# Patient Record
Sex: Female | Born: 1985 | Race: White | Hispanic: No | Smoking: Current every day smoker
Health system: Southern US, Community
[De-identification: ages and names within clinical notes are randomized; demographics above are authoritative.]

## PROBLEM LIST (undated history)

## (undated) ENCOUNTER — Inpatient Hospital Stay (HOSPITAL_COMMUNITY): Payer: Self-pay

## (undated) DIAGNOSIS — D682 Hereditary deficiency of other clotting factors: Secondary | ICD-10-CM

## (undated) DIAGNOSIS — I2699 Other pulmonary embolism without acute cor pulmonale: Secondary | ICD-10-CM

## (undated) DIAGNOSIS — IMO0002 Reserved for concepts with insufficient information to code with codable children: Secondary | ICD-10-CM

## (undated) DIAGNOSIS — N83209 Unspecified ovarian cyst, unspecified side: Secondary | ICD-10-CM

## (undated) DIAGNOSIS — O039 Complete or unspecified spontaneous abortion without complication: Secondary | ICD-10-CM

## (undated) DIAGNOSIS — F988 Other specified behavioral and emotional disorders with onset usually occurring in childhood and adolescence: Secondary | ICD-10-CM

## (undated) HISTORY — DX: Complete or unspecified spontaneous abortion without complication: O03.9

## (undated) HISTORY — DX: Unspecified ovarian cyst, unspecified side: N83.209

## (undated) HISTORY — PX: TONSILLECTOMY: SUR1361

## (undated) HISTORY — DX: Reserved for concepts with insufficient information to code with codable children: IMO0002

## (undated) HISTORY — PX: WISDOM TOOTH EXTRACTION: SHX21

## (undated) HISTORY — DX: Other specified behavioral and emotional disorders with onset usually occurring in childhood and adolescence: F98.8

---
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
## 2006-02-06 ENCOUNTER — Ambulatory Visit: Payer: Self-pay | Admitting: Cardiology

## 2006-03-13 ENCOUNTER — Ambulatory Visit: Payer: Self-pay | Admitting: Cardiology

## 2006-03-14 ENCOUNTER — Other Ambulatory Visit: Admission: RE | Admit: 2006-03-14 | Discharge: 2006-03-14 | Payer: Self-pay | Admitting: Gynecology

## 2006-09-12 ENCOUNTER — Emergency Department (HOSPITAL_COMMUNITY): Admission: EM | Admit: 2006-09-12 | Discharge: 2006-09-13 | Payer: Self-pay | Admitting: Emergency Medicine

## 2006-09-12 ENCOUNTER — Encounter (INDEPENDENT_AMBULATORY_CARE_PROVIDER_SITE_OTHER): Payer: Self-pay | Admitting: Emergency Medicine

## 2006-09-12 DIAGNOSIS — O039 Complete or unspecified spontaneous abortion without complication: Secondary | ICD-10-CM

## 2006-09-12 HISTORY — DX: Complete or unspecified spontaneous abortion without complication: O03.9

## 2007-01-03 ENCOUNTER — Other Ambulatory Visit: Admission: RE | Admit: 2007-01-03 | Discharge: 2007-01-03 | Payer: Self-pay | Admitting: Gynecology

## 2007-03-17 ENCOUNTER — Other Ambulatory Visit: Admission: RE | Admit: 2007-03-17 | Discharge: 2007-03-17 | Payer: Self-pay | Admitting: Gynecology

## 2007-07-17 ENCOUNTER — Inpatient Hospital Stay (HOSPITAL_COMMUNITY): Admission: AD | Admit: 2007-07-17 | Discharge: 2007-07-17 | Payer: Self-pay | Admitting: Obstetrics and Gynecology

## 2007-11-19 ENCOUNTER — Inpatient Hospital Stay (HOSPITAL_COMMUNITY): Admission: AD | Admit: 2007-11-19 | Discharge: 2007-11-21 | Payer: Self-pay | Admitting: Obstetrics and Gynecology

## 2007-11-28 ENCOUNTER — Encounter: Admission: RE | Admit: 2007-11-28 | Discharge: 2007-12-28 | Payer: Self-pay | Admitting: Obstetrics and Gynecology

## 2007-12-29 ENCOUNTER — Encounter: Admission: RE | Admit: 2007-12-29 | Discharge: 2008-01-27 | Payer: Self-pay | Admitting: Obstetrics and Gynecology

## 2008-01-28 ENCOUNTER — Encounter: Admission: RE | Admit: 2008-01-28 | Discharge: 2008-02-27 | Payer: Self-pay | Admitting: Obstetrics and Gynecology

## 2008-02-28 ENCOUNTER — Encounter: Admission: RE | Admit: 2008-02-28 | Discharge: 2008-03-28 | Payer: Self-pay | Admitting: Obstetrics and Gynecology

## 2008-03-29 ENCOUNTER — Encounter: Admission: RE | Admit: 2008-03-29 | Discharge: 2008-04-28 | Payer: Self-pay | Admitting: Obstetrics and Gynecology

## 2008-04-29 ENCOUNTER — Encounter: Admission: RE | Admit: 2008-04-29 | Discharge: 2008-05-29 | Payer: Self-pay | Admitting: Obstetrics and Gynecology

## 2009-03-07 ENCOUNTER — Other Ambulatory Visit: Admission: RE | Admit: 2009-03-07 | Discharge: 2009-03-07 | Payer: Self-pay | Admitting: Gynecology

## 2009-03-07 ENCOUNTER — Ambulatory Visit: Payer: Self-pay | Admitting: Gynecology

## 2009-05-17 ENCOUNTER — Ambulatory Visit: Payer: Self-pay | Admitting: Gynecology

## 2009-07-13 ENCOUNTER — Ambulatory Visit: Payer: Self-pay | Admitting: Gynecology

## 2009-08-07 HISTORY — PX: AUGMENTATION MAMMAPLASTY: SUR837

## 2009-11-16 ENCOUNTER — Ambulatory Visit: Payer: Self-pay | Admitting: Gynecology

## 2009-11-16 HISTORY — PX: INCISE AND DRAIN ABCESS: PRO64

## 2009-11-17 ENCOUNTER — Ambulatory Visit: Payer: Self-pay | Admitting: Gynecology

## 2010-06-29 ENCOUNTER — Ambulatory Visit (INDEPENDENT_AMBULATORY_CARE_PROVIDER_SITE_OTHER): Payer: BC Managed Care – PPO | Admitting: Otolaryngology

## 2010-06-29 DIAGNOSIS — R070 Pain in throat: Secondary | ICD-10-CM

## 2010-07-27 ENCOUNTER — Ambulatory Visit (INDEPENDENT_AMBULATORY_CARE_PROVIDER_SITE_OTHER): Payer: BC Managed Care – PPO | Admitting: Otolaryngology

## 2010-08-25 NOTE — Letter (Signed)
February 06, 2006    Peggy [name], M.D.  [address]  [place], [place] [address]   RE:  Peggy, [name]  MRN:  [id number]  /  DOB:  April 06, 1986   Dear Peggy [name],   It was my pleasure evaluating Peggy [name] in the office today in consultation  at your request.  As you know, this nice young lady has had trouble with  chronic chest pain over recent months.  This has necessitated 3 emergency  department visits but no specific diagnosis has been established to date.  She describes the sudden onset of sharp, aching and pressure in the  epigastric/lower chest area with difficulty taking a deep breath.  She does  not have true air hunger.  There is no diaphoresis nor nausea.  She has no  additional GI symptoms.  The symptoms generally fade without intervention.  She has been treated with analgesics in the emergency department, often  without benefit.   She has no history of previous cardiac or GI evaluation.  She has been tried  on a number of medications but does not recall the names.  She started  ranitidine a week ago but had recurrence of symptoms on Saturday.  There is  no relationship to exertion.  She previously had symptoms at night, but  these have resolved.  There is no definite relationship to meals.  She does  derive benefit from eating.  There was some thought that caffeine might be a  precipitant.   PAST MEDICAL HISTORY:  Benign.  She has never been hospitalized.  She has  never undergone surgery.  She has no chronic medical illnesses.   She has no known allergies.   Her only medication other than ranitidine a hormonal ring for contraception.   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  She smokes 1/2 pack of cigarettes per day and  denies excessive alcohol or illicit drugs.   FAMILY HISTORY:  Benign.  There is no coronary disease among first-degree  relatives.  There is no history of congenital heart  disease.   REVIEW OF SYSTEMS:  Generally negative.  She has occasional dizziness.  She  notes rare palpitations.  She has stable weight and appetite.   EXAM:  Trim, pleasant young woman in no acute distress.  The weight is 140 pounds.  Blood pressure 115/75.  Heart rate 85 and  regular.  Respirations 16.  HEENT:  Anicteric sclerae; normal lids and conjunctivae.  NECK:  No jugular venous distention; normal carotid upstrokes without  bruits.  ENDOCRINE:  No thyromegaly.  HEMATOPOIETIC:  No adenopathy.  LUNGS:  Clear.  CARDIAC:  Normal first and second heart sounds.  ABDOMEN:  Soft and with minimal epigastric tenderness; normal bowel sounds;  no organomegaly; no masses.  EXTREMITIES:  Normal distal pulses;  no edema.  NEUROMUSCULAR:  Symmetric strength and tone.   EKG:  Normal sinus rhythm; within normal limits.  When compared to a prior  tracing of 1 week ago, shallow T-wave inversions are no longer present.   Emergency department records were obtained and reviewed.  There was no  information regarding her most recent visit.  She was seen in 2006 with  chest discomfort and back pain.  She has not had an EKG in the emergency  department nor any specific testing.   IMPRESSION:  Peggy [name] has benign examination and electrocardiogram.  Her  symptoms are not highly suggestive  of a cardiac etiology.  Obviously, she  has no risks for atherosclerotic  disease.  The etiology of these episodes is more likely gastrointestinal  than cardiac.  She will complete a 47-month course of ranitidine.  If that is  ineffective, she will switch to AcipHex 20 mg b.i.d.  I will reevaluate her  symptoms in 5 weeks.  If she continues to have problems, it might be  worthwhile to obtain gastrointestinal consultation and testing.    Sincerely,      Peggy Friends. Dietrich Pates, MD, [facility]  Electronically Signed    RMR/MedQ  DD: 02/06/2006  DT: 02/06/2006  Job #: [id number]

## 2010-08-25 NOTE — Letter (Signed)
March 13, 2006    Peggy [name], M.D.  [address] [place] Peggy [name], [place] [address]   RE:  Peggy, [name]  MRN:  [id number]  /  DOB:  [birth year]   Dear Peggy [name]:   Peggy [name] returns to the office for continued assessment and treatment  of chest discomfort.  Since her last visit, she has had only 1 episode  lasting approximately 10 minutes.  This was epigastric, stabbing in  quality, and had a minor pleuritic component.  Her current medications  include her contraceptive ring and AcipHex 40 mg b.i.d.   Her exam and EKG remain benign.   IMPRESSION:  Peggy [name] is doing well with treatment for presumed  gastroesophageal reflux disease.  Her dose of AcipHex will be reduced to  40 mg and taken at night, since she experiences mild nausea after she  takes this medication.  I will be happy to see her as needed if she  experiences additional problems with which I can assist.  Thanks for  sending this nice young lady to me.  Influenza vaccine was administered  at her request.    Sincerely,      Peggy Friends. Dietrich Pates, MD, [facility]  Electronically Signed    RMR/MedQ  DD: 03/13/2006  DT: 03/13/2006  Job #: [id number]

## 2011-01-02 LAB — URINALYSIS, ROUTINE W REFLEX MICROSCOPIC
Bilirubin Urine: NEGATIVE
Glucose, UA: 250 — AB
Hgb urine dipstick: NEGATIVE
Ketones, ur: NEGATIVE
Nitrite: NEGATIVE
Protein, ur: NEGATIVE
Specific Gravity, Urine: 1.01
Urobilinogen, UA: 0.2
pH: 6.5

## 2011-01-02 LAB — URINE MICROSCOPIC-ADD ON

## 2011-01-05 LAB — CBC
HCT: 26.9 — ABNORMAL LOW
HCT: 33.9 — ABNORMAL LOW
Hemoglobin: 11.1 — ABNORMAL LOW
Hemoglobin: 8.9 — ABNORMAL LOW
MCHC: 32.8
MCHC: 33
MCV: 84.2
MCV: 85.2
Platelets: 233
Platelets: 276
RBC: 3.16 — ABNORMAL LOW
RBC: 4.02
RDW: 14.9
RDW: 15.1
WBC: 14 — ABNORMAL HIGH
WBC: 14.3 — ABNORMAL HIGH

## 2011-01-05 LAB — RPR: RPR Ser Ql: NONREACTIVE

## 2011-01-25 LAB — I-STAT 8, (EC8 V) (CONVERTED LAB)
Acid-base deficit: 2
BUN: 7
Bicarbonate: 22.5
Chloride: 107
Glucose, Bld: 99
HCT: 41
Hemoglobin: 13.9
Operator id: 132501
Potassium: 3.7
Sodium: 138
TCO2: 24
pCO2, Ven: 37.6 — ABNORMAL LOW
pH, Ven: 7.385 — ABNORMAL HIGH

## 2011-01-25 LAB — HCG, QUANTITATIVE, PREGNANCY: hCG, Beta Chain, Quant, S: 3942 — ABNORMAL HIGH

## 2011-01-25 LAB — PREGNANCY, URINE: Preg Test, Ur: POSITIVE

## 2011-01-25 LAB — ABO/RH: ABO/RH(D): B POS

## 2011-09-13 ENCOUNTER — Ambulatory Visit: Payer: BC Managed Care – PPO | Admitting: Gynecology

## 2011-09-19 ENCOUNTER — Other Ambulatory Visit (HOSPITAL_COMMUNITY)
Admission: RE | Admit: 2011-09-19 | Discharge: 2011-09-19 | Disposition: A | Discharge status: Home / Self Care | Payer: BC Managed Care – PPO | Source: Ambulatory Visit | Attending: Gynecology | Admitting: Gynecology

## 2011-09-19 ENCOUNTER — Ambulatory Visit (INDEPENDENT_AMBULATORY_CARE_PROVIDER_SITE_OTHER): Payer: BC Managed Care – PPO | Admitting: Gynecology

## 2011-09-19 ENCOUNTER — Encounter: Payer: Self-pay | Admitting: Gynecology

## 2011-09-19 VITALS — BP 100/60 | Ht 71.0 in | Wt 136.0 lb

## 2011-09-19 DIAGNOSIS — B9689 Other specified bacterial agents as the cause of diseases classified elsewhere: Secondary | ICD-10-CM

## 2011-09-19 DIAGNOSIS — N760 Acute vaginitis: Secondary | ICD-10-CM

## 2011-09-19 DIAGNOSIS — Z01419 Encounter for gynecological examination (general) (routine) without abnormal findings: Secondary | ICD-10-CM

## 2011-09-19 DIAGNOSIS — A499 Bacterial infection, unspecified: Secondary | ICD-10-CM

## 2011-09-19 DIAGNOSIS — N930 Postcoital and contact bleeding: Secondary | ICD-10-CM

## 2011-09-19 DIAGNOSIS — F988 Other specified behavioral and emotional disorders with onset usually occurring in childhood and adolescence: Secondary | ICD-10-CM | POA: Insufficient documentation

## 2011-09-19 LAB — WET PREP FOR TRICH, YEAST, CLUE
Clue Cells Wet Prep HPF POC: NONE SEEN
Trich, Wet Prep: NONE SEEN
Yeast Wet Prep HPF POC: NONE SEEN

## 2011-09-19 MED ORDER — METRONIDAZOLE 500 MG PO TABS: 500.0000 mg | ORAL_TABLET | Freq: Two times a day (BID) | ORAL | Status: AC

## 2011-09-19 NOTE — Patient Instructions (Signed)
Take Flagyl medication as prescribed.  Consider Stop Smoking.  Help is available at [facility] smoking cessation program @ www.[place].com or (313)260-7014. OR 1-800-QUIT-NOW 804 810 4118) for free smoking cessation counseling.   Smoking Hazards Smoking cigarettes is extremely bad for your health. Tobacco smoke has over 200 known poisons in it. There are over 60 chemicals in tobacco smoke that cause cancer. Some of the chemicals found in cigarette smoke include:  Cyanide.  Benzene.  Formaldehyde.  Methanol (wood alcohol).  Acetylene (fuel used in welding torches).  Ammonia.  Cigarette smoke also contains the poisonous gases nitrogen oxide and carbon monoxide.  Cigarette smokers have an increased risk of many serious medical problems, including: Lung cancer.  Lung disease (such as pneumonia, bronchitis, and emphysema).  Heart attack and chest pain due to the heart not getting enough oxygen (angina).  Heart disease and peripheral blood vessel disease.  Hypertension.  Stroke.  Oral cancer (cancer of the lip, mouth, or voice box).  Bladder cancer.  Pancreatic cancer.  Cervical cancer.  Pregnancy complications, including premature birth.  Low birthweight babies.  Early menopause.  Lower estrogen level for women.  Infertility.  Facial wrinkles.  Blindness.  Increased risk of broken bones (fractures).  Senile dementia.  Stillbirths and smaller newborn babies, birth defects, and genetic damage to sperm.  Stomach ulcers and internal bleeding.  Children of smokers have an increased risk of the following, because of secondhand smoke exposure:  Sudden infant death syndrome (SIDS).  Respiratory infections.  Lung cancer.  Heart disease.  Ear infections.  Smoking causes approximately: 90% of all lung cancer deaths in men.  80% of all lung cancer deaths in women.  90% of deaths from chronic obstructive lung disease.  Compared with nonsmokers, smoking increases the risk  of: Coronary heart disease by 2 to 4 times.  Stroke by 2 to 4 times.  Men developing lung cancer by 23 times.  Women developing lung cancer by 13 times.  Dying from chronic obstructive lung diseases by 12 times.  Someone who smokes 2 packs a day loses about 8 years of his or her life. Even smoking lightly shortens your life expectancy by several years. You can greatly reduce the risk of medical problems for you and your family by stopping now. Smoking is the most preventable cause of death and disease in our society. Within days of quitting smoking, your circulation returns to normal, you decrease the risk of having a heart attack, and your lung capacity improves. There may be some increased phlegm in the first few days after quitting, and it may take months for your lungs to clear up completely. Quitting for 10 years cuts your lung cancer risk to almost that of a nonsmoker. WHY IS SMOKING ADDICTIVE? Nicotine is the chemical agent in tobacco that is capable of causing addiction or dependence.  When you smoke and inhale, nicotine is absorbed rapidly into the bloodstream through your lungs. Nicotine absorbed through the lungs is capable of creating a powerful addiction. Both inhaled and non-inhaled nicotine may be addictive.  Addiction studies of cigarettes and spit tobacco show that addiction to nicotine occurs mainly during the teen years, when young people begin using tobacco products.  WHAT ARE THE BENEFITS OF QUITTING?  There are many health benefits to quitting smoking.  Likelihood of developing cancer and heart disease decreases. Health improvements are seen almost immediately.  Blood pressure, pulse rate, and breathing patterns start returning to normal soon after quitting.  People who quit may see  an improvement in their overall quality of life.  Some people choose to quit all at once. Other options include nicotine replacement products, such as patches, gum, and nasal sprays. Do not use these  products without first checking with your caregiver. QUITTING SMOKING It is not easy to quit smoking. Nicotine is addicting, and longtime habits are hard to change. To start, you can write down all your reasons for quitting, tell your family and friends you want to quit, and ask for their help. Throw your cigarettes away, chew gum or cinnamon sticks, keep your hands busy, and drink extra water or juice. Go for walks and practice deep breathing to relax. Think of all the money you are saving: around $1,000 a year, for the average pack-a-day smoker. Nicotine patches and gum have been shown to improve success at efforts to stop smoking. Zyban (bupropion) is an anti-depressant drug that can be prescribed to reduce nicotine withdrawal symptoms and to suppress the urge to smoke. Smoking is an addiction with both physical and psychological effects. Joining a stop-smoking support group can help you cope with the emotional issues. For more information and advice on programs to stop smoking, call your doctor, your local hospital, or these organizations: American Lung Association - 1-800-LUNGUSA  American Cancer Society - 1-800-ACS-2345  Document Released: 05/03/2004 Document Revised: 12/06/2010 Document Reviewed: 01/05/2009 [facility] Patient Information 2012 [place], [place].  Smoking Cessation This document explains the best ways for you to quit smoking and new treatments to help. It lists new medicines that can double or triple your chances of quitting and quitting for good. It also considers ways to avoid relapses and concerns you may have about quitting, including weight gain. NICOTINE: A POWERFUL ADDICTION If you have tried to quit smoking, you know how hard it can be. It is hard because nicotine is a very addictive drug. For some people, it can be as addictive as heroin or cocaine. Usually, people make 2 or 3 tries, or more, before finally being able to quit. Each time you try to quit, you can learn about what  helps and what hurts. Quitting takes hard work and a lot of effort, but you can quit smoking. QUITTING SMOKING IS ONE OF THE MOST IMPORTANT THINGS YOU WILL EVER DO.  You will live longer, feel better, and live better.   The impact on your body of quitting smoking is felt almost immediately:   Within 20 minutes, blood pressure decreases. Pulse returns to its normal level.   After 8 hours, carbon monoxide levels in the blood return to normal. Oxygen level increases.   After 24 hours, chance of heart attack starts to decrease. Breath, hair, and body stop smelling like smoke.   After 48 hours, damaged nerve endings begin to recover. Sense of taste and smell improve.   After 72 hours, the body is virtually free of nicotine. Bronchial tubes relax and breathing becomes easier.   After 2 to 12 weeks, lungs can hold more air. Exercise becomes easier and circulation improves.   Quitting will reduce your risk of having a heart attack, stroke, cancer, or lung disease:   After 1 year, the risk of coronary heart disease is cut in half.   After 5 years, the risk of stroke falls to the same as a nonsmoker.   After 10 years, the risk of lung cancer is cut in half and the risk of other cancers decreases significantly.   After 15 years, the risk of coronary heart disease drops, usually  to the level of a nonsmoker.   If you are pregnant, quitting smoking will improve your chances of having a healthy baby.   The people you live with, especially your children, will be healthier.   You will have extra money to spend on things other than cigarettes.  FIVE KEYS TO QUITTING Studies have shown that these 5 steps will help you quit smoking and quit for good. You have the best chances of quitting if you use them together: 1. Get ready.  2. Get support and encouragement.  3. Learn new skills and behaviors.  4. Get medicine to reduce your nicotine addiction and use it correctly.  5. Be prepared for relapse  or difficult situations. Be determined to continue trying to quit, even if you do not succeed at first.  1. GET READY  Set a quit date.   Change your environment.   Get rid of ALL cigarettes, ashtrays, matches, and lighters in your home, car, and place of work.   Do not let people smoke in your home.   Review your past attempts to quit. Think about what worked and what did not.   Once you quit, do not smoke. NOT EVEN A PUFF!  2. GET SUPPORT AND ENCOURAGEMENT Studies have shown that you have a better chance of being successful if you have help. You can get support in many ways.  Tell your family, friends, and coworkers that you are going to quit and need their support. Ask them not to smoke around you.   Talk to your caregivers (doctor, dentist, nurse, pharmacist, psychologist, and/or smoking counselor).   Get individual, group, or telephone counseling and support. The more counseling you have, the better your chances are of quitting. Programs are available at [employer] and health centers. Call your local health department for information about programs in your area.   Spiritual beliefs and practices may help some smokers quit.   Quit meters are Photographer that keep track of quit statistics, such as amount of "quit-time," cigarettes not smoked, and money saved.   Many smokers find one or more of the many self-help books available useful in helping them quit and stay off tobacco.  3. LEARN NEW SKILLS AND BEHAVIORS  Try to distract yourself from urges to smoke. Talk to someone, go for a walk, or occupy your time with a task.   When you first try to quit, change your routine. Take a different route to work. Drink tea instead of coffee. Eat breakfast in a different place.   Do something to reduce your stress. Take a hot bath, exercise, or read a book.   Plan something enjoyable to do every day. Reward yourself for not smoking.   Explore  interactive web-based programs that specialize in helping you quit.  4. GET MEDICINE AND USE IT CORRECTLY Medicines can help you stop smoking and decrease the urge to smoke. Combining medicine with the above behavioral methods and support can quadruple your chances of successfully quitting smoking. The U.S. Food and Drug Administration (FDA) has approved 7 medicines to help you quit smoking. These medicines fall into 3 categories.  Nicotine replacement therapy (delivers nicotine to your body without the negative effects and risks of smoking):   Nicotine gum: Available over-the-counter.   Nicotine lozenges: Available over-the-counter.   Nicotine inhaler: Available by prescription.   Nicotine nasal spray: Available by prescription.   Nicotine skin patches (transdermal): Available by prescription and over-the-counter.   Antidepressant medicine (  helps people abstain from smoking, but how this works is unknown):   Bupropion sustained-release (SR) tablets: Available by prescription.   Nicotinic receptor partial agonist (simulates the effect of nicotine in your brain):   Varenicline tartrate tablets: Available by prescription.   Ask your caregiver for advice about which medicines to use and how to use them. Carefully read the information on the package.   Everyone who is trying to quit may benefit from using a medicine. If you are pregnant or trying to become pregnant, nursing an infant, you are under age 18, or you smoke fewer than 10 cigarettes per day, talk to your caregiver before taking any nicotine replacement medicines.   You should stop using a nicotine replacement product and call your caregiver if you experience nausea, dizziness, weakness, vomiting, fast or irregular heartbeat, mouth problems with the lozenge or gum, or redness or swelling of the skin around the patch that does not go away.   Do not use any other product containing nicotine while using a nicotine replacement  product.   Talk to your caregiver before using these products if you have diabetes, heart disease, asthma, stomach ulcers, you had a recent heart attack, you have high blood pressure that is not controlled with medicine, a history of irregular heartbeat, or you have been prescribed medicine to help you quit smoking.  5. BE PREPARED FOR RELAPSE OR DIFFICULT SITUATIONS  Most relapses occur within the first 3 months after quitting. Do not be discouraged if you start smoking again. Remember, most people try several times before they finally quit.   You may have symptoms of withdrawal because your body is used to nicotine. You may crave cigarettes, be irritable, feel very hungry, cough often, get headaches, or have difficulty concentrating.   The withdrawal symptoms are only temporary. They are strongest when you first quit, but they will go away within 10 to 14 days.  Here are some difficult situations to watch for:  Alcohol. Avoid drinking alcohol. Drinking lowers your chances of successfully quitting.   Caffeine. Try to reduce the amount of caffeine you consume. It also lowers your chances of successfully quitting.   Other smokers. Being around smoking can make you want to smoke. Avoid smokers.   Weight gain. Many smokers will gain weight when they quit, usually less than 10 pounds. Eat a healthy diet and stay active. Do not let weight gain distract you from your main goal, quitting smoking. Some medicines that help you quit smoking may also help delay weight gain. You can always lose the weight gained after you quit.   Bad mood or depression. There are a lot of ways to improve your mood other than smoking.  If you are having problems with any of these situations, talk to your caregiver. SPECIAL SITUATIONS AND CONDITIONS Studies suggest that everyone can quit smoking. Your situation or condition can give you a special reason to quit.  Pregnant women/new mothers: By quitting, you protect your  baby's health and your own.   Hospitalized patients: By quitting, you reduce health problems and help healing.   Heart attack patients: By quitting, you reduce your risk of a second heart attack.   Lung, head, and neck cancer patients: By quitting, you reduce your chance of a second cancer.   Parents of children and adolescents: By quitting, you protect your children from illnesses caused by secondhand smoke.  QUESTIONS TO THINK ABOUT Think about the following questions before you try to stop smoking. You may   want to talk about your answers with your caregiver.  Why do you want to quit?   If you tried to quit in the past, what helped and what did not?   What will be the most difficult situations for you after you quit? How will you plan to handle them?   Who can help you through the tough times? Your family? Friends? Caregiver?   What pleasures do you get from smoking? What ways can you still get pleasure if you quit?  Here are some questions to ask your caregiver:  How can you help me to be successful at quitting?   What medicine do you think would be best for me and how should I take it?   What should I do if I need more help?   What is smoking withdrawal like? How can I get information on withdrawal?  Quitting takes hard work and a lot of effort, but you can quit smoking. FOR MORE INFORMATION  Smokefree.gov (http://www.davis-sullivan.com/) provides free, accurate, evidence-based information and professional assistance to help support the immediate and long-term needs of people trying to quit smoking. Document Released: 03/20/2001 Document Revised: 12/06/2010 Document Reviewed: 01/10/2009 [facility] Patient Information 2012 [place], [place].

## 2011-09-19 NOTE — Progress Notes (Signed)
VERSA CRATON 03/07/1986 [id number]        26 y.o.  for annual exam.  Several issues noted below.  Past medical history,surgical history, medications, allergies, family history and social history were all reviewed and documented in the EPIC chart. ROS:  Was performed and pertinent positives and negatives are included in the history.  Exam: [name] chaperone present Filed Vitals:   09/19/11 1128  BP: 100/60   General appearance  Normal Skin grossly normal, Implanon rod palpated upper left inner arm Head/Neck normal with no cervical or supraclavicular adenopathy thyroid normal Lungs  clear Cardiac RR, without RMG Abdominal  soft, nontender, without masses, organomegaly or hernia Breasts  examined lying and sitting without masses, retractions, discharge or axillary adenopathy. Bilateral augmentation noted. Pelvic  Ext/BUS/vagina  normal   Cervix  normal Pap, GC Chlamydia, wet prep  Uterus  axial, normal size, shape and contour, midline and mobile nontender   Adnexa  Without masses or tenderness    Anus and perineum  normal    Assessment/Plan:  26 y.o. female for annual exam.    1. Postcoital bleeding. Patient notes her last several months consistent post coital spotting. No pain or other symptoms.  Regular menses. Wet prep consistent with BV. We'll treat with Flagyl 500 mg twice a day, alcohol avoidance reviewed. GC Chlamydia/Pap smear done. We'll follow up if postcoital bleeding continues. 2. Pap smear.  History of low-grade SIL in 2008. Normal Pap smears 2008 and 2010. Pap smear done today. If normal then plan every 3 year screen per current screening guidelines. 3. Implanon. Patient had Implanon placed February 2011.  Exam with rod easily palpated.  Doing well overall due to be replaced next year. 4. Breast health. Bilateral implants 08/2009. SBE monthly reviewed. 5. Stop smoking encouraged. 6. Health maintenance. Baseline CBC ordered along with her GC Chlamydia, Pap,  urinalysis.    Dara Lords MD, 12:13 PM 09/19/2011

## 2011-09-20 LAB — URINALYSIS W MICROSCOPIC + REFLEX CULTURE
Bacteria, UA: NONE SEEN
Bilirubin Urine: NEGATIVE
Casts: NONE SEEN
Crystals: NONE SEEN
Glucose, UA: NEGATIVE mg/dL
Hgb urine dipstick: NEGATIVE
Ketones, ur: NEGATIVE mg/dL
Leukocytes, UA: NEGATIVE
Nitrite: NEGATIVE
Protein, ur: NEGATIVE mg/dL
Specific Gravity, Urine: 1.015 (ref 1.005–1.030)
Urobilinogen, UA: 0.2 mg/dL (ref 0.0–1.0)
pH: 5.5 (ref 5.0–8.0)

## 2011-09-20 LAB — GC/CHLAMYDIA PROBE AMP, GENITAL
Chlamydia, DNA Probe: NEGATIVE
GC Probe Amp, Genital: NEGATIVE

## 2012-05-19 ENCOUNTER — Ambulatory Visit (INDEPENDENT_AMBULATORY_CARE_PROVIDER_SITE_OTHER): Payer: BC Managed Care – PPO | Admitting: Gynecology

## 2012-05-19 ENCOUNTER — Encounter: Payer: Self-pay | Admitting: Gynecology

## 2012-05-19 DIAGNOSIS — Z3046 Encounter for surveillance of implantable subdermal contraceptive: Secondary | ICD-10-CM

## 2012-05-19 MED ORDER — ETONOGESTREL-ETHINYL ESTRADIOL 0.12-0.015 MG/24HR VA RING: VAGINAL_RING | VAGINAL | Status: DC

## 2012-05-19 NOTE — Patient Instructions (Addendum)
Start on NuvaRing as we discussed. Start on prenatal vitamins as we discussed.  Follow up end of the summer for your annual exam.

## 2012-05-19 NOTE — Progress Notes (Signed)
Patient presents to have her Implanon removed. Was placed February 2011. Planning pregnancy this coming fall. Had used NuvaRing in the past would like to restart now.  I reviewed the Implanon removal procedure and risks. Subsequently her left upper arm was examined with the Implanon rod easily palpable. The area overlying the distal end was cleansed with Betadine, infiltrated with 1% lidocaine and a small skin incision was made. The Implanon rod was moved into the incision site and grasped with a small forceps. The surrounding capsule was sharply incised and the intact Implanon rod easily slid out through the incision and was removed. It was shown to the patient and discarded. A Steri-Strip was placed over the skin incision and a pressure dressing applied. Postoperative instructions were given.  We reviewed contraceptive options and she wants to go ahead with the NuvaRing that she had used in the past. We gave her a sample ring and I refilled her prescription x6 months until her annual exam is due. I stressed the need to start on prenatal vitamins preconceptualy. We also reviewed the need to wean herself off of her medications prior to pregnancy attempt.

## 2012-09-22 ENCOUNTER — Encounter: Payer: BC Managed Care – PPO | Admitting: Gynecology

## 2012-12-26 ENCOUNTER — Encounter: Payer: BC Managed Care – PPO | Admitting: Gynecology

## 2013-01-02 ENCOUNTER — Encounter: Payer: Self-pay | Admitting: Gynecology

## 2013-02-12 ENCOUNTER — Other Ambulatory Visit: Payer: Self-pay

## 2013-03-24 LAB — OB RESULTS CONSOLE RPR: RPR: NONREACTIVE

## 2013-03-24 LAB — OB RESULTS CONSOLE RUBELLA ANTIBODY, IGM: Rubella: IMMUNE

## 2013-03-24 LAB — OB RESULTS CONSOLE HEPATITIS B SURFACE ANTIGEN: Hepatitis B Surface Ag: NEGATIVE

## 2013-03-24 LAB — OB RESULTS CONSOLE HIV ANTIBODY (ROUTINE TESTING): HIV: NONREACTIVE

## 2013-03-24 LAB — OB RESULTS CONSOLE GC/CHLAMYDIA
Chlamydia: NEGATIVE
GC PROBE AMP, GENITAL: NEGATIVE

## 2013-03-24 LAB — OB RESULTS CONSOLE ABO/RH: RH Type: POSITIVE

## 2013-03-24 LAB — OB RESULTS CONSOLE GBS: GBS: NEGATIVE

## 2013-03-24 LAB — OB RESULTS CONSOLE ANTIBODY SCREEN: ANTIBODY SCREEN: NEGATIVE

## 2013-04-09 NOTE — L&D Delivery Note (Signed)
Patient was C/C/+3 and pushed for 5 minutes with epidural.   NSVD female infant, Apgars 8,9, weight P.   The patient had a second degree episiotomy repaired with 2-0 vicryl R. Fundus was firm. EBL was expected. Placenta was delivered intact. Vagina was clear.  Baby was vigorous and doing skin to skin with mother.  Peggy [name] A

## 2013-05-20 ENCOUNTER — Other Ambulatory Visit: Payer: Self-pay

## 2013-05-22 ENCOUNTER — Other Ambulatory Visit (HOSPITAL_COMMUNITY): Payer: Self-pay | Admitting: Obstetrics and Gynecology

## 2013-05-22 DIAGNOSIS — Z3689 Encounter for other specified antenatal screening: Secondary | ICD-10-CM

## 2013-05-22 DIAGNOSIS — O43129 Velamentous insertion of umbilical cord, unspecified trimester: Secondary | ICD-10-CM

## 2013-05-28 ENCOUNTER — Ambulatory Visit (HOSPITAL_COMMUNITY): Admission: RE | Admit: 2013-05-28 | Payer: Medicaid Other | Source: Ambulatory Visit

## 2013-06-02 ENCOUNTER — Ambulatory Visit (HOSPITAL_COMMUNITY): Admission: RE | Admit: 2013-06-02 | Payer: Medicaid Other | Source: Ambulatory Visit

## 2013-06-05 ENCOUNTER — Ambulatory Visit (HOSPITAL_COMMUNITY)
Admission: RE | Admit: 2013-06-05 | Discharge: 2013-06-05 | Disposition: A | Discharge status: Home / Self Care | Payer: Medicaid Other | Source: Ambulatory Visit | Attending: Obstetrics and Gynecology | Admitting: Obstetrics and Gynecology

## 2013-06-05 ENCOUNTER — Encounter (HOSPITAL_COMMUNITY): Payer: Self-pay

## 2013-06-05 DIAGNOSIS — Z363 Encounter for antenatal screening for malformations: Secondary | ICD-10-CM | POA: Insufficient documentation

## 2013-06-05 DIAGNOSIS — O358XX0 Maternal care for other (suspected) fetal abnormality and damage, not applicable or unspecified: Secondary | ICD-10-CM | POA: Insufficient documentation

## 2013-06-05 DIAGNOSIS — O43129 Velamentous insertion of umbilical cord, unspecified trimester: Secondary | ICD-10-CM

## 2013-06-05 DIAGNOSIS — Z3689 Encounter for other specified antenatal screening: Secondary | ICD-10-CM

## 2013-06-05 DIAGNOSIS — Z1389 Encounter for screening for other disorder: Secondary | ICD-10-CM | POA: Insufficient documentation

## 2013-07-21 ENCOUNTER — Encounter (HOSPITAL_COMMUNITY): Payer: Self-pay

## 2013-07-21 ENCOUNTER — Inpatient Hospital Stay (HOSPITAL_COMMUNITY): Payer: Medicaid Other

## 2013-07-21 ENCOUNTER — Inpatient Hospital Stay (HOSPITAL_COMMUNITY)
Admission: AD | Admit: 2013-07-21 | Discharge: 2013-07-21 | Disposition: A | Discharge status: Home / Self Care | Payer: Medicaid Other | Source: Ambulatory Visit | Attending: Obstetrics and Gynecology | Admitting: Obstetrics and Gynecology

## 2013-07-21 DIAGNOSIS — N898 Other specified noninflammatory disorders of vagina: Secondary | ICD-10-CM | POA: Insufficient documentation

## 2013-07-21 DIAGNOSIS — R109 Unspecified abdominal pain: Secondary | ICD-10-CM | POA: Insufficient documentation

## 2013-07-21 DIAGNOSIS — N949 Unspecified condition associated with female genital organs and menstrual cycle: Secondary | ICD-10-CM | POA: Insufficient documentation

## 2013-07-21 DIAGNOSIS — D682 Hereditary deficiency of other clotting factors: Secondary | ICD-10-CM | POA: Insufficient documentation

## 2013-07-21 DIAGNOSIS — O26853 Spotting complicating pregnancy, third trimester: Secondary | ICD-10-CM

## 2013-07-21 DIAGNOSIS — O26859 Spotting complicating pregnancy, unspecified trimester: Secondary | ICD-10-CM | POA: Insufficient documentation

## 2013-07-21 HISTORY — DX: Other pulmonary embolism without acute cor pulmonale: I26.99

## 2013-07-21 HISTORY — DX: Hereditary deficiency of other clotting factors: D68.2

## 2013-07-21 LAB — URINALYSIS, ROUTINE W REFLEX MICROSCOPIC
Bilirubin Urine: NEGATIVE
GLUCOSE, UA: NEGATIVE mg/dL
Hgb urine dipstick: NEGATIVE
KETONES UR: NEGATIVE mg/dL
Leukocytes, UA: NEGATIVE
Nitrite: NEGATIVE
PH: 6.5 (ref 5.0–8.0)
Protein, ur: NEGATIVE mg/dL
Specific Gravity, Urine: 1.01 (ref 1.005–1.030)
Urobilinogen, UA: 0.2 mg/dL (ref 0.0–1.0)

## 2013-07-21 LAB — WET PREP, GENITAL
CLUE CELLS WET PREP: NONE SEEN
Trich, Wet Prep: NONE SEEN
Yeast Wet Prep HPF POC: NONE SEEN

## 2013-07-21 NOTE — MAU Note (Signed)
Patient states she has had dark red spotting in her panties several times today. No blood on tissue when wiping. States she has started having a lot of low pelvic pain that worse with standing and walking. Reports good fetal movement. Denies leaking.

## 2013-07-21 NOTE — Discharge Instructions (Signed)
Vaginal Bleeding During Pregnancy, Third Trimester A small amount of bleeding (spotting) from the vagina is relatively common in pregnancy. Various things can cause bleeding or spotting in pregnancy. Sometimes the bleeding is normal and is not a problem. However, bleeding during the third trimester can also be a sign of something serious for the mother and the baby. Be sure to tell your health care provider about any vaginal bleeding right away.  Some possible causes of vaginal bleeding during the third trimester include:   The placenta may be partially or completely covering the opening to the cervix (placenta previa).   The placenta may have separated from the uterus (abruption of the placenta).   There may be an infection or growth on the cervix.   You may be starting labor, called discharging of the mucus plug.   The placenta may grow into the muscle layer of the uterus (placenta accreta).  HOME CARE INSTRUCTIONS  Watch your condition for any changes. The following actions may help to lessen any discomfort you are feeling:   Follow your health care provider's instructions for limiting your activity. If your health care provider orders bed rest, you may need to stay in bed and only get up to use the bathroom. However, your health care provider may allow you to continue light activity.  If needed, make plans for someone to help with your regular activities and responsibilities while you are on bed rest.  Keep track of the number of pads you use each day, how often you change pads, and how soaked (saturated) they are. Write this down.  Do not use tampons. Do not douche.  Do not have sexual intercourse or orgasms until approved by your health care provider.  Follow your health care provider's advice about lifting, driving, and physical activities.  If you pass any tissue from your vagina, save the tissue so you can show it to your health care provider.   Only take over-the-counter  or prescription medicines as directed by your health care provider.  Do not take aspirin because it can make you bleed.   Keep all follow-up appointments as directed by your health care provider. SEEK MEDICAL CARE IF:  You have any vaginal bleeding during any part of your pregnancy.  You have cramps or labor pains. SEEK IMMEDIATE MEDICAL CARE IF:   You have severe cramps or pain in your back or belly (abdomen).  You have a fever, not controlled by medicine.  You have chills.  You have a gush of fluid from the vagina.  You pass large clots or tissue from your vagina.  Your bleeding increases.  You feel lightheaded or weak.  You pass out.  You feel less movement or no movement of the baby.  MAKE SURE YOU:  Understand these instructions.  Will watch your condition.  Will get help right away if you are not doing well or get worse. Document Released: 06/16/2002 Document Revised: 01/14/2013 Document Reviewed: 12/01/2012 [employer] Patient Information 2014 [employer].  

## 2013-07-21 NOTE — MAU Provider Note (Signed)
Chief Complaint:  Vaginal Discharge and Pelvic Pain   First Provider Initiated Contact with Patient 07/21/13 1731      HPI: Peggy [name] is a 28 y.o. G3P0011 at 46101w1d who presents to maternity admissions reporting sharp pain bilaterally in inguinal areas with movement today and light red spotting enough to require a panty liner starting after the pain.  Her pain and bleeding have resolved by arrival in MAU.  She reports good fetal movement, denies LOF, vaginal itching/burning, urinary symptoms, h/a, dizziness, n/v, or fever/chills.     Past Medical History: Past Medical History  Diagnosis Date  . Labial abscess 11-17-09  . LGSIL (low grade squamous intraepithelial dysplasia) 12-07 &3-08    C&B 3-08/CONDYLOMA TREATED 06-19-06  . Complete miscarriage 09-12-06  . ADD (attention deficit disorder)   . Factor V deficiency   . Pulmonary embolus     Past obstetric history: OB History  Gravida Para Term Preterm AB SAB TAB Ectopic Multiple Living  3 1 1  1 1    1     # Outcome Date GA Lbr Len/2nd Weight Sex Delivery Anes PTL Lv  3 CUR           2 SAB           1 TRM               Past Surgical History: Past Surgical History  Procedure Laterality Date  . Incise and drain abcess  11-16-09    LEFT LABIA  . Augmentation mammaplasty  08/2009  . Tonsillectomy    . Wisdom tooth extraction      Family History: Family History  Problem Relation Age of Onset  . Cancer Maternal Grandmother     skin  . Diabetes Maternal Grandmother   . Hypertension Maternal Grandfather   . Breast cancer Paternal Grandmother   . Cancer Paternal Grandfather     ? colon    Social History: History  Substance Use Topics  . Smoking status: Current Some Day Smoker  . Smokeless tobacco: Never Used  . Alcohol Use: Yes     Comment: once a week    Allergies:  Allergies  Allergen Reactions  . Caffeine Anaphylaxis  . Peanut Butter Flavor Anaphylaxis    Meds:  Prescriptions prior to admission   Medication Sig Dispense Refill  . enoxaparin (LOVENOX) 40 MG/0.4ML injection Inject 40 mg into the skin daily.      . folic acid (FOLVITE) 800 MCG tablet Take 1,600 mcg by mouth daily.        ROS: Pertinent findings in history of present illness.  Physical Exam  Blood pressure 116/78, pulse 100, temperature 98.5 F (36.9 C), temperature source Oral, resp. rate 16, height 5\' 9"  (1.753 m), weight 78.2 kg (172 lb 6.4 oz), last menstrual period 12/29/2012, SpO2 100.00%. GENERAL: Well-developed, well-nourished female in no acute distress.  HEENT: normocephalic HEART: normal rate RESP: normal effort ABDOMEN: Soft, non-tender, gravid appropriate for gestational age EXTREMITIES: Nontender, no edema NEURO: alert and oriented Pelvic exam: Cervix pink, visually closed, without lesion, scant white creamy discharge, vaginal walls and external genitalia normal  Dilation: Closed Effacement (%): Thick Cervical Position: Posterior Station:  (high) Exam by:: L. Leftwich-Kirby CNM  FHT:  Baseline 145 , moderate variability, accelerations present, no decelerations Contractions: none on toco or to palpation   Labs: Results for orders placed during the hospital encounter of 07/21/13 (from the past 24 hour(s))  URINALYSIS, ROUTINE W REFLEX MICROSCOPIC  Status: Abnormal   Collection Time    07/21/13  3:55 PM      Result Value Ref Range   Color, Urine STRAW (*) YELLOW   APPearance CLEAR  CLEAR   Specific Gravity, Urine 1.010  1.005 - 1.030   pH 6.5  5.0 - 8.0   Glucose, UA NEGATIVE  NEGATIVE mg/dL   Hgb urine dipstick NEGATIVE  NEGATIVE   Bilirubin Urine NEGATIVE  NEGATIVE   Ketones, ur NEGATIVE  NEGATIVE mg/dL   Protein, ur NEGATIVE  NEGATIVE mg/dL   Urobilinogen, UA 0.2  0.0 - 1.0 mg/dL   Nitrite NEGATIVE  NEGATIVE   Leukocytes, UA NEGATIVE  NEGATIVE  WET PREP, GENITAL     Status: Abnormal   Collection Time    07/21/13  5:40 PM      Result Value Ref Range   Yeast Wet Prep HPF POC NONE  SEEN  NONE SEEN   Trich, Wet Prep NONE SEEN  NONE SEEN   Clue Cells Wet Prep HPF POC NONE SEEN  NONE SEEN   WBC, Wet Prep HPF POC MANY (*) NONE SEEN    Imaging:  Preliminary report with normal AFI, FHT, no placenta previa or abruption noted, cervical length wnl   Assessment: 1. Spotting complicating pregnancy in third trimester   2. Round ligament pain     Plan: Consult Dr Dareen PianoAnderson Discharge home PTL precautions, bleeding precautions, fetal kick counts F/U in office this week Return to MAU as needed      Follow-up Information   Follow up with [facility] Schedule an appointment as soon as possible for a visit in 1 week.   Contact information:   [address] [place] [address] [phone]       Medication List         enoxaparin 40 MG/0.4ML injection  Commonly known as:  LOVENOX  Inject 40 mg into the skin daily.     folic acid 800 MCG tablet  Commonly known as:  FOLVITE  Take 1,600 mcg by mouth daily.        Sharen CounterLisa Leftwich-Kirby Certified Nurse-Midwife 07/21/2013 7:35 PM

## 2013-08-13 ENCOUNTER — Other Ambulatory Visit: Payer: Self-pay | Admitting: Obstetrics and Gynecology

## 2013-08-13 ENCOUNTER — Ambulatory Visit
Admission: RE | Admit: 2013-08-13 | Discharge: 2013-08-13 | Disposition: A | Discharge status: Home / Self Care | Payer: Medicaid Other | Source: Ambulatory Visit | Attending: Obstetrics and Gynecology | Admitting: Obstetrics and Gynecology

## 2013-08-13 ENCOUNTER — Ambulatory Visit: Admission: RE | Admit: 2013-08-13 | Payer: Medicaid Other | Source: Ambulatory Visit

## 2013-08-13 DIAGNOSIS — R079 Chest pain, unspecified: Secondary | ICD-10-CM

## 2013-08-13 DIAGNOSIS — R0602 Shortness of breath: Secondary | ICD-10-CM

## 2013-08-13 MED ORDER — IOHEXOL 350 MG/ML SOLN
100.0000 mL | Freq: Once | INTRAVENOUS | Status: AC | PRN
  Administered 2013-08-13: 100 mL via INTRAVENOUS

## 2013-09-11 LAB — OB RESULTS CONSOLE GBS: GBS: POSITIVE

## 2013-09-29 ENCOUNTER — Encounter (HOSPITAL_COMMUNITY): Payer: Self-pay | Admitting: *Deleted

## 2013-09-29 ENCOUNTER — Telehealth (HOSPITAL_COMMUNITY): Payer: Self-pay | Admitting: *Deleted

## 2013-09-29 NOTE — Telephone Encounter (Signed)
Preadmission screen  

## 2013-10-04 ENCOUNTER — Inpatient Hospital Stay (HOSPITAL_COMMUNITY)
Admission: AD | Admit: 2013-10-04 | Discharge: 2013-10-04 | Disposition: A | Discharge status: Home / Self Care | Payer: Medicaid Other | Attending: Obstetrics and Gynecology | Admitting: Obstetrics and Gynecology

## 2013-10-04 ENCOUNTER — Encounter (HOSPITAL_COMMUNITY): Payer: Self-pay

## 2013-10-04 ENCOUNTER — Inpatient Hospital Stay (HOSPITAL_COMMUNITY)
Admission: RE | Admit: 2013-10-04 | Discharge: 2013-10-06 | DRG: 775 | Disposition: A | Discharge status: Home / Self Care | Payer: Medicaid Other | Source: Ambulatory Visit | Attending: Obstetrics and Gynecology | Admitting: Obstetrics and Gynecology

## 2013-10-04 ENCOUNTER — Inpatient Hospital Stay (HOSPITAL_COMMUNITY): Payer: Medicaid Other | Admitting: Anesthesiology

## 2013-10-04 ENCOUNTER — Encounter (HOSPITAL_COMMUNITY): Payer: Medicaid Other | Admitting: Anesthesiology

## 2013-10-04 DIAGNOSIS — Z22330 Carrier of Group B streptococcus: Secondary | ICD-10-CM

## 2013-10-04 DIAGNOSIS — O9989 Other specified diseases and conditions complicating pregnancy, childbirth and the puerperium: Secondary | ICD-10-CM

## 2013-10-04 DIAGNOSIS — O9912 Other diseases of the blood and blood-forming organs and certain disorders involving the immune mechanism complicating childbirth: Principal | ICD-10-CM

## 2013-10-04 DIAGNOSIS — D689 Coagulation defect, unspecified: Principal | ICD-10-CM | POA: Diagnosis present

## 2013-10-04 DIAGNOSIS — Z3490 Encounter for supervision of normal pregnancy, unspecified, unspecified trimester: Secondary | ICD-10-CM

## 2013-10-04 DIAGNOSIS — D6859 Other primary thrombophilia: Secondary | ICD-10-CM | POA: Diagnosis present

## 2013-10-04 DIAGNOSIS — O99334 Smoking (tobacco) complicating childbirth: Secondary | ICD-10-CM | POA: Diagnosis present

## 2013-10-04 DIAGNOSIS — O99892 Other specified diseases and conditions complicating childbirth: Secondary | ICD-10-CM | POA: Diagnosis present

## 2013-10-04 DIAGNOSIS — Z86711 Personal history of pulmonary embolism: Secondary | ICD-10-CM

## 2013-10-04 LAB — CBC
HCT: 33 % — ABNORMAL LOW (ref 36.0–46.0)
HCT: 30 % — ABNORMAL LOW (ref 36.0–46.0)
HEMOGLOBIN: 10.7 g/dL — AB (ref 12.0–15.0)
HEMOGLOBIN: 9.9 g/dL — AB (ref 12.0–15.0)
MCH: 27.8 pg (ref 26.0–34.0)
MCH: 28.2 pg (ref 26.0–34.0)
MCHC: 32.4 g/dL (ref 30.0–36.0)
MCHC: 33 g/dL (ref 30.0–36.0)
MCV: 85.7 fL (ref 78.0–100.0)
MCV: 85.5 fL (ref 78.0–100.0)
Platelets: 243 10*3/uL (ref 150–400)
Platelets: 221 10*3/uL (ref 150–400)
RBC: 3.85 MIL/uL — ABNORMAL LOW (ref 3.87–5.11)
RBC: 3.51 MIL/uL — ABNORMAL LOW (ref 3.87–5.11)
RDW: 13.2 % (ref 11.5–15.5)
RDW: 13.2 % (ref 11.5–15.5)
WBC: 10.8 10*3/uL — ABNORMAL HIGH (ref 4.0–10.5)
WBC: 10.8 10*3/uL — ABNORMAL HIGH (ref 4.0–10.5)

## 2013-10-04 LAB — RPR

## 2013-10-04 LAB — CREATININE, SERUM: CREATININE: 0.61 mg/dL (ref 0.50–1.10)

## 2013-10-04 MED ORDER — SENNOSIDES-DOCUSATE SODIUM 8.6-50 MG PO TABS
2.0000 | ORAL_TABLET | ORAL | Status: DC
  Administered 2013-10-05 – 2013-10-06 (×2): 2 via ORAL
  Filled 2013-10-04 (×2): qty 2

## 2013-10-04 MED ORDER — ONDANSETRON HCL 4 MG PO TABS: 4.0000 mg | ORAL_TABLET | ORAL | Status: DC | PRN

## 2013-10-04 MED ORDER — LACTATED RINGERS IV SOLN: 500.0000 mL | Freq: Once | INTRAVENOUS | Status: DC

## 2013-10-04 MED ORDER — OXYCODONE-ACETAMINOPHEN 5-325 MG PO TABS
1.0000 | ORAL_TABLET | ORAL | Status: DC | PRN
Start: 2013-10-04 — End: 2013-10-06
  Administered 2013-10-05 – 2013-10-06 (×5): 1 via ORAL
  Filled 2013-10-04 (×5): qty 1

## 2013-10-04 MED ORDER — PHENYLEPHRINE 40 MCG/ML (10ML) SYRINGE FOR IV PUSH (FOR BLOOD PRESSURE SUPPORT)
80.0000 ug | PREFILLED_SYRINGE | INTRAVENOUS | Status: DC | PRN
  Filled 2013-10-04: qty 10

## 2013-10-04 MED ORDER — LANOLIN HYDROUS EX OINT: TOPICAL_OINTMENT | CUTANEOUS | Status: DC | PRN

## 2013-10-04 MED ORDER — OXYTOCIN 40 UNITS IN LACTATED RINGERS INFUSION - SIMPLE MED
62.5000 mL/h | INTRAVENOUS | Status: DC
  Filled 2013-10-04: qty 1000

## 2013-10-04 MED ORDER — EPHEDRINE 5 MG/ML INJ
10.0000 mg | INTRAVENOUS | Status: DC | PRN
  Filled 2013-10-04: qty 4

## 2013-10-04 MED ORDER — WITCH HAZEL-GLYCERIN EX PADS: 1.0000 "application " | MEDICATED_PAD | CUTANEOUS | Status: DC | PRN

## 2013-10-04 MED ORDER — SODIUM CHLORIDE 0.9 % IJ SOLN: 3.0000 mL | Freq: Two times a day (BID) | INTRAMUSCULAR | Status: DC

## 2013-10-04 MED ORDER — OXYCODONE-ACETAMINOPHEN 5-325 MG PO TABS: 1.0000 | ORAL_TABLET | ORAL | Status: DC | PRN

## 2013-10-04 MED ORDER — ZOLPIDEM TARTRATE 5 MG PO TABS: 5.0000 mg | ORAL_TABLET | Freq: Every evening | ORAL | Status: DC | PRN

## 2013-10-04 MED ORDER — IBUPROFEN 600 MG PO TABS: 600.0000 mg | ORAL_TABLET | Freq: Four times a day (QID) | ORAL | Status: DC | PRN

## 2013-10-04 MED ORDER — SODIUM CHLORIDE 0.9 % IV SOLN: 250.0000 mL | INTRAVENOUS | Status: DC | PRN

## 2013-10-04 MED ORDER — OXYTOCIN BOLUS FROM INFUSION: 500.0000 mL | INTRAVENOUS | Status: DC

## 2013-10-04 MED ORDER — MAGNESIUM HYDROXIDE 400 MG/5ML PO SUSP: 30.0000 mL | ORAL | Status: DC | PRN

## 2013-10-04 MED ORDER — PRENATAL MULTIVITAMIN CH
1.0000 | ORAL_TABLET | Freq: Every day | ORAL | Status: DC
  Administered 2013-10-05 – 2013-10-06 (×2): 1 via ORAL
  Filled 2013-10-04 (×2): qty 1

## 2013-10-04 MED ORDER — CITRIC ACID-SODIUM CITRATE 334-500 MG/5ML PO SOLN: 30.0000 mL | ORAL | Status: DC | PRN

## 2013-10-04 MED ORDER — OXYTOCIN 40 UNITS IN LACTATED RINGERS INFUSION - SIMPLE MED
1.0000 m[IU]/min | INTRAVENOUS | Status: DC
  Administered 2013-10-04: 2 m[IU]/min via INTRAVENOUS
  Filled 2013-10-04: qty 1000

## 2013-10-04 MED ORDER — BENZOCAINE-MENTHOL 20-0.5 % EX AERO
1.0000 "application " | INHALATION_SPRAY | CUTANEOUS | Status: DC | PRN
  Administered 2013-10-04 – 2013-10-06 (×4): 1 via TOPICAL
  Filled 2013-10-04 (×4): qty 56

## 2013-10-04 MED ORDER — FENTANYL 2.5 MCG/ML BUPIVACAINE 1/10 % EPIDURAL INFUSION (WH - ANES)
14.0000 mL/h | INTRAMUSCULAR | Status: DC | PRN
  Filled 2013-10-04: qty 125

## 2013-10-04 MED ORDER — METHYLERGONOVINE MALEATE 0.2 MG/ML IJ SOLN: 0.2000 mg | INTRAMUSCULAR | Status: DC | PRN

## 2013-10-04 MED ORDER — DIPHENHYDRAMINE HCL 25 MG PO CAPS: 25.0000 mg | ORAL_CAPSULE | Freq: Four times a day (QID) | ORAL | Status: DC | PRN

## 2013-10-04 MED ORDER — FLEET ENEMA 7-19 GM/118ML RE ENEM: 1.0000 | ENEMA | RECTAL | Status: DC | PRN

## 2013-10-04 MED ORDER — SODIUM CHLORIDE 0.9 % IJ SOLN: 3.0000 mL | INTRAMUSCULAR | Status: DC | PRN

## 2013-10-04 MED ORDER — ENOXAPARIN SODIUM 40 MG/0.4ML ~~LOC~~ SOLN
40.0000 mg | SUBCUTANEOUS | Status: DC
  Administered 2013-10-04 – 2013-10-05 (×2): 40 mg via SUBCUTANEOUS
  Filled 2013-10-04 (×3): qty 0.4

## 2013-10-04 MED ORDER — PHENYLEPHRINE 40 MCG/ML (10ML) SYRINGE FOR IV PUSH (FOR BLOOD PRESSURE SUPPORT): 80.0000 ug | PREFILLED_SYRINGE | INTRAVENOUS | Status: DC | PRN

## 2013-10-04 MED ORDER — FERROUS SULFATE 325 (65 FE) MG PO TABS
325.0000 mg | ORAL_TABLET | Freq: Two times a day (BID) | ORAL | Status: DC
  Administered 2013-10-04 – 2013-10-06 (×4): 325 mg via ORAL
  Filled 2013-10-04 (×5): qty 1

## 2013-10-04 MED ORDER — SIMETHICONE 80 MG PO CHEW: 80.0000 mg | CHEWABLE_TABLET | ORAL | Status: DC | PRN

## 2013-10-04 MED ORDER — LACTATED RINGERS IV SOLN
INTRAVENOUS | Status: DC
  Administered 2013-10-04: 08:00:00 via INTRAVENOUS

## 2013-10-04 MED ORDER — LACTATED RINGERS IV SOLN: 500.0000 mL | INTRAVENOUS | Status: DC | PRN

## 2013-10-04 MED ORDER — TETANUS-DIPHTH-ACELL PERTUSSIS 5-2.5-18.5 LF-MCG/0.5 IM SUSP
0.5000 mL | Freq: Once | INTRAMUSCULAR | Status: AC
  Administered 2013-10-05: 0.5 mL via INTRAMUSCULAR
  Filled 2013-10-04: qty 0.5

## 2013-10-04 MED ORDER — IBUPROFEN 800 MG PO TABS
800.0000 mg | ORAL_TABLET | Freq: Three times a day (TID) | ORAL | Status: DC
  Administered 2013-10-05 – 2013-10-06 (×5): 800 mg via ORAL
  Filled 2013-10-04 (×5): qty 1

## 2013-10-04 MED ORDER — FENTANYL 2.5 MCG/ML BUPIVACAINE 1/10 % EPIDURAL INFUSION (WH - ANES)
INTRAMUSCULAR | Status: DC | PRN
  Administered 2013-10-04: 14 mL/h via EPIDURAL

## 2013-10-04 MED ORDER — LIDOCAINE HCL (PF) 1 % IJ SOLN
30.0000 mL | INTRAMUSCULAR | Status: AC | PRN
  Administered 2013-10-04: 30 mL via SUBCUTANEOUS
  Filled 2013-10-04: qty 30

## 2013-10-04 MED ORDER — PENICILLIN G POTASSIUM 5000000 UNITS IJ SOLR
2.5000 10*6.[IU] | INTRAVENOUS | Status: DC
  Filled 2013-10-04 (×5): qty 2.5

## 2013-10-04 MED ORDER — TERBUTALINE SULFATE 1 MG/ML IJ SOLN: 0.2500 mg | Freq: Once | INTRAMUSCULAR | Status: DC | PRN

## 2013-10-04 MED ORDER — PENICILLIN G POTASSIUM 5000000 UNITS IJ SOLR
5.0000 10*6.[IU] | Freq: Once | INTRAVENOUS | Status: AC
  Administered 2013-10-04: 5 10*6.[IU] via INTRAVENOUS
  Filled 2013-10-04: qty 5

## 2013-10-04 MED ORDER — DIBUCAINE 1 % RE OINT: 1.0000 "application " | TOPICAL_OINTMENT | RECTAL | Status: DC | PRN

## 2013-10-04 MED ORDER — ACETAMINOPHEN 325 MG PO TABS: 650.0000 mg | ORAL_TABLET | ORAL | Status: DC | PRN

## 2013-10-04 MED ORDER — DIPHENHYDRAMINE HCL 50 MG/ML IJ SOLN: 12.5000 mg | INTRAMUSCULAR | Status: DC | PRN

## 2013-10-04 MED ORDER — ONDANSETRON HCL 4 MG/2ML IJ SOLN: 4.0000 mg | INTRAMUSCULAR | Status: DC | PRN

## 2013-10-04 MED ORDER — ONDANSETRON HCL 4 MG/2ML IJ SOLN: 4.0000 mg | Freq: Four times a day (QID) | INTRAMUSCULAR | Status: DC | PRN

## 2013-10-04 MED ORDER — LIDOCAINE HCL (PF) 1 % IJ SOLN
INTRAMUSCULAR | Status: DC | PRN
  Administered 2013-10-04: 8 mL
  Administered 2013-10-04: 9 mL

## 2013-10-04 MED ORDER — MEASLES, MUMPS & RUBELLA VAC ~~LOC~~ INJ
0.5000 mL | INJECTION | Freq: Once | SUBCUTANEOUS | Status: DC
  Filled 2013-10-04: qty 0.5

## 2013-10-04 MED ORDER — METHYLERGONOVINE MALEATE 0.2 MG PO TABS: 0.2000 mg | ORAL_TABLET | ORAL | Status: DC | PRN

## 2013-10-04 NOTE — Anesthesia Preprocedure Evaluation (Signed)
Anesthesia Evaluation  Patient identified by MRN, date of birth, ID band Patient awake    Reviewed: Allergy & Precautions, H&P , NPO status , Patient's Chart, lab work & pertinent test results  Airway Mallampati: I TM Distance: >3 FB Neck ROM: full    Dental no notable dental hx.    Pulmonary Current Smoker,    Pulmonary exam normal       Cardiovascular negative cardio ROS      Neuro/Psych negative neurological ROS     GI/Hepatic negative GI ROS, Neg liver ROS,   Endo/Other  negative endocrine ROS  Renal/GU negative Renal ROS     Musculoskeletal   Abdominal Normal abdominal exam  (+)   Peds  Hematology negative hematology ROS (+)   Anesthesia Other Findings   Reproductive/Obstetrics (+) Pregnancy                           Anesthesia Physical Anesthesia Plan  ASA: II  Anesthesia Plan: Epidural   Post-op Pain Management:    Induction:   Airway Management Planned:   Additional Equipment:   Intra-op Plan:   Post-operative Plan:   Informed Consent: I have reviewed the patients History and Physical, chart, labs and discussed the procedure including the risks, benefits and alternatives for the proposed anesthesia with the patient or authorized representative who has indicated his/her understanding and acceptance.     Plan Discussed with:   Anesthesia Plan Comments:         Anesthesia Quick Evaluation

## 2013-10-04 NOTE — H&P (Signed)
28 y.o. 7965w6d  G3P1011 comes in for induction at 39 weeks for hx PE and heterozygous Factor V.  Otherwise has good fetal movement and no bleeding.  Past Medical History  Diagnosis Date  . LGSIL (low grade squamous intraepithelial dysplasia) 12-07 &3-08    C&B 3-08/CONDYLOMA TREATED 06-19-06  . Complete miscarriage 09-12-06  . ADD (attention deficit disorder)   . Factor V deficiency   . Pulmonary embolus     age 28  . Ovarian cyst     Past Surgical History  Procedure Laterality Date  . Incise and drain abcess  11-16-09    LEFT LABIA  . Augmentation mammaplasty  08/2009  . Tonsillectomy    . Wisdom tooth extraction      OB History  Gravida Para Term Preterm AB SAB TAB Ectopic Multiple Living  3 1 1  1 1    1     # Outcome Date GA Lbr Len/2nd Weight Sex Delivery Anes PTL Lv  3 CUR           2 TRM 2009 2634w0d  2.977 kg (6 lb 9 oz) F SVD   Y  1 SAB 2007              History   Social History  . Marital Status: Single    Spouse Name: N/A    Number of Children: N/A  . Years of Education: N/A   Occupational History  . Not on file.   Social History Main Topics  . Smoking status: Current Some Day Smoker  . Smokeless tobacco: Never Used  . Alcohol Use: Yes     Comment: once a week  . Drug Use: No  . Sexual Activity: Yes    Birth Control/ Protection: Implant   Other Topics Concern  . Not on file   Social History Narrative  . No narrative on file   Caffeine and Peanut butter flavor    Prenatal Transfer Tool  Maternal Diabetes: No Genetic Screening: Normal Maternal Ultrasounds/Referrals: Normal Fetal Ultrasounds or other Referrals:  None Maternal Substance Abuse:  No Significant Maternal Medications:  Meds include: Other: lovenox Significant Maternal Lab Results: None  Other PNC: uncomplicated.    Filed Vitals:   10/04/13 0904  BP: 106/70  Pulse: 80  Temp:   Resp: 18     Lungs/Cor:  NAD Abdomen:  soft, gravid Ex:  no cords, erythema SVE:  3-4/80/-2, AROM  clear. FHTs:  120s, good STV, NST R Toco:  q5   A/P   Term, hx of PE with heterozygous Factor V- pt has been on Lovenox.  Last dose of Lovenox was >24 hours ago.  Will do SCDS during labor.  Will restart Lovenox 40 mg BID pp.  Pt desires epidural- labs per anesthesia.    GBS POS- PCN.  HORVATH,MICHELLE A

## 2013-10-04 NOTE — Anesthesia Procedure Notes (Signed)
Epidural Patient location during procedure: OB Start time: 10/04/2013 11:30 AM End time: 10/04/2013 11:34 AM  Staffing Anesthesiologist: Leilani AbleHATCHETT, Devyne Hauger Performed by: anesthesiologist   Preanesthetic Checklist Completed: patient identified, surgical consent, pre-op evaluation, timeout performed, IV checked, risks and benefits discussed and monitors and equipment checked  Epidural Patient position: sitting Prep: site prepped and draped and DuraPrep Patient monitoring: continuous pulse ox and blood pressure Approach: midline Location: L3-L4 Injection technique: LOR air  Needle:  Needle type: Tuohy  Needle gauge: 17 G Needle length: 9 cm and 9 Needle insertion depth: 5 cm cm Catheter type: closed end flexible Catheter size: 19 Gauge Catheter at skin depth: 10 cm Test dose: negative and Other  Assessment Sensory level: T9 Events: blood not aspirated, injection not painful, no injection resistance, negative IV test and no paresthesia  Additional Notes Reason for block:procedure for pain

## 2013-10-05 LAB — CBC
HCT: 30.7 % — ABNORMAL LOW (ref 36.0–46.0)
HEMOGLOBIN: 9.8 g/dL — AB (ref 12.0–15.0)
MCH: 27.6 pg (ref 26.0–34.0)
MCHC: 31.9 g/dL (ref 30.0–36.0)
MCV: 86.5 fL (ref 78.0–100.0)
PLATELETS: 219 10*3/uL (ref 150–400)
RBC: 3.55 MIL/uL — AB (ref 3.87–5.11)
RDW: 13.4 % (ref 11.5–15.5)
WBC: 9.6 10*3/uL (ref 4.0–10.5)

## 2013-10-05 MED ORDER — PNEUMOCOCCAL VAC POLYVALENT 25 MCG/0.5ML IJ INJ
0.5000 mL | INJECTION | INTRAMUSCULAR | Status: AC
  Administered 2013-10-06: 0.5 mL via INTRAMUSCULAR
  Filled 2013-10-05: qty 0.5

## 2013-10-05 NOTE — Progress Notes (Addendum)
Post Partum Day 1 Subjective: no complaints, up ad lib, voiding and tolerating PO  Objective: Blood pressure 106/71, pulse 74, temperature 97.5 F (36.4 C), temperature source Oral, resp. rate 18, height 5\' 9"  (1.753 m), weight 79.379 kg (175 lb), last menstrual period 12/29/2012, SpO2 99.00%, unknown if currently breastfeeding.  Physical Exam:  General: alert, cooperative and appears stated age Lochia: appropriate Uterine Fundus: firm   Recent Labs  10/04/13 1815 10/05/13 0545  HGB 9.9* 9.8*  HCT 30.0* 30.7*    Assessment/Plan: Plan for discharge tomorrow and Breastfeeding Continue lovenox 40mg  [place] Qday for 6 weeks PP   LOS: 1 day   ROSS,KENDRA H. 10/05/2013, 9:35 AM

## 2013-10-05 NOTE — Progress Notes (Signed)
UR chart review completed.  

## 2013-10-05 NOTE — Anesthesia Postprocedure Evaluation (Signed)
  Anesthesia Post-op Note  Patient: Peggy [name]  Procedure(s) Performed: * No procedures listed *  Patient Location: Mother/Baby  Anesthesia Type:Epidural  Level of Consciousness: awake, alert , oriented and patient cooperative  Airway and Oxygen Therapy: Patient Spontanous Breathing  Post-op Pain: mild  Post-op Assessment: Post-op Vital signs reviewed, Patient's Cardiovascular Status Stable, Respiratory Function Stable, Patent Airway, No signs of Nausea or vomiting, Adequate PO intake, Pain level controlled and No headache  Post-op Vital Signs: Reviewed and stable  Last Vitals:  Filed Vitals:   10/05/13 0600  BP: 106/71  Pulse: 74  Temp: 36.4 C  Resp: 18    Complications: No apparent anesthesia complications

## 2013-10-06 MED ORDER — ENOXAPARIN SODIUM 40 MG/0.4ML ~~LOC~~ SOLN: 40.0000 mg | SUBCUTANEOUS | Status: DC

## 2013-10-06 NOTE — Discharge Summary (Signed)
Obstetric Discharge Summary Reason for Admission: induction of labor Prenatal Procedures: ultrasound Intrapartum Procedures: spontaneous vaginal delivery and episiotomy 2nd degree Postpartum Procedures: none Complications-Operative and Postpartum: none Hemoglobin  Date Value Ref Range Status  10/05/2013 9.8* 12.0 - 15.0 g/dL Final     HCT  Date Value Ref Range Status  10/05/2013 30.7* 36.0 - 46.0 % Final    Physical Exam:  General: alert and cooperative Lochia: appropriate Uterine Fundus: firm DVT Evaluation: No evidence of DVT seen on physical exam.  Discharge Diagnoses: Term Pregnancy-delivered  Discharge Information: Date: 10/06/2013 Activity: pelvic rest Diet: routine Medications: PNV, Ibuprofen and Lovenox Condition: stable Instructions: refer to practice specific booklet Discharge to: home Follow-up Information   Follow up with HORVATH,MICHELLE A, MD In 4 weeks.   Specialty:  Obstetrics and Gynecology   Contact information:   [address] [place] [address] [phone]       Newborn Data: Live born female  Birth Weight: 7 lb 7.4 oz (3385 g) APGAR: 8, 9  Home with mother.  Philip AspenCALLAHAN, SIDNEY 10/06/2013, 11:15 AM

## 2014-02-08 ENCOUNTER — Encounter (HOSPITAL_COMMUNITY): Payer: Self-pay

## 2014-09-14 ENCOUNTER — Encounter (HOSPITAL_COMMUNITY): Payer: Self-pay | Admitting: Emergency Medicine

## 2014-09-14 ENCOUNTER — Emergency Department (HOSPITAL_COMMUNITY): Payer: Self-pay

## 2014-09-14 DIAGNOSIS — Z331 Pregnant state, incidental: Secondary | ICD-10-CM | POA: Insufficient documentation

## 2014-09-14 DIAGNOSIS — Z86711 Personal history of pulmonary embolism: Secondary | ICD-10-CM | POA: Insufficient documentation

## 2014-09-14 DIAGNOSIS — Z8659 Personal history of other mental and behavioral disorders: Secondary | ICD-10-CM | POA: Insufficient documentation

## 2014-09-14 DIAGNOSIS — Z720 Tobacco use: Secondary | ICD-10-CM | POA: Insufficient documentation

## 2014-09-14 DIAGNOSIS — Z8742 Personal history of other diseases of the female genital tract: Secondary | ICD-10-CM | POA: Insufficient documentation

## 2014-09-14 DIAGNOSIS — R079 Chest pain, unspecified: Secondary | ICD-10-CM | POA: Insufficient documentation

## 2014-09-14 DIAGNOSIS — R0602 Shortness of breath: Secondary | ICD-10-CM | POA: Insufficient documentation

## 2014-09-14 LAB — BASIC METABOLIC PANEL
ANION GAP: 8 (ref 5–15)
BUN: 11 mg/dL (ref 6–20)
CO2: 26 mmol/L (ref 22–32)
Calcium: 9.9 mg/dL (ref 8.9–10.3)
Chloride: 101 mmol/L (ref 101–111)
Creatinine, Ser: 0.77 mg/dL (ref 0.44–1.00)
Glucose, Bld: 91 mg/dL (ref 65–99)
Potassium: 3.9 mmol/L (ref 3.5–5.1)
Sodium: 135 mmol/L (ref 135–145)

## 2014-09-14 LAB — URINALYSIS, ROUTINE W REFLEX MICROSCOPIC
Glucose, UA: NEGATIVE mg/dL
Hgb urine dipstick: NEGATIVE
Ketones, ur: 15 mg/dL — AB
NITRITE: NEGATIVE
PROTEIN: NEGATIVE mg/dL
Specific Gravity, Urine: 1.041 — ABNORMAL HIGH (ref 1.005–1.030)
UROBILINOGEN UA: 1 mg/dL (ref 0.0–1.0)
pH: 6 (ref 5.0–8.0)

## 2014-09-14 LAB — URINE MICROSCOPIC-ADD ON

## 2014-09-14 LAB — CBC
HEMATOCRIT: 38 % (ref 36.0–46.0)
HEMOGLOBIN: 12.8 g/dL (ref 12.0–15.0)
MCH: 29.4 pg (ref 26.0–34.0)
MCHC: 33.7 g/dL (ref 30.0–36.0)
MCV: 87.4 fL (ref 78.0–100.0)
Platelets: 311 10*3/uL (ref 150–400)
RBC: 4.35 MIL/uL (ref 3.87–5.11)
RDW: 14.6 % (ref 11.5–15.5)
WBC: 8.2 10*3/uL (ref 4.0–10.5)

## 2014-09-14 LAB — BRAIN NATRIURETIC PEPTIDE: B NATRIURETIC PEPTIDE 5: 28.9 pg/mL (ref 0.0–100.0)

## 2014-09-14 LAB — I-STAT TROPONIN, ED: Troponin i, poc: 0 ng/mL (ref 0.00–0.08)

## 2014-09-14 LAB — POC URINE PREG, ED: Preg Test, Ur: POSITIVE — AB

## 2014-09-14 NOTE — ED Notes (Signed)
Pt. reports central chest pain " tightness" onset 2 am this morning with SOB , denies emesis or diaphoresis . No cough or congestion .

## 2014-09-15 ENCOUNTER — Emergency Department (HOSPITAL_COMMUNITY)
Admission: EM | Admit: 2014-09-15 | Discharge: 2014-09-15 | Disposition: A | Discharge status: Home / Self Care | Payer: Self-pay | Attending: Emergency Medicine | Admitting: Emergency Medicine

## 2014-09-15 ENCOUNTER — Emergency Department (HOSPITAL_COMMUNITY): Payer: Medicaid Other

## 2014-09-15 ENCOUNTER — Encounter (HOSPITAL_COMMUNITY): Payer: Self-pay

## 2014-09-15 DIAGNOSIS — R079 Chest pain, unspecified: Secondary | ICD-10-CM

## 2014-09-15 DIAGNOSIS — Z3490 Encounter for supervision of normal pregnancy, unspecified, unspecified trimester: Secondary | ICD-10-CM

## 2014-09-15 LAB — I-STAT BETA HCG BLOOD, ED (MC, WL, AP ONLY)

## 2014-09-15 MED ORDER — OXYCODONE-ACETAMINOPHEN 5-325 MG PO TABS
ORAL_TABLET | ORAL | Status: AC
  Filled 2014-09-15: qty 1

## 2014-09-15 MED ORDER — IOHEXOL 350 MG/ML SOLN
80.0000 mL | Freq: Once | INTRAVENOUS | Status: AC | PRN
  Administered 2014-09-15: 80 mL via INTRAVENOUS

## 2014-09-15 MED ORDER — OXYCODONE-ACETAMINOPHEN 5-325 MG PO TABS: 1.0000 | ORAL_TABLET | Freq: Four times a day (QID) | ORAL | Status: DC | PRN

## 2014-09-15 NOTE — ED Provider Notes (Signed)
CSN: [id number]     Arrival date & time 09/14/14  2240 History  This chart was scribed for Shon Baton, MD by Annye Asa, ED Scribe. This patient was seen in room B18C/B18C and the patient's care was started at 12:53 AM.    Chief Complaint  Patient presents with  . Chest Pain   The history is provided by the patient. No language interpreter was used.    HPI Comments: Peggy [name] is a 29 y.o. female with past medical history of factor V deficiency, PE (age 50) who presents to the Emergency Department complaining of 24 hours of intermittent central chest pain, described as a "bruising" pain, rated 9/10 and exacerbated with movement, exertion, and applied pressure. She notes associated SOB. She states she has taken TUMS without relief; she avoids her anti-acid medications due to unspecified side effects. Patient explains she was diagnosed with a PE  when she was 16 and has been on and off anticoagulants since that time; she is not on anticoagulants at present (taken off Lovenox two months PTA). She states her current symptoms feel similar to prior experiences with blood clots. She denies abdominal pain, nausea, vomiting.  LNMP 08/17/14. She is not breastfeeding at this time. G 4 P 2 A 1 GYN Dr. Audie Box at [facility]  Past Medical History  Diagnosis Date  . LGSIL (low grade squamous intraepithelial dysplasia) 12-07 &3-08    C&B 3-08/CONDYLOMA TREATED 06-19-06  . Complete miscarriage 09-12-06  . ADD (attention deficit disorder)   . Factor V deficiency   . Pulmonary embolus     age 85  . Ovarian cyst    Past Surgical History  Procedure Laterality Date  . Incise and drain abcess  11-16-09    LEFT LABIA  . Augmentation mammaplasty  08/2009  . Tonsillectomy    . Wisdom tooth extraction     Family History  Problem Relation Age of Onset  . Cancer Maternal Grandmother     skin  . Diabetes Maternal Grandmother   . Hypertension Maternal Grandfather   . Breast cancer Paternal  Grandmother   . Cancer Paternal Grandfather     ? colon   History  Substance Use Topics  . Smoking status: Current Some Day Smoker  . Smokeless tobacco: Never Used  . Alcohol Use: Yes     Comment: once a week   OB History    Gravida Para Term Preterm AB TAB SAB Ectopic Multiple Living   Review of Systems  Constitutional: Negative for fever.  Respiratory: Positive for chest tightness and shortness of breath. Negative for cough.   Cardiovascular: Negative for chest pain and leg swelling.  Gastrointestinal: Negative for nausea, vomiting and abdominal pain.  Genitourinary: Negative for dysuria, vaginal bleeding and vaginal discharge.  Musculoskeletal: Negative for back pain.  Neurological: Negative for headaches.  All other systems reviewed and are negative.     Allergies  Caffeine and Peanut butter flavor  Home Medications   Prior to Admission medications   Medication Sig Start Date End Date Taking? Authorizing Provider  oxyCODONE-acetaminophen (PERCOCET/ROXICET) 5-325 MG per tablet Take 1 tablet by mouth every 6 (six) hours as needed for severe pain. 09/15/14   Shon Baton, MD   BP 103/71 mmHg  Pulse 54  Temp(Src) 98.1 F (36.7 C) (Oral)  Resp 17  Ht  (1.778 m)  Wt 142 lb (64.411 kg)  BMI  20.37 kg/m2  SpO2 97%  LMP 08/15/2014 Physical Exam  Constitutional: She is oriented to person, place, and time. She appears well-developed and well-nourished.  HENT:  Head: Normocephalic and atraumatic.  Eyes: Pupils are equal, round, and reactive to light.  Cardiovascular: Normal rate, regular rhythm and normal heart sounds.   No murmur heard. Pulmonary/Chest: Effort normal. No respiratory distress. She has no wheezes. She exhibits tenderness.  Abdominal: Soft. Bowel sounds are normal. There is no tenderness. There is no rebound.  Musculoskeletal: She exhibits no edema.  Neurological: She is alert and oriented to person, place, and time.  Skin:  Skin is warm and dry.  Psychiatric: She has a normal mood and affect.  Nursing note and vitals reviewed.   ED Course  Procedures   DIAGNOSTIC STUDIES: Oxygen Saturation is 100% on RA, normal by my interpretation.    COORDINATION OF CARE: 1:04 AM Discussed costs and benefits of further testing options during pregnancy; patient verbally expressed understanding and requested imaging at this time.   Labs Review Labs Reviewed  URINALYSIS, ROUTINE W REFLEX MICROSCOPIC (NOT AT [facility]) - Abnormal; Notable for the following:    Color, Urine AMBER (*)    APPearance CLOUDY (*)    Specific Gravity, Urine 1.041 (*)    Bilirubin Urine SMALL (*)    Ketones, ur 15 (*)    Leukocytes, UA SMALL (*)    All other components within normal limits  URINE MICROSCOPIC-ADD ON - Abnormal; Notable for the following:    Bacteria, UA FEW (*)    Crystals CA OXALATE CRYSTALS (*)    All other components within normal limits  POC URINE PREG, ED - Abnormal; Notable for the following:    Preg Test, Ur POSITIVE (*)    All other components within normal limits  I-STAT BETA HCG BLOOD, ED ([facility], [facility], [facility] ONLY) - Abnormal; Notable for the following:    I-stat hCG, quantitative >2000.0 (*)    All other components within normal limits  CBC  BASIC METABOLIC PANEL  BRAIN NATRIURETIC PEPTIDE  I-STAT TROPOININ, ED    Imaging Review Dg Chest 2 View  09/14/2014   CLINICAL DATA:  Midchest pain, onset early this morning.  EXAM: CHEST  2 VIEW  COMPARISON:  CT 08/13/2013  FINDINGS: The heart size and mediastinal contours are within normal limits. Both lungs are clear. The visualized skeletal structures are unremarkable.  IMPRESSION: No active cardiopulmonary disease.   Electronically Signed   By: Ellery Plunk M.D.   On: 09/14/2014 23:38   Ct Angio Chest Pe W/cm &/or Wo Cm  09/15/2014   CLINICAL DATA:  Initial valuation for acute mid chest pain and shortness of breath. Remote history of pulmonary embolism.  EXAM: CT ANGIOGRAPHY  CHEST WITH CONTRAST  TECHNIQUE: Multidetector CT imaging of the chest was performed using the standard protocol during bolus administration of intravenous contrast. Multiplanar CT image reconstructions and MIPs were obtained to evaluate the vascular anatomy.  CONTRAST:  80mL OMNIPAQUE IOHEXOL 350 MG/ML SOLN  COMPARISON:  Prior radiograph from 09/14/2014  FINDINGS: Partially visualized thyroid is normal. No pathologically enlarged mediastinal, hilar, or axillary lymph nodes identified.  Intrathoracic aorta is of normal caliber and appearance. Great vessels within normal limits.  Heart size normal.  No pericardial effusion.  Pulmonary arterial tree well opacified. No filling defect to suggest acute pulmonary embolism. Re-formatted imaging confirms these findings. Main pulmonary artery normal in caliber measuring 2.7 cm in diameter.  Lungs are clear without focal infiltrate or pulmonary  edema. No pleural effusion. Mild subsegmental atelectasis seen dependently within the lower lobes. No pneumothorax. There is a single 4 mm right upper lobe nodule (series 6, image 25), indeterminate. This is of doubtful clinical significance. Please note that Fleischner criteria do not apply in patients of this age. No other pulmonary nodule or mass.  Partially visualized upper abdomen unremarkable.  No acute osseus abnormality. No worrisome lytic or blastic osseous lesion.  Bilateral breast implants noted.  IMPRESSION: No CT evidence for acute pulmonary embolism or other acute cardiopulmonary abnormality.   Electronically Signed   By: Rise MuBenjamin  McClintock M.D.   On: 09/15/2014 04:16     EKG Interpretation None      MDM   Final diagnoses:  Chest pain, unspecified chest pain type  Pregnancy   Patient presents with chest pain. Incidentally found to be pregnant as well. Last Cl[place] Riley[place]chell. Approximately one month ago. Vital signs are reassuring. History of blood clots and factor V Leiden. Not currently on anticoagulation and  patient feels her chest pain is consistent with prior PEs. However she does have a reproducible component to the pain.  Basic labwork, chest x-ray, and EKG are all reassuring. Discussed at length with the patient the risks and benefits of CTA to rule out PE. She is too high risk to have a screening d-dimer. Patient is willing to have CTA with a shielded abdomen and understands the risks to her early pregnancy. Discussed this with radiology as well. Patient was given hydrocodone for her pain. CTA is negative for PE.Given reproducibility of pain and otherwise normal workup, suspect chest wall pain. Patient is not having any abdominal pain, vaginal bleeding, vaginal discharge. At this time I do not feel she needs ultrasound testing. She will need to follow-up closely with her OB/GYN to restart anticoagulation and have a first trimester ultrasound.  After history, exam, and medical workup I feel the patient has been appropriately medically screened and is safe for discharge home. Pertinent diagnoses were discussed with the patient. Patient was given return precautions.  I personally performed the services described in this documentation, which was scribed in my presence. The recorded information has been reviewed and is accurate.      Shon Batonourtney F Vita Currin, MD 09/15/14 [phone]

## 2014-09-15 NOTE — ED Notes (Signed)
Patient transported to CT 

## 2014-09-15 NOTE — Discharge Instructions (Signed)
You were seen today for chest pain. For chest pain is likely musculoskeletal in nature. You were also incidentally found to be pregnant. You need to follow-up with your OB/GYN to restart blood thinners.  First Trimester of Pregnancy The first trimester of pregnancy is from week 1 until the end of week 12 (months 1 through 3). A week after a sperm fertilizes an egg, the egg will implant on the wall of the uterus. This embryo will begin to develop into a baby. Genes from you and your partner are forming the baby. The female genes determine whether the baby is a boy or a girl. At 6-8 weeks, the eyes and face are formed, and the heartbeat can be seen on ultrasound. At the end of 12 weeks, all the baby's organs are formed.  Now that you are pregnant, you will want to do everything you can to have a healthy baby. Two of the most important things are to get good prenatal care and to follow your health care provider's instructions. Prenatal care is all the medical care you receive before the baby's birth. This care will help prevent, find, and treat any problems during the pregnancy and childbirth. BODY CHANGES Your body goes through many changes during pregnancy. The changes vary from woman to woman.   You may gain or lose a couple of pounds at first.  You may feel sick to your stomach (nauseous) and throw up (vomit). If the vomiting is uncontrollable, call your health care provider.  You may tire easily.  You may develop headaches that can be relieved by medicines approved by your health care provider.  You may urinate more often. Painful urination may mean you have a bladder infection.  You may develop heartburn as a result of your pregnancy.  You may develop constipation because certain hormones are causing the muscles that push waste through your intestines to slow down.  You may develop hemorrhoids or swollen, bulging veins (varicose veins).  Your breasts may begin to grow larger and become  tender. Your nipples may stick out more, and the tissue that surrounds them (areola) may become darker.  Your gums may bleed and may be sensitive to brushing and flossing.  Dark spots or blotches (chloasma, mask of pregnancy) may develop on your face. This will likely fade after the baby is born.  Your menstrual periods will stop.  You may have a loss of appetite.  You may develop cravings for certain kinds of food.  You may have changes in your emotions from day to day, such as being excited to be pregnant or being concerned that something may go wrong with the pregnancy and baby.  You may have more vivid and strange dreams.  You may have changes in your hair. These can include thickening of your hair, rapid growth, and changes in texture. Some women also have hair loss during or after pregnancy, or hair that feels dry or thin. Your hair will most likely return to normal after your baby is born. WHAT TO EXPECT AT YOUR PRENATAL VISITS During a routine prenatal visit:  You will be weighed to make sure you and the baby are growing normally.  Your blood pressure will be taken.  Your abdomen will be measured to track your baby's growth.  The fetal heartbeat will be listened to starting around week 10 or 12 of your pregnancy.  Test results from any previous visits will be discussed. Your health care provider may ask you:  How you are  feeling.  If you are feeling the baby move.  If you have had any abnormal symptoms, such as leaking fluid, bleeding, severe headaches, or abdominal cramping.  If you have any questions. Other tests that may be performed during your first trimester include:  Blood tests to find your blood type and to check for the presence of any previous infections. They will also be used to check for low iron levels (anemia) and Rh antibodies. Later in the pregnancy, blood tests for diabetes will be done along with other tests if problems develop.  Urine tests to  check for infections, diabetes, or protein in the urine.  An ultrasound to confirm the proper growth and development of the baby.  An amniocentesis to check for possible genetic problems.  Fetal screens for spina bifida and Down syndrome.  You may need other tests to make sure you and the baby are doing well. HOME CARE INSTRUCTIONS  Medicines  Follow your health care provider's instructions regarding medicine use. Specific medicines may be either safe or unsafe to take during pregnancy.  Take your prenatal vitamins as directed.  If you develop constipation, try taking a stool softener if your health care provider approves. Diet  Eat regular, well-balanced meals. Choose a variety of foods, such as meat or vegetable-based protein, fish, milk and low-fat dairy products, vegetables, fruits, and whole grain breads and cereals. Your health care provider will help you determine the amount of weight gain that is right for you.  Avoid raw meat and uncooked cheese. These carry germs that can cause birth defects in the baby.  Eating four or five small meals rather than three large meals a day may help relieve nausea and vomiting. If you start to feel nauseous, eating a few soda crackers can be helpful. Drinking liquids between meals instead of during meals also seems to help nausea and vomiting.  If you develop constipation, eat more high-fiber foods, such as fresh vegetables or fruit and whole grains. Drink enough fluids to keep your urine clear or pale yellow. Activity and Exercise  Exercise only as directed by your health care provider. Exercising will help you:  Control your weight.  Stay in shape.  Be prepared for labor and delivery.  Experiencing pain or cramping in the lower abdomen or low back is a good sign that you should stop exercising. Check with your health care provider before continuing normal exercises.  Try to avoid standing for long periods of time. Move your legs often  if you must stand in one place for a long time.  Avoid heavy lifting.  Wear low-heeled shoes, and practice good posture.  You may continue to have sex unless your health care provider directs you otherwise. Relief of Pain or Discomfort  Wear a good support bra for breast tenderness.   Take warm sitz baths to soothe any pain or discomfort caused by hemorrhoids. Use hemorrhoid cream if your health care provider approves.   Rest with your legs elevated if you have leg cramps or low back pain.  If you develop varicose veins in your legs, wear support hose. Elevate your feet for 15 minutes, 3-4 times a day. Limit salt in your diet. Prenatal Care  Schedule your prenatal visits by the twelfth week of pregnancy. They are usually scheduled monthly at first, then more often in the last 2 months before delivery.  Write down your questions. Take them to your prenatal visits.  Keep all your prenatal visits as directed by your health  care provider. Safety  Wear your seat belt at all times when driving.  Make a list of emergency phone numbers, including numbers for family, friends, the hospital, and police and fire departments. General Tips  Ask your health care provider for a referral to a local prenatal education class. Begin classes no later than at the beginning of month 6 of your pregnancy.  Ask for help if you have counseling or nutritional needs during pregnancy. Your health care provider can offer advice or refer you to specialists for help with various needs.  Do not use hot tubs, steam rooms, or saunas.  Do not douche or use tampons or scented sanitary pads.  Do not cross your legs for long periods of time.  Avoid cat litter boxes and soil used by cats. These carry germs that can cause birth defects in the baby and possibly loss of the fetus by miscarriage or stillbirth.  Avoid all smoking, herbs, alcohol, and medicines not prescribed by your health care provider. Chemicals in  these affect the formation and growth of the baby.  Schedule a dentist appointment. At home, brush your teeth with a soft toothbrush and be gentle when you floss. SEEK MEDICAL CARE IF:   You have dizziness.  You have mild pelvic cramps, pelvic pressure, or nagging pain in the abdominal area.  You have persistent nausea, vomiting, or diarrhea.  You have a bad smelling vaginal discharge.  You have pain with urination.  You notice increased swelling in your face, hands, legs, or ankles. SEEK IMMEDIATE MEDICAL CARE IF:   You have a fever.  You are leaking fluid from your vagina.  You have spotting or bleeding from your vagina.  You have severe abdominal cramping or pain.  You have rapid weight gain or loss.  You vomit blood or material that looks like coffee grounds.  You are exposed to [place] measles and have never had them.  You are exposed to fifth disease or chickenpox.  You develop a severe headache.  You have shortness of breath.  You have any kind of trauma, such as from a fall or a car accident. Document Released: 03/20/2001 Document Revised: 08/10/2013 Document Reviewed: 02/03/2013 [facility] Patient Information 2015 [place], [place]. This information is not intended to replace advice given to you by your health care provider. Make sure you discuss any questions you have with your health care provider. Chest Wall Pain Chest wall pain is pain in or around the bones and muscles of your chest. It may take up to 6 weeks to get better. It may take longer if you must stay physically active in your work and activities.  CAUSES  Chest wall pain may happen on its own. However, it may be caused by:  A viral illness like the flu.  Injury.  Coughing.  Exercise.  Arthritis.  Fibromyalgia.  Shingles. HOME CARE INSTRUCTIONS   Avoid overtiring physical activity. Try not to strain or perform activities that cause pain. This includes any activities using your chest or  your abdominal and side muscles, especially if heavy weights are used.  Put ice on the sore area.  Put ice in a plastic bag.  Place a towel between your skin and the bag.  Leave the ice on for 15-20 minutes per hour while awake for the first 2 days.  Only take over-the-counter or prescription medicines for pain, discomfort, or fever as directed by your caregiver. SEEK IMMEDIATE MEDICAL CARE IF:   Your pain increases, or you are very  uncomfortable.  You have a fever.  Your chest pain becomes worse.  You have new, unexplained symptoms.  You have nausea or vomiting.  You feel sweaty or lightheaded.  You have a cough with phlegm (sputum), or you cough up blood. MAKE SURE YOU:   Understand these instructions.  Will watch your condition.  Will get help right away if you are not doing well or get worse. Document Released: 03/26/2005 Document Revised: 06/18/2011 Document Reviewed: 11/20/2010 [facility] Patient Information 2015 [place], [place]. This information is not intended to replace advice given to you by your health care provider. Make sure you discuss any questions you have with your health care provider.

## 2014-11-04 ENCOUNTER — Emergency Department (HOSPITAL_COMMUNITY)
Admission: EM | Admit: 2014-11-04 | Discharge: 2014-11-05 | Disposition: A | Discharge status: Home / Self Care | Payer: Medicaid Other | Attending: Emergency Medicine | Admitting: Emergency Medicine

## 2014-11-04 DIAGNOSIS — R200 Anesthesia of skin: Secondary | ICD-10-CM | POA: Insufficient documentation

## 2014-11-04 DIAGNOSIS — Z7901 Long term (current) use of anticoagulants: Secondary | ICD-10-CM | POA: Insufficient documentation

## 2014-11-04 DIAGNOSIS — R079 Chest pain, unspecified: Secondary | ICD-10-CM | POA: Insufficient documentation

## 2014-11-04 DIAGNOSIS — Z720 Tobacco use: Secondary | ICD-10-CM | POA: Insufficient documentation

## 2014-11-04 DIAGNOSIS — R55 Syncope and collapse: Secondary | ICD-10-CM

## 2014-11-04 DIAGNOSIS — Z8742 Personal history of other diseases of the female genital tract: Secondary | ICD-10-CM | POA: Insufficient documentation

## 2014-11-04 DIAGNOSIS — Z862 Personal history of diseases of the blood and blood-forming organs and certain disorders involving the immune mechanism: Secondary | ICD-10-CM

## 2014-11-04 DIAGNOSIS — Z86711 Personal history of pulmonary embolism: Secondary | ICD-10-CM | POA: Insufficient documentation

## 2014-11-04 DIAGNOSIS — Z8659 Personal history of other mental and behavioral disorders: Secondary | ICD-10-CM | POA: Insufficient documentation

## 2014-11-04 LAB — BASIC METABOLIC PANEL
Anion gap: 7 (ref 5–15)
BUN: 10 mg/dL (ref 6–20)
CALCIUM: 8.7 mg/dL — AB (ref 8.9–10.3)
CHLORIDE: 105 mmol/L (ref 101–111)
CO2: 26 mmol/L (ref 22–32)
Creatinine, Ser: 0.66 mg/dL (ref 0.44–1.00)
GFR calc non Af Amer: >60 mL/min (ref >60)
GLUCOSE: 108 mg/dL — AB (ref 65–99)
Potassium: 4.1 mmol/L (ref 3.5–5.1)
Sodium: 138 mmol/L (ref 135–145)

## 2014-11-04 LAB — CBC
HCT: 39.1 % (ref 36.0–46.0)
Hemoglobin: 13.1 g/dL (ref 12.0–15.0)
MCH: 30.7 pg (ref 26.0–34.0)
MCHC: 33.5 g/dL (ref 30.0–36.0)
MCV: 91.6 fL (ref 78.0–100.0)
Platelets: 266 10*3/uL (ref 150–400)
RBC: 4.27 MIL/uL (ref 3.87–5.11)
RDW: 16.7 % — ABNORMAL HIGH (ref 11.5–15.5)
WBC: 8.5 10*3/uL (ref 4.0–10.5)

## 2014-11-04 NOTE — ED Notes (Signed)
cbg of 100. 

## 2014-11-04 NOTE — ED Notes (Signed)
Pt had a syncopal episode tonight and now with chest pain and sob, states she has had a clot in her chest since the age of 46, currently on Lovenox daily

## 2014-11-04 NOTE — ED Provider Notes (Signed)
CSN: [id number]   Arrival date & time 11/04/14 2225  History  This chart was scribed for  Geoffery Lyons, MD by Bethel Born, ED Scribe. This patient was seen in room APA06/APA06 and the patient's care was started at 11:28 PM.  Chief Complaint  Patient presents with  . Loss of Consciousness    HPI The history is provided by the patient and a relative. No language interpreter was used.   Peggy [name] is a 29 y.o. female with PMHx of PE on Lovenox who presents to the Emergency Department complaining of syncope tonight just PTA. The pt passed out in the bathroom while running bath water for her children. The episode was witnessed by her 28 year old who called an aunt.  Associated symptoms include intermittent chest pain that suddenly started just before the episode and numbness in the left arm. She has had the pain intermittently for years but the syncopal episode is new. She took 2 doses of Lovenox today. Pt states that she has been drinking plenty of fluids. Cardiology: [facility]  Past Medical History  Diagnosis Date  . LGSIL (low grade squamous intraepithelial dysplasia) 12-07 &3-08    C&B 3-08/CONDYLOMA TREATED 06-19-06  . Complete miscarriage 09-12-06  . ADD (attention deficit disorder)   . Factor V deficiency   . Pulmonary embolus     age 17  . Ovarian cyst     Past Surgical History  Procedure Laterality Date  . Incise and drain abcess  11-16-09    LEFT LABIA  . Augmentation mammaplasty  08/2009  . Tonsillectomy    . Wisdom tooth extraction      Family History  Problem Relation Age of Onset  . Cancer Maternal Grandmother     skin  . Diabetes Maternal Grandmother   . Hypertension Maternal Grandfather   . Breast cancer Paternal Grandmother   . Cancer Paternal Grandfather     ? colon    History  Substance Use Topics  . Smoking status: Current Some Day Smoker  . Smokeless tobacco: Never Used  . Alcohol Use: Yes     Comment: once a week     Review of  Systems 10 Systems reviewed and all are negative for acute change except as noted in the HPI.   Home Medications   Prior to Admission medications   Medication Sig Start Date End Date Taking? Authorizing Provider  enoxaparin (LOVENOX) 40 MG/0.4ML injection Inject 40 mg into the skin daily.   Yes Historical Provider, MD  oxyCODONE-acetaminophen (PERCOCET/ROXICET) 5-325 MG per tablet Take 1 tablet by mouth every 6 (six) hours as needed for severe pain. Patient taking differently: Take 1 tablet by mouth every 6 (six) hours as needed for moderate pain or severe pain.  09/15/14  Yes Shon Baton, MD    Allergies  Caffeine and Peanut butter flavor  Triage Vitals: BP 91/61 mmHg  Pulse 72  Resp 15  Ht  (1.753 m)  Wt 142 lb (64.411 kg)  BMI 20.96 kg/m2  SpO2 100%  LMP 08/05/2014  Physical Exam  Constitutional: She is oriented to person, place, and time. She appears well-developed and well-nourished. No distress.  HENT:  Head: Normocephalic and atraumatic.  Eyes: EOM are normal. Pupils are equal, round, and reactive to light.  Neck: Neck supple.  Cardiovascular: Normal rate, regular rhythm and normal heart sounds.   No murmur heard. Pulmonary/Chest: Effort normal and breath sounds normal. No respiratory distress. She has no wheezes. She has  no rales.  Abdominal: Soft. Bowel sounds are normal. She exhibits no distension. There is no tenderness.  Musculoskeletal: Normal range of motion. She exhibits no edema.  There is no edema. No calf tenderness or swelling. Homan's sign absent bilaterally.   Neurological: She is alert and oriented to person, place, and time. No cranial nerve deficit.  Skin: Skin is warm and dry. No rash noted. She is not diaphoretic.  Psychiatric: She has a normal mood and affect. Her behavior is normal.  Nursing note and vitals reviewed.   ED Course  Procedures   DIAGNOSTIC STUDIES: Oxygen Saturation is 100% on RA, normal by my interpretation.     COORDINATION OF CARE: 11:31 PM Discussed treatment plan which includes lab work and EKG with pt at bedside and pt agreed to plan.  Labs Review-  Labs Reviewed  BASIC METABOLIC PANEL - Abnormal; Notable for the following:    Glucose, Bld 108 (*)    Calcium 8.7 (*)    All other components within normal limits  CBC - Abnormal; Notable for the following:    RDW 16.7 (*)    All other components within normal limits  URINALYSIS, ROUTINE W REFLEX MICROSCOPIC (NOT AT [facility]) - Abnormal; Notable for the following:    Hgb urine dipstick MODERATE (*)    Leukocytes, UA SMALL (*)    All other components within normal limits  URINE MICROSCOPIC-ADD ON - Abnormal; Notable for the following:    Squamous Epithelial / LPF MANY (*)    Bacteria, UA FEW (*)    All other components within normal limits  CBG MONITORING, ED    Imaging Review No results found.  EKG Interpretation  Date/Time:  Thursday November 04 2014 22:32:24 EDT Ventricular Rate:  75 PR Interval:  172 QRS Duration: 96 QT Interval:  368 QTC Calculation: 411 R Axis:   74 Text Interpretation:  Sinus rhythm No change from prior ecg Confirmed by Garry Bochicchio  MD, Ashlyne Olenick ([id number]) on 11/04/2014 11:33:15 PM       MDM   Final diagnoses:  None     Patient is a 29 year old female who presents after a syncopal episode. She has a history of factor V deficiency and is receiving Lovenox injections daily. Her EKG is normal and laboratory studies are unremarkable. Her oxygen saturations are 100%, her heart rate is in the 60s, and vital signs are stable. I highly doubt pulmonary embolism and do not feel as though a CT scan is indicated. She had a CT scan just over a month ago for similar symptoms which was negative. At this point she will be discharged with instructions to follow-up with her primary Dr. and return if her symptoms recur.   I personally performed the services described in this documentation, which was scribed in my presence. The recorded  information has been reviewed and is accurate.     Geoffery Lyons, MD 11/05/14 [id number]

## 2014-11-05 LAB — URINE MICROSCOPIC-ADD ON

## 2014-11-05 LAB — URINALYSIS, ROUTINE W REFLEX MICROSCOPIC
Bilirubin Urine: NEGATIVE
Glucose, UA: NEGATIVE mg/dL
KETONES UR: NEGATIVE mg/dL
NITRITE: NEGATIVE
Protein, ur: NEGATIVE mg/dL
SPECIFIC GRAVITY, URINE: 1.01 (ref 1.005–1.030)
Urobilinogen, UA: 0.2 mg/dL (ref 0.0–1.0)
pH: 6.5 (ref 5.0–8.0)

## 2014-11-05 LAB — PREGNANCY, URINE: PREG TEST UR: NEGATIVE

## 2014-11-05 LAB — CBG MONITORING, ED: GLUCOSE-CAPILLARY: 100 mg/dL — AB (ref 65–99)

## 2014-11-05 NOTE — Discharge Instructions (Signed)
Syncope °Syncope is a medical term for fainting or passing out. This means you lose consciousness and drop to the ground. People are generally unconscious for less than 5 minutes. You may have some muscle twitches for up to 15 seconds before waking up and returning to normal. Syncope occurs more often in older adults, but it can happen to anyone. While most causes of syncope are not dangerous, syncope can be a sign of a serious medical problem. It is important to seek medical care.  °CAUSES  °Syncope is caused by a sudden drop in blood flow to the brain. The specific cause is often not determined. Factors that can bring on syncope include: °· Taking medicines that lower blood pressure. °· Sudden changes in posture, such as standing up quickly. °· Taking more medicine than prescribed. °· Standing in one place for too long. °· Seizure disorders. °· Dehydration and excessive exposure to heat. °· Low blood sugar (hypoglycemia). °· Straining to have a bowel movement. °· Heart disease, irregular heartbeat, or other circulatory problems. °· Fear, emotional distress, seeing blood, or severe pain. °SYMPTOMS  °Right before fainting, you may: °· Feel dizzy or light-headed. °· Feel nauseous. °· See all white or all black in your field of vision. °· Have cold, clammy skin. °DIAGNOSIS  °Your health care provider will ask about your symptoms, perform a physical exam, and perform an electrocardiogram (ECG) to record the electrical activity of your heart. Your health care provider may also perform other heart or blood tests to determine the cause of your syncope which may include: °· Transthoracic echocardiogram (TTE). During echocardiography, sound waves are used to evaluate how blood flows through your heart. °· Transesophageal echocardiogram (TEE). °· Cardiac monitoring. This allows your health care provider to monitor your heart rate and rhythm in real time. °· Holter monitor. This is a portable device that records your  heartbeat and can help diagnose heart arrhythmias. It allows your health care provider to track your heart activity for several days, if needed. °· Stress tests by exercise or by giving medicine that makes the heart beat faster. °TREATMENT  °In most cases, no treatment is needed. Depending on the cause of your syncope, your health care provider may recommend changing or stopping some of your medicines. °HOME CARE INSTRUCTIONS °· Have someone stay with you until you feel stable. °· Do not drive, use machinery, or play sports until your health care provider says it is okay. °· Keep all follow-up appointments as directed by your health care provider. °· Lie down right away if you start feeling like you might faint. Breathe deeply and steadily. Wait until all the symptoms have passed. °· Drink enough fluids to keep your urine clear or pale yellow. °· If you are taking blood pressure or heart medicine, get up slowly and take several minutes to sit and then stand. This can reduce dizziness. °SEEK IMMEDIATE MEDICAL CARE IF:  °· You have a severe headache. °· You have unusual pain in the chest, abdomen, or back. °· You are bleeding from your mouth or rectum, or you have black or tarry stool. °· You have an irregular or very fast heartbeat. °· You have pain with breathing. °· You have repeated fainting or seizure-like jerking during an episode. °· You faint when sitting or lying down. °· You have confusion. °· You have trouble walking. °· You have severe weakness. °· You have vision problems. °If you fainted, call your local emergency services (911 in [place] Do not drive   yourself to the hospital.  °MAKE SURE YOU: °· Understand these instructions. °· Will watch your condition. °· Will get help right away if you are not doing well or get worse. °Document Released: 03/26/2005 Document Revised: 03/31/2013 Document Reviewed: 05/25/2011 °ExitCare® Patient Information ©2015 [employer]. This information is not intended to replace  advice given to you by your health care provider. Make sure you discuss any questions you have with your health care provider. ° °

## 2014-11-05 NOTE — ED Notes (Signed)
Discharge instructions given, pt demonstrated teach back and verbal understanding. No concerns voiced.  

## 2014-12-25 ENCOUNTER — Inpatient Hospital Stay (HOSPITAL_COMMUNITY)
Admission: EM | Admit: 2014-12-25 | Discharge: 2014-12-30 | DRG: 418 | Disposition: A | Discharge status: Home / Self Care | Payer: Medicaid Other | Attending: Internal Medicine | Admitting: Internal Medicine

## 2014-12-25 ENCOUNTER — Emergency Department (HOSPITAL_COMMUNITY): Payer: Medicaid Other

## 2014-12-25 ENCOUNTER — Encounter (HOSPITAL_COMMUNITY): Payer: Self-pay | Admitting: Nurse Practitioner

## 2014-12-25 DIAGNOSIS — F17210 Nicotine dependence, cigarettes, uncomplicated: Secondary | ICD-10-CM | POA: Diagnosis present

## 2014-12-25 DIAGNOSIS — K831 Obstruction of bile duct: Secondary | ICD-10-CM

## 2014-12-25 DIAGNOSIS — K8063 Calculus of gallbladder and bile duct with acute cholecystitis with obstruction: Principal | ICD-10-CM | POA: Diagnosis present

## 2014-12-25 DIAGNOSIS — Z23 Encounter for immunization: Secondary | ICD-10-CM

## 2014-12-25 DIAGNOSIS — Z86711 Personal history of pulmonary embolism: Secondary | ICD-10-CM

## 2014-12-25 DIAGNOSIS — F1010 Alcohol abuse, uncomplicated: Secondary | ICD-10-CM | POA: Diagnosis present

## 2014-12-25 DIAGNOSIS — D6851 Activated protein C resistance: Secondary | ICD-10-CM | POA: Diagnosis present

## 2014-12-25 DIAGNOSIS — Z7901 Long term (current) use of anticoagulants: Secondary | ICD-10-CM

## 2014-12-25 DIAGNOSIS — R7989 Other specified abnormal findings of blood chemistry: Secondary | ICD-10-CM

## 2014-12-25 DIAGNOSIS — R112 Nausea with vomiting, unspecified: Secondary | ICD-10-CM | POA: Diagnosis present

## 2014-12-25 DIAGNOSIS — Z91018 Allergy to other foods: Secondary | ICD-10-CM

## 2014-12-25 DIAGNOSIS — K810 Acute cholecystitis: Secondary | ICD-10-CM

## 2014-12-25 DIAGNOSIS — K8050 Calculus of bile duct without cholangitis or cholecystitis without obstruction: Secondary | ICD-10-CM | POA: Diagnosis present

## 2014-12-25 DIAGNOSIS — R945 Abnormal results of liver function studies: Secondary | ICD-10-CM

## 2014-12-25 LAB — CBC
HEMATOCRIT: 38.6 % (ref 36.0–46.0)
HEMOGLOBIN: 12.9 g/dL (ref 12.0–15.0)
MCH: 30.1 pg (ref 26.0–34.0)
MCHC: 33.4 g/dL (ref 30.0–36.0)
MCV: 90.2 fL (ref 78.0–100.0)
Platelets: 319 10*3/uL (ref 150–400)
RBC: 4.28 MIL/uL (ref 3.87–5.11)
RDW: 14.3 % (ref 11.5–15.5)
WBC: 9.3 10*3/uL (ref 4.0–10.5)

## 2014-12-25 LAB — COMPREHENSIVE METABOLIC PANEL
ALBUMIN: 3 g/dL — AB (ref 3.5–5.0)
ALK PHOS: 1332 U/L — AB (ref 38–126)
ALT: 546 U/L — ABNORMAL HIGH (ref 14–54)
ANION GAP: 7 (ref 5–15)
AST: 454 U/L — ABNORMAL HIGH (ref 15–41)
BUN: 11 mg/dL (ref 6–20)
CHLORIDE: 102 mmol/L (ref 101–111)
CO2: 26 mmol/L (ref 22–32)
Calcium: 9.3 mg/dL (ref 8.9–10.3)
Creatinine, Ser: 0.83 mg/dL (ref 0.44–1.00)
GFR calc Af Amer: >60 mL/min (ref >60)
GFR calc non Af Amer: >60 mL/min (ref >60)
GLUCOSE: 94 mg/dL (ref 65–99)
POTASSIUM: 4 mmol/L (ref 3.5–5.1)
SODIUM: 135 mmol/L (ref 135–145)
Total Bilirubin: 15.1 mg/dL — ABNORMAL HIGH (ref 0.3–1.2)
Total Protein: 6.6 g/dL (ref 6.5–8.1)

## 2014-12-25 LAB — I-STAT BETA HCG BLOOD, ED (MC, WL, AP ONLY)

## 2014-12-25 LAB — URINALYSIS, ROUTINE W REFLEX MICROSCOPIC
GLUCOSE, UA: NEGATIVE mg/dL
Hgb urine dipstick: NEGATIVE
Ketones, ur: 15 mg/dL — AB
Nitrite: NEGATIVE
Protein, ur: NEGATIVE mg/dL
SPECIFIC GRAVITY, URINE: 1.022 (ref 1.005–1.030)
Urobilinogen, UA: 0.2 mg/dL (ref 0.0–1.0)
pH: 6 (ref 5.0–8.0)

## 2014-12-25 LAB — I-STAT CG4 LACTIC ACID, ED
LACTIC ACID, VENOUS: 0.77 mmol/L (ref 0.5–2.0)
LACTIC ACID, VENOUS: 0.5 mmol/L (ref 0.5–2.0)

## 2014-12-25 LAB — LIPASE, BLOOD: LIPASE: 282 U/L — AB (ref 22–51)

## 2014-12-25 LAB — ACETAMINOPHEN LEVEL

## 2014-12-25 LAB — RAPID URINE DRUG SCREEN, HOSP PERFORMED
Amphetamines: NOT DETECTED
BARBITURATES: NOT DETECTED
BENZODIAZEPINES: NOT DETECTED
Cocaine: NOT DETECTED
Opiates: NOT DETECTED
Tetrahydrocannabinol: NOT DETECTED

## 2014-12-25 LAB — ETHANOL

## 2014-12-25 LAB — PROTIME-INR
INR: 1.16 (ref 0.00–1.49)
PROTHROMBIN TIME: 15 s (ref 11.6–15.2)

## 2014-12-25 LAB — APTT: APTT: 29 s (ref 24–37)

## 2014-12-25 LAB — URINE MICROSCOPIC-ADD ON

## 2014-12-25 MED ORDER — LORAZEPAM 2 MG/ML IJ SOLN: 1.0000 mg | Freq: Four times a day (QID) | INTRAMUSCULAR | Status: AC | PRN

## 2014-12-25 MED ORDER — HEPARIN (PORCINE) IN NACL 100-0.45 UNIT/ML-% IJ SOLN
1000.0000 [IU]/h | INTRAMUSCULAR | Status: DC
  Administered 2014-12-25: 1000 [IU]/h via INTRAVENOUS
  Filled 2014-12-25: qty 250

## 2014-12-25 MED ORDER — SODIUM CHLORIDE 0.9 % IV BOLUS (SEPSIS)
1000.0000 mL | Freq: Once | INTRAVENOUS | Status: AC
  Administered 2014-12-25: 1000 mL via INTRAVENOUS

## 2014-12-25 MED ORDER — PROMETHAZINE HCL 25 MG/ML IJ SOLN
25.0000 mg | Freq: Once | INTRAMUSCULAR | Status: DC
  Filled 2014-12-25: qty 1

## 2014-12-25 MED ORDER — VITAMIN B-1 100 MG PO TABS
100.0000 mg | ORAL_TABLET | Freq: Every day | ORAL | Status: DC
  Administered 2014-12-25 – 2014-12-30 (×5): 100 mg via ORAL
  Filled 2014-12-25 (×5): qty 1

## 2014-12-25 MED ORDER — HEPARIN BOLUS VIA INFUSION
2000.0000 [IU] | Freq: Once | INTRAVENOUS | Status: AC
  Administered 2014-12-25: 2000 [IU] via INTRAVENOUS
  Filled 2014-12-25: qty 2000

## 2014-12-25 MED ORDER — IOHEXOL 300 MG/ML  SOLN
100.0000 mL | Freq: Once | INTRAMUSCULAR | Status: AC | PRN
  Administered 2014-12-25: 100 mL via INTRAVENOUS

## 2014-12-25 MED ORDER — DEXTROSE-NACL 5-0.9 % IV SOLN
INTRAVENOUS | Status: DC
  Administered 2014-12-25 – 2014-12-26 (×2): via INTRAVENOUS
  Administered 2014-12-27: 1000 mL via INTRAVENOUS
  Administered 2014-12-28: 15:00:00 via INTRAVENOUS
  Administered 2014-12-29: 1000 mL via INTRAVENOUS

## 2014-12-25 MED ORDER — LORAZEPAM 1 MG PO TABS: 1.0000 mg | ORAL_TABLET | Freq: Four times a day (QID) | ORAL | Status: AC | PRN

## 2014-12-25 MED ORDER — THIAMINE HCL 100 MG/ML IJ SOLN: 100.0000 mg | Freq: Every day | INTRAMUSCULAR | Status: DC

## 2014-12-25 NOTE — ED Notes (Addendum)
Sister-in-law called RN to room when pt was at CT.  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  States pt, though denying to staff, is a heavy drinker.  Drinks at least a case of beer on week-ends, if not all week.  Unsure if pt does drugs, "but people she hangs with are drug addicts".  Sister-in-law did not want [place] to let pt know she told us info. MD notified.

## 2014-12-25 NOTE — ED Provider Notes (Signed)
CSN: [id number]     Arrival date & time 12/25/14  1448 History   First MD Initiated Contact with Patient 12/25/14 1711     Chief Complaint  Patient presents with  . Emesis     (Consider location/radiation/quality/duration/timing/severity/associated sxs/prior Treatment) HPI Comments: The patient is a 29 year old female who states that she is very healthy except for having factor V deficiency, she is currently taking Lovenox injections. She presents with one week of abdominal discomfort, persistent nausea and vomiting and significant jaundice which has occurred over the last week. She's reports that last week she had eaten at a [employer] after which both she and her significant other became very nauseated had abdominal pain and had 24 hours of vomiting. She was violently ill, this has improved but her symptoms have persisted. She denies any changes in bowel habits, no changes in urinary habits, she has been seen by her family doctor and evaluated with an ultrasound, lab work and an acute hepatitis panel none of which showed an answer to the patient's symptoms, she was referred to gastroenterology, she has not seen them at this time. She reports that her nausea and vomiting is worsening, she vomits every time she tries to eat. We do not have access to any of these records at this time.  Patient is a 29 y.o. female presenting with vomiting. The history is provided by the patient.  Emesis   Past Medical History  Diagnosis Date  . LGSIL (low grade squamous intraepithelial dysplasia) 12-07 &3-08    C&B 3-08/CONDYLOMA TREATED 06-19-06  . Complete miscarriage 09-12-06  . ADD (attention deficit disorder)   . Factor V deficiency   . Pulmonary embolus     age 48  . Ovarian cyst    Past Surgical History  Procedure Laterality Date  . Incise and drain abcess  11-16-09    LEFT LABIA  . Augmentation mammaplasty  08/2009  . Tonsillectomy    . Wisdom tooth extraction     Family History   Problem Relation Age of Onset  . Cancer Maternal Grandmother     skin  . Diabetes Maternal Grandmother   . Hypertension Maternal Grandfather   . Breast cancer Paternal Grandmother   . Cancer Paternal Grandfather     ? colon   Social History  Substance Use Topics  . Smoking status: Current Some Day Smoker  . Smokeless tobacco: Never Used  . Alcohol Use: Yes     Comment: once a week   OB History    Gravida Para Term Preterm AB TAB SAB Ectopic Multiple Living   3 2 2  1  1   1      Review of Systems  Gastrointestinal: Positive for vomiting.  All other systems reviewed and are negative.     Allergies  Caffeine and Peanut butter flavor  Home Medications   Prior to Admission medications   Medication Sig Start Date End Date Taking? Authorizing Crucita Lacorte  enoxaparin (LOVENOX) 40 MG/0.4ML injection Inject 40 mg into the skin daily.   Yes Historical Daly Whipkey, MD  ibuprofen (ADVIL,MOTRIN) 200 MG tablet Take 800 mg by mouth every 8 (eight) hours as needed.   Yes Historical Carlicia Leavens, MD  ondansetron (ZOFRAN) 8 MG tablet Take 8 mg by mouth every 8 (eight) hours as needed for nausea or vomiting.   Yes Historical Tymeshia Awan, MD  oxyCODONE-acetaminophen (PERCOCET/ROXICET) 5-325 MG per tablet Take 1 tablet by mouth every 6 (six) hours as needed for severe pain. Patient taking differently: Take  1 tablet by mouth at bedtime.  09/15/14  Yes Shon Baton, MD  promethazine (PHENERGAN) 25 MG tablet Take 25 mg by mouth every evening.   Yes Historical Shaughnessy Gethers, MD  traMADol (ULTRAM) 50 MG tablet Take 50 mg by mouth every 12 (twelve) hours as needed for moderate pain.   Yes Historical Daisa Stennis, MD   BP 98/58 mmHg  Pulse 108  Temp(Src) 98.3 F (36.8 C) (Oral)  Resp 18  Ht 5\' 10"  (1.778 m)  Wt 137 lb 14.4 oz (62.551 kg)  BMI 19.79 kg/m2  SpO2 98%  LMP 12/06/2014 Physical Exam  Constitutional: She appears well-developed and well-nourished. No distress.  HENT:  Head: Normocephalic and  atraumatic.  Mouth/Throat: Oropharynx is clear and moist. No oropharyngeal exudate.  Mucous membranes jaundiced  Eyes: EOM are normal. Pupils are equal, round, and reactive to light. Right eye exhibits no discharge. Left eye exhibits no discharge. Scleral icterus is present.  Neck: Normal range of motion. Neck supple. No JVD present. No thyromegaly present.  Cardiovascular: Normal rate, regular rhythm, normal heart sounds and intact distal pulses.  Exam reveals no gallop and no friction rub.   No murmur heard. Pulmonary/Chest: Effort normal and breath sounds normal. No respiratory distress. She has no wheezes. She has no rales.  Abdominal: Soft. Bowel sounds are normal. She exhibits no distension and no mass. There is tenderness (tender to palpation across the upper abdomen in the right upper, epigastric and left upper quadrant).  Musculoskeletal: Normal range of motion. She exhibits no edema or tenderness.  Lymphadenopathy:    She has no cervical adenopathy.  Neurological: She is alert. Coordination normal.  Skin: Skin is warm and dry. No rash noted. No erythema.  Psychiatric: She has a normal mood and affect. Her behavior is normal.  Nursing note and vitals reviewed.   ED Course  Procedures (including critical care time) Labs Review Labs Reviewed  COMPREHENSIVE METABOLIC PANEL - Abnormal; Notable for the following:    Albumin 3.0 (*)    AST 454 (*)    ALT 546 (*)    Alkaline Phosphatase 1332 (*)    Total Bilirubin 15.1 (*)    All other components within normal limits  URINALYSIS, ROUTINE W REFLEX MICROSCOPIC (NOT AT [facility]) - Abnormal; Notable for the following:    Color, Urine ORANGE (*)    Bilirubin Urine LARGE (*)    Ketones, ur 15 (*)    Leukocytes, UA SMALL (*)    All other components within normal limits  LIPASE, BLOOD - Abnormal; Notable for the following:    Lipase 282 (*)    All other components within normal limits  URINE MICROSCOPIC-ADD ON - Abnormal; Notable for the  following:    Squamous Epithelial / LPF FEW (*)    All other components within normal limits  ACETAMINOPHEN LEVEL - Abnormal; Notable for the following:    Acetaminophen (Tylenol), Serum <10 (*)    All other components within normal limits  CBC  ETHANOL  HEPATITIS PANEL, ACUTE  PROTIME-INR  APTT  URINE RAPID DRUG SCREEN, HOSP PERFORMED  I-STAT BETA HCG BLOOD, ED ([facility], [facility], [facility] ONLY)  I-STAT CG4 LACTIC ACID, ED  I-STAT CG4 LACTIC ACID, ED    Imaging Review Ct Abdomen Pelvis W Contrast  12/25/2014   CLINICAL DATA:  29 yr old female with LUQ pain, extreme nausea, vomiting, and worsening jaundice. Patient states she recently was diagnosised with Liver disease but not told what or given any treatement.  EXAM: CT  ABDOMEN AND PELVIS WITH CONTRAST  TECHNIQUE: Multidetector CT imaging of the abdomen and pelvis was performed using the standard protocol following bolus administration of intravenous contrast.  CONTRAST:  OMNIPAQUE IOHEXOL 300 MG/ML  SOLN  COMPARISON:  None.  FINDINGS: Lung bases:  Clear.  Heart size.  Gallbladder and biliary tree: There is a peripherally calcified at 12 mm stone in the distal common bile duct. This leads to intra and extrahepatic bile duct dilation. Common bile duct measures 11 mm in the pancreatic head and 14 mm in the porta hepatis. There is diffuse gallbladder wall thickening. No stone is seen within the gallbladder. There is also dilation of the pancreatic duct measuring 5 mm.  Liver: Normal in size and morphology. No mass or focal lesion. Intrahepatic duct dilation as described above.  Spleen: Unremarkable.  Pancreas: No mass or inflammatory changes. Duct dilation as detailed above.  Adrenal glands: No masses.  Kidneys, ureters, bladder:  Normal.  Uterus and adnexa: Uterus unremarkable. Apparent 2 cm paraovarian cyst. Ovaries unremarkable. Trace pelvic free fluid.  Lymph nodes:  No adenopathy.  Gastrointestinal: Unremarkable. Appendix not visualized. No evidence of  appendicitis.  Musculoskeletal:  Unremarkable.  IMPRESSION: 1. 12 mm stone lies in the distal common bile duct causing significant intern extrahepatic bile duct dilation as well as diffuse gallbladder wall thickening/edema. In addition, stone leads to milder dilation of the pancreatic duct. 2. No radiopaque stones are seen within the gallbladder. 3. No other acute finding.   Electronically Signed   By: Amie Portland M.D.   On: 12/25/2014 19:30   I have personally reviewed and evaluated these images and lab results as part of my medical decision-making.   MDM   Final diagnoses:  Common bile duct obstruction  Acute cholecystitis    The patient is well-appearing however she has significant tenderness in the abdomen and very abnormal lab work with liver function tests that are in the 500 range and alkaline phosphatase of 1300. She will need repeat imaging today, likely admission to the hospital due to a bilirubin of 15, etiology I would consider pancreatitis, cholecystitis, gallstone pancreatitis, acute hepatitis.  Lab work is significantly abnormal showing significant elevation in liver function, lipase is elevated, lactic acid is normal as is the white blood cell count. She is afebrile, she will be nothing by mouth and she has significant need for ERCP in the morning. I discussed the care with the gastroenterologist as well as with the hospitalist, the hospitalist will admit to the hospital.  Meds given in ED:  Medications  promethazine (PHENERGAN) injection 25 mg (25 mg Intravenous Not Given 12/25/14 1755)  sodium chloride 0.9 % bolus 1,000 mL (1,000 mLs Intravenous New Bag/Given 12/25/14 1746)  iohexol (OMNIPAQUE) 300 MG/ML solution 100 mL (100 mLs Intravenous Contrast Given 12/25/14 1909)     Eber Hong, MD 12/25/14 2033

## 2014-12-25 NOTE — Progress Notes (Signed)
Consulted pharmacy to see if D5NS is compatible with heparin.  Per the pharmacist, it is compatible.

## 2014-12-25 NOTE — ED Notes (Signed)
Pt is requesting something to eat 

## 2014-12-25 NOTE — H&P (Addendum)
History and Physical  Peggy [name] ERX:[id number] DOB: [birth year] DOA: 12/25/2014  Referring physician: EDP PCP: Pershing Proud   Chief Complaint: n/v/ab pain with oral intake  HPI: Peggy [name] is a 29 y.o. female   With h/o factor V leiden , h/o PE when she was 21 , has been on chronic anticoagulation since then, presented to the ED due to n/v/ab pain with oral intake for a week, she reported that she was evaluated by her pmd in [place] was first diagnosed with stomach virus, then was told the elevated liver function was due to drinking alcohol, her symptom did not improve, she became visible jaundice.   in the ED here CT ab showed CBD stone, elevated lft with tbilib of 15. EDP talked to GI Dr. Retia Passe who recommended keep npo, ERCP in am.  Currently patient is vital stable, denies pain, no n/v, no fever, wanting to eat.  Review of Systems:  Detail per HPI, Review of systems are otherwise negative  Past Medical History  Diagnosis Date  . LGSIL (low grade squamous intraepithelial dysplasia) 12-07 &3-08    C&B 3-08/CONDYLOMA TREATED 06-19-06  . Complete miscarriage 09-12-06  . ADD (attention deficit disorder)   . Factor V deficiency   . Pulmonary embolus     age 38  . Ovarian cyst    Past Surgical History  Procedure Laterality Date  . Incise and drain abcess  11-16-09    LEFT LABIA  . Augmentation mammaplasty  08/2009  . Tonsillectomy    . Wisdom tooth extraction     Social History:  reports that she has been smoking.  She has never used smokeless tobacco. She reports that she drinks alcohol. She reports that she does not use illicit drugs. Patient lives at home & is able to participate in activities of daily living independently   Allergies  Allergen Reactions  . Caffeine Anaphylaxis  . Peanut Butter Flavor Anaphylaxis    Family History  Problem Relation Age of Onset  . Cancer Maternal Grandmother     skin  . Diabetes Maternal Grandmother   .  Hypertension Maternal Grandfather   . Breast cancer Paternal Grandmother   . Cancer Paternal Grandfather     ? colon      Prior to Admission medications   Medication Sig Start Date End Date Taking? Authorizing Peggy [name]  enoxaparin (LOVENOX) 40 MG/0.4ML injection Inject 40 mg into the skin daily.   Yes Historical Peggy Corvin, MD  ibuprofen (ADVIL,MOTRIN) 200 MG tablet Take 800 mg by mouth every 8 (eight) hours as needed.   Yes Historical Peggy Tennison, MD  ondansetron (ZOFRAN) 8 MG tablet Take 8 mg by mouth every 8 (eight) hours as needed for nausea or vomiting.   Yes Historical Peggy Giarratano, MD  oxyCODONE-acetaminophen (PERCOCET/ROXICET) 5-325 MG per tablet Take 1 tablet by mouth every 6 (six) hours as needed for severe pain. Patient taking differently: Take 1 tablet by mouth at bedtime.  09/15/14  Yes Peggy Baton, MD  promethazine (PHENERGAN) 25 MG tablet Take 25 mg by mouth every evening.   Yes Historical Peggy Perlow, MD  traMADol (ULTRAM) 50 MG tablet Take 50 mg by mouth every 12 (twelve) hours as needed for moderate pain.   Yes Historical Peggy Mullens, MD    Physical Exam: BP 98/58 mmHg  Pulse 108  Temp(Src) 98.3 F (36.8 C) (Oral)  Resp 18  Ht  (1.778 m)  Wt 137 lb 14.4 oz (62.551 kg)  BMI 19.79 kg/m2  SpO2 98%  LMP 12/06/2014  General:  +jaundice Eyes: +icterus ENT: unremarkable Neck: supple, no JVD Cardiovascular: RRR Respiratory: CTABL Abdomen: soft/NT/ND, positive bowel sounds Skin: no rash Musculoskeletal:  No edema Psychiatric: calm/cooperative Neurologic: no focal findings            Labs on Admission:  Basic Metabolic Panel:  Recent Labs Lab 12/25/14 1531  NA 135  K 4.0  CL 102  CO2 26  GLUCOSE 94  BUN 11  CREATININE 0.83  CALCIUM 9.3   Liver Function Tests:  Recent Labs Lab 12/25/14 1531  AST 454*  ALT 546*  ALKPHOS 1332*  BILITOT 15.1*  PROT 6.6  ALBUMIN 3.0*    Recent Labs Lab 12/25/14 1755  LIPASE 282*   No results for input(s):  AMMONIA in the last 168 hours. CBC:  Recent Labs Lab 12/25/14 1531  WBC 9.3  HGB 12.9  HCT 38.6  MCV 90.2  PLT 319   Cardiac Enzymes: No results for input(s): CKTOTAL, CKMB, CKMBINDEX, TROPONINI in the last 168 hours.  BNP (last 3 results)  Recent Labs  09/14/14 2250  BNP 28.9    ProBNP (last 3 results) No results for input(s): PROBNP in the last 8760 hours.  CBG: No results for input(s): GLUCAP in the last 168 hours.  Radiological Exams on Admission: Ct Abdomen Pelvis W Contrast  12/25/2014   CLINICAL DATA:  29 yr old female with LUQ pain, extreme nausea, vomiting, and worsening jaundice. Patient states she recently was diagnosised with Liver disease but not told what or given any treatement.  EXAM: CT ABDOMEN AND PELVIS WITH CONTRAST  TECHNIQUE: Multidetector CT imaging of the abdomen and pelvis was performed using the standard protocol following bolus administration of intravenous contrast.  CONTRAST:  OMNIPAQUE IOHEXOL 300 MG/ML  SOLN  COMPARISON:  None.  FINDINGS: Lung bases:  Clear.  Heart size.  Gallbladder and biliary tree: There is a peripherally calcified at 12 mm stone in the distal common bile duct. This leads to intra and extrahepatic bile duct dilation. Common bile duct measures 11 mm in the pancreatic head and 14 mm in the porta hepatis. There is diffuse gallbladder wall thickening. No stone is seen within the gallbladder. There is also dilation of the pancreatic duct measuring 5 mm.  Liver: Normal in size and morphology. No mass or focal lesion. Intrahepatic duct dilation as described above.  Spleen: Unremarkable.  Pancreas: No mass or inflammatory changes. Duct dilation as detailed above.  Adrenal glands: No masses.  Kidneys, ureters, bladder:  Normal.  Uterus and adnexa: Uterus unremarkable. Apparent 2 cm paraovarian cyst. Ovaries unremarkable. Trace pelvic free fluid.  Lymph nodes:  No adenopathy.  Gastrointestinal: Unremarkable. Appendix not visualized. No  evidence of appendicitis.  Musculoskeletal:  Unremarkable.  IMPRESSION: 1. 12 mm stone lies in the distal common bile duct causing significant intern extrahepatic bile duct dilation as well as diffuse gallbladder wall thickening/edema. In addition, stone leads to milder dilation of the pancreatic duct. 2. No radiopaque stones are seen within the gallbladder. 3. No other acute finding.   Electronically Signed   By: Amie Portland M.D.   On: 12/25/2014 19:30     Assessment/Plan Present on Admission:  . Choledocholithiasis   CBD stone: ERCP in am.  ivf and symptomatic management.  Elevation of lft, likely secondary to CBD stone, hepatitis panel pending.  Elevation of lipase, likely from CBD stone, lipid panel ordered.  H/o factor V leiden/PE on Chronic anticoagulation: last dose of lovenox this am, start  heparin drip. Pharmacy to coordinate heparin drip with endosuite,  GI aware.   Alcohol use, patient denies heavy drinking though, she reports she does not drink daily, she only drinks on the weekend. Will start ciwa protocol. uds negative.  DVT prophylaxis: heparin drip  Consultants: GI  Code Status: full   Family Communication:  Patient and sister in law  Disposition Plan: admit to med surg  Time spent:  Xu,Fang MD, PhD [facility] Pager [phone] If 7PM-7AM, please contact night-coverage at [email or web address], password [facility]

## 2014-12-25 NOTE — Progress Notes (Signed)
ANTICOAGULATION CONSULT NOTE - Initial Consult  Pharmacy Consult for Heparin Indication: h/o PE  Allergies  Allergen Reactions  . Caffeine Anaphylaxis  . Peanut Butter Flavor Anaphylaxis    Patient Measurements: Height:  (177.8 cm) Weight: 140 lb 9.6 oz (63.776 kg) IBW/kg (Calculated) : 68.5 Heparin Dosing Weight: 64 kg  Vital Signs: Temp: 98.9 F (37.2 C) (09/17 2150) Temp Source: Oral (09/17 2150) BP: 121/73 mmHg (09/17 2150) Pulse Rate: 76 (09/17 2150)  Labs:  Recent Labs  12/25/14 1531 12/25/14 2035  HGB 12.9  --   HCT 38.6  --   PLT 319  --   APTT  --  29  LABPROT  --  15.0  INR  --  1.16  CREATININE 0.83  --     Estimated Creatinine Clearance: 101.6 mL/min (by C-G formula based on Cr of 0.83).   Medical History: Past Medical History  Diagnosis Date  . LGSIL (low grade squamous intraepithelial dysplasia) 12-07 &3-08    C&B 3-08/CONDYLOMA TREATED 06-19-06  . Complete miscarriage 09-12-06  . ADD (attention deficit disorder)   . Factor V deficiency   . Pulmonary embolus     age 73  . Ovarian cyst     Medications:  Prescriptions prior to admission  Medication Sig Dispense Refill Last Dose  . enoxaparin (LOVENOX) 40 MG/0.4ML injection Inject 40 mg into the skin daily.   12/25/2014 at 730  . ibuprofen (ADVIL,MOTRIN) 200 MG tablet Take 800 mg by mouth every 8 (eight) hours as needed.   Past Week at Unknown time  . ondansetron (ZOFRAN) 8 MG tablet Take 8 mg by mouth every 8 (eight) hours as needed for nausea or vomiting.   12/24/2014 at Unknown time  . oxyCODONE-acetaminophen (PERCOCET/ROXICET) 5-325 MG per tablet Take 1 tablet by mouth every 6 (six) hours as needed for severe pain. (Patient taking differently: Take 1 tablet by mouth at bedtime. ) 10 tablet 0 12/24/2014 at Unknown time  . promethazine (PHENERGAN) 25 MG tablet Take 25 mg by mouth every evening.   12/24/2014 at Unknown time  . traMADol (ULTRAM) 50 MG tablet Take 50 mg by mouth every 12  (twelve) hours as needed for moderate pain.   12/25/2014 at Unknown time   Scheduled:  . promethazine  25 mg Intravenous Once  . thiamine  100 mg Oral Daily   Or  . thiamine  100 mg Intravenous Daily   Infusions:    Assessment: 29yo female with history of PE and factor V deficiency presents with N/V and abdominal pain for 1 week. Pharmacy is consulted to dose heparin for history of PE before patient goes for ERCP in the morning. Pt administered lovenox  this morning at 0730. CBC is wnl, sCr 0.83. Will stop heparin drip 6h prior to ERCP scheduled for 0950 9/18.  Goal of Therapy:  Heparin level 0.3-0.7 units/ml Monitor platelets by anticoagulation protocol: Yes   Plan:  Give 2000 units bolus x 1 Start heparin infusion at 1000 units/hr Check anti-Xa level in 6 hours and daily while on heparin Continue to monitor H&H and platelets  Arlean Hopping. Newman Pies, PharmD Clinical Pharmacist Pager [phone] 12/25/2014,10:01 PM

## 2014-12-25 NOTE — ED Notes (Signed)
She diagnosed with liver disease this week by her PCP and is awaiting follow up appointments but she continues to feel worse over this week with increased n/v/abd pain. She is jaundiced, sclera yellowed. She is A&Ox4, resp e/u

## 2014-12-26 ENCOUNTER — Inpatient Hospital Stay (HOSPITAL_COMMUNITY): Payer: Medicaid Other | Admitting: Anesthesiology

## 2014-12-26 ENCOUNTER — Encounter (HOSPITAL_COMMUNITY)
Admission: EM | Disposition: A | Discharge status: Home / Self Care | Payer: Self-pay | Source: Home / Self Care | Attending: Internal Medicine

## 2014-12-26 ENCOUNTER — Encounter (HOSPITAL_COMMUNITY): Payer: Self-pay

## 2014-12-26 ENCOUNTER — Inpatient Hospital Stay (HOSPITAL_COMMUNITY): Payer: Medicaid Other

## 2014-12-26 DIAGNOSIS — K8050 Calculus of bile duct without cholangitis or cholecystitis without obstruction: Secondary | ICD-10-CM

## 2014-12-26 DIAGNOSIS — R740 Nonspecific elevation of levels of transaminase and lactic acid dehydrogenase [LDH]: Secondary | ICD-10-CM

## 2014-12-26 DIAGNOSIS — D682 Hereditary deficiency of other clotting factors: Secondary | ICD-10-CM

## 2014-12-26 HISTORY — PX: ERCP: SHX5425

## 2014-12-26 LAB — COMPREHENSIVE METABOLIC PANEL
ALBUMIN: 2.6 g/dL — AB (ref 3.5–5.0)
ALT: 506 U/L — AB (ref 14–54)
ANION GAP: 6 (ref 5–15)
AST: 389 U/L — ABNORMAL HIGH (ref 15–41)
Alkaline Phosphatase: 1208 U/L — ABNORMAL HIGH (ref 38–126)
BUN: 8 mg/dL (ref 6–20)
CHLORIDE: 106 mmol/L (ref 101–111)
CO2: 25 mmol/L (ref 22–32)
Calcium: 8.7 mg/dL — ABNORMAL LOW (ref 8.9–10.3)
Creatinine, Ser: 0.74 mg/dL (ref 0.44–1.00)
GFR calc non Af Amer: >60 mL/min (ref >60)
Glucose, Bld: 99 mg/dL (ref 65–99)
Potassium: 4.5 mmol/L (ref 3.5–5.1)
SODIUM: 137 mmol/L (ref 135–145)
Total Bilirubin: 12.8 mg/dL — ABNORMAL HIGH (ref 0.3–1.2)
Total Protein: 5.7 g/dL — ABNORMAL LOW (ref 6.5–8.1)

## 2014-12-26 LAB — HEPATITIS PANEL, ACUTE
HCV Ab: <0.1 s/co ratio (ref 0.0–0.9)
HEP B C IGM: NEGATIVE
HEP B S AG: NEGATIVE
Hep A IgM: NEGATIVE

## 2014-12-26 LAB — LIPID PANEL
Cholesterol: 447 mg/dL — ABNORMAL HIGH (ref 0–200)
HDL: <10 mg/dL — ABNORMAL LOW (ref >40)
Triglycerides: 117 mg/dL (ref <150)
VLDL: 23 mg/dL (ref 0–40)

## 2014-12-26 LAB — CBC
HCT: 35.4 % — ABNORMAL LOW (ref 36.0–46.0)
HEMOGLOBIN: 11.9 g/dL — AB (ref 12.0–15.0)
MCH: 30.5 pg (ref 26.0–34.0)
MCHC: 33.6 g/dL (ref 30.0–36.0)
MCV: 90.8 fL (ref 78.0–100.0)
Platelets: 285 10*3/uL (ref 150–400)
RBC: 3.9 MIL/uL (ref 3.87–5.11)
RDW: 14.4 % (ref 11.5–15.5)
WBC: 6.5 10*3/uL (ref 4.0–10.5)

## 2014-12-26 LAB — LACTIC ACID, PLASMA: LACTIC ACID, VENOUS: 0.7 mmol/L (ref 0.5–2.0)

## 2014-12-26 LAB — SURGICAL PCR SCREEN
MRSA, PCR: NEGATIVE
STAPHYLOCOCCUS AUREUS: NEGATIVE

## 2014-12-26 LAB — GLUCOSE, CAPILLARY: GLUCOSE-CAPILLARY: 72 mg/dL (ref 65–99)

## 2014-12-26 LAB — PREGNANCY, URINE: PREG TEST UR: NEGATIVE

## 2014-12-26 SURGERY — ERCP, WITH INTERVENTION IF INDICATED
Anesthesia: General

## 2014-12-26 SURGERY — ERCP, WITH INTERVENTION IF INDICATED
Anesthesia: Monitor Anesthesia Care

## 2014-12-26 MED ORDER — INFLUENZA VAC SPLIT QUAD 0.5 ML IM SUSY
0.5000 mL | PREFILLED_SYRINGE | INTRAMUSCULAR | Status: AC
  Administered 2014-12-30: 0.5 mL via INTRAMUSCULAR
  Filled 2014-12-26 (×2): qty 0.5

## 2014-12-26 MED ORDER — DEXTROSE 5 % IV SOLN
2.0000 g | INTRAVENOUS | Status: AC
  Administered 2014-12-27: 2 g via INTRAVENOUS
  Filled 2014-12-26: qty 2

## 2014-12-26 MED ORDER — PROMETHAZINE HCL 25 MG/ML IJ SOLN: 6.2500 mg | INTRAMUSCULAR | Status: DC | PRN

## 2014-12-26 MED ORDER — LACTATED RINGERS IV SOLN
INTRAVENOUS | Status: DC | PRN
  Administered 2014-12-26 (×2): via INTRAVENOUS

## 2014-12-26 MED ORDER — FENTANYL CITRATE (PF) 100 MCG/2ML IJ SOLN: 25.0000 ug | INTRAMUSCULAR | Status: DC | PRN

## 2014-12-26 MED ORDER — LIDOCAINE HCL (CARDIAC) 20 MG/ML IV SOLN
INTRAVENOUS | Status: DC | PRN
  Administered 2014-12-26: 80 mg via INTRAVENOUS

## 2014-12-26 MED ORDER — HEPARIN (PORCINE) IN NACL 100-0.45 UNIT/ML-% IJ SOLN: 1000.0000 [IU]/h | INTRAMUSCULAR | Status: DC

## 2014-12-26 MED ORDER — MIDAZOLAM HCL 5 MG/5ML IJ SOLN
INTRAMUSCULAR | Status: DC | PRN
  Administered 2014-12-26: 2 mg via INTRAVENOUS

## 2014-12-26 MED ORDER — PROPOFOL 10 MG/ML IV BOLUS
INTRAVENOUS | Status: DC | PRN
  Administered 2014-12-26: 140 mg via INTRAVENOUS

## 2014-12-26 MED ORDER — CIPROFLOXACIN IN D5W 400 MG/200ML IV SOLN
400.0000 mg | Freq: Two times a day (BID) | INTRAVENOUS | Status: DC
  Administered 2014-12-26: 400 mg via INTRAVENOUS

## 2014-12-26 MED ORDER — SUCCINYLCHOLINE CHLORIDE 20 MG/ML IJ SOLN
INTRAMUSCULAR | Status: DC | PRN
  Administered 2014-12-26: 100 mg via INTRAVENOUS

## 2014-12-26 MED ORDER — CIPROFLOXACIN IN D5W 400 MG/200ML IV SOLN
INTRAVENOUS | Status: AC
  Filled 2014-12-26: qty 200

## 2014-12-26 MED ORDER — FENTANYL CITRATE (PF) 100 MCG/2ML IJ SOLN
INTRAMUSCULAR | Status: DC | PRN
  Administered 2014-12-26: 100 ug via INTRAVENOUS
  Administered 2014-12-26: 50 ug via INTRAVENOUS

## 2014-12-26 MED ORDER — EPHEDRINE SULFATE 50 MG/ML IJ SOLN
INTRAMUSCULAR | Status: DC | PRN
  Administered 2014-12-26: 10 mg via INTRAVENOUS

## 2014-12-26 MED ORDER — SODIUM CHLORIDE 0.9 % IJ SOLN
INTRAMUSCULAR | Status: DC | PRN
  Administered 2014-12-26: 70 mL

## 2014-12-26 NOTE — Progress Notes (Signed)
The patient's heparin drip was stopped at 650.  Pharmacy and Dr. Adela Lank are aware.

## 2014-12-26 NOTE — Progress Notes (Signed)
Patient Demographics  Peggy [name], is a 29 y.o. female, DOB - 1985/12/16, ZOX:[id number]  Admit date - 12/25/2014   Admitting Physician Albertine Grates, MD  Outpatient Primary MD for the patient is Pershing Proud  LOS - 1   Chief Complaint  Patient presents with  . Emesis         Subjective:   Peggy [name] today has, No headache, No chest pain, No abdominal pain -No new weakness tingling or numbness, No Cough - SOB, no further nausea or vomiting since admission.   Assessment & Plan    Active Problems:   Choledocholithiasis  Choledocholithiasis - With significantly elevated LFTs, CT abdomen and pelvis with common bile duct dilatation with evidence of stone in distal bile duct. - s/p ERCP 9/18 by Dr. Madilyn Fireman,  with multiple gallstones drained. - Requested surgical consult for evaluation for need for cholecystectomy  Elevated LFTs - This is related to choledocholithiasis, recheck in a.m., negative hepatitis panel  H/o factor V leiden/PE on Chronic anticoagulation - Patient is on IV heparin drip, stop before ERCP procedure, discussed with Dr. Madilyn Fireman, patient with significant bleeding during the procedure, recommend to hold anticoagulation if possible, I think it would be appropriate to hold till a.m.Marland Kitchen  Alcohol use - on CIWA   Code Status: Full  Family Communication: Father and boyfriend at bedside  Disposition Plan: Home once stable   Procedures  ERCP 9/18 2016 by Dr. Madilyn Fireman   Consults   GI   Medications  Scheduled Meds: . [START ON 12/27/2014] Influenza vac split quadrivalent PF  0.5 mL Intramuscular Tomorrow-1000  . promethazine  25 mg Intravenous Once  . thiamine  100 mg Oral Daily   Or  . thiamine  100 mg Intravenous Daily   Continuous Infusions: . dextrose 5 % and 0.9% NaCl 75 mL/hr at 12/26/14 1301   PRN Meds:.fentaNYL (SUBLIMAZE) injection, LORazepam **OR**  LORazepam, promethazine  DVT Prophylaxis  on heparin drip  Lab Results  Component Value Date   PLT 285 12/26/2014    Antibiotics    Anti-infectives    Start     Dose/Rate Route Frequency Ordered Stop   12/26/14 1200  ciprofloxacin (CIPRO) IVPB 400 mg  Status:  Discontinued     400 mg 200 mL/hr over 60 Minutes Intravenous Every 12 hours 12/26/14 1040 12/26/14 1306          Objective:   Filed Vitals:   12/26/14 1017 12/26/14 1215 12/26/14 1230 12/26/14 1250  BP: 126/79 114/80 116/78 122/79  Pulse: 58 57 57 55  Temp:  98 F (36.7 C) 98.2 F (36.8 C) 98 F (36.7 C)  TempSrc: Oral     Resp: Height:      Weight:      SpO2: 99% 100% 100% 100%    Wt Readings from Last 3 Encounters:  12/25/14 63.776 kg (140 lb 9.6 oz)  11/04/14 64.411 kg (142 lb)  09/14/14 64.411 kg (142 lb)     Intake/Output Summary (Last 24 hours) at 12/26/14 1555 Last data filed at 12/26/14 1540  Gross per 24 hour  Intake 2067.67 ml  Output   1302 ml  Net 765.67 ml     Physical Exam  Awake Alert, Oriented  X 3, jaundiced [place].AT,PERRAL Supple Neck,No JVD, No cervical lymphadenopathy appriciated.  Symmetrical Chest wall movement, Good air movement bilaterally, CTAB RRR,No Gallops,Rubs or new Murmurs, No Parasternal Heave +ve B.Sounds, Abd Soft, mild tenderness in epigastric area, No organomegaly appriciated, No rebound - guarding or rigidity. No Cyanosis, Clubbing or edema, No new Rash or bruise     Data Review   Micro Results Recent Results (from the past 240 hour(s))  Surgical pcr screen     Status: None   Collection Time: 12/26/14  8:57 AM  Result Value Ref Range Status   MRSA, PCR NEGATIVE NEGATIVE Final   Staphylococcus aureus NEGATIVE NEGATIVE Final    Comment:        The Xpert SA Assay (FDA approved for NASAL specimens in patients over 31 years of age), is one component of a comprehensive surveillance program.  Test performance has been validated by  [facility] for patients greater than or equal to 8 year old. It is not intended to diagnose infection nor to guide or monitor treatment.     Radiology Reports Ct Abdomen Pelvis W Contrast  12/25/2014   CLINICAL DATA:  29 yr old female with LUQ pain, extreme nausea, vomiting, and worsening jaundice. Patient states she recently was diagnosised with Liver disease but not told what or given any treatement.  EXAM: CT ABDOMEN AND PELVIS WITH CONTRAST  TECHNIQUE: Multidetector CT imaging of the abdomen and pelvis was performed using the standard protocol following bolus administration of intravenous contrast.  CONTRAST:  OMNIPAQUE IOHEXOL 300 MG/ML  SOLN  COMPARISON:  None.  FINDINGS: Lung bases:  Clear.  Heart size.  Gallbladder and biliary tree: There is a peripherally calcified at 12 mm stone in the distal common bile duct. This leads to intra and extrahepatic bile duct dilation. Common bile duct measures 11 mm in the pancreatic head and 14 mm in the porta hepatis. There is diffuse gallbladder wall thickening. No stone is seen within the gallbladder. There is also dilation of the pancreatic duct measuring 5 mm.  Liver: Normal in size and morphology. No mass or focal lesion. Intrahepatic duct dilation as described above.  Spleen: Unremarkable.  Pancreas: No mass or inflammatory changes. Duct dilation as detailed above.  Adrenal glands: No masses.  Kidneys, ureters, bladder:  Normal.  Uterus and adnexa: Uterus unremarkable. Apparent 2 cm paraovarian cyst. Ovaries unremarkable. Trace pelvic free fluid.  Lymph nodes:  No adenopathy.  Gastrointestinal: Unremarkable. Appendix not visualized. No evidence of appendicitis.  Musculoskeletal:  Unremarkable.  IMPRESSION: 1. 12 mm stone lies in the distal common bile duct causing significant intern extrahepatic bile duct dilation as well as diffuse gallbladder wall thickening/edema. In addition, stone leads to milder dilation of the pancreatic duct. 2. No  radiopaque stones are seen within the gallbladder. 3. No other acute finding.   Electronically Signed   By: Amie Portland M.D.   On: 12/25/2014 19:30   Dg Ercp Biliary & Pancreatic Ducts  12/26/2014   CLINICAL DATA:  Fluoroscopic images from ERCP.  EXAM: ERCP performed by Dr. Levada Schilling.  TECHNIQUE: Multiple spot images obtained with the fluoroscopic device and submitted for interpretation post-procedure.  FLUOROSCOPY TIME:  Number of Acquired Images:  3  COMPARISON:  None.  FINDINGS: Three fluoroscopic images from ERCP demonstrate endoscope overlying the second portion of the duodenum. Contrast opacifies dilated common bile duct. 3 radiklucent filling defects are seen, probably representing stones within the proximal extrahepatic common bile duct.  IMPRESSION: Intra procedural fluoroscopic  images from common bile duct stone extraction.  These images were submitted for radiologic interpretation only. Please see the procedural report for the amount of contrast and the fluoroscopy time utilized.   Electronically Signed   By: Ted Mcalpine M.D.   On: 12/26/2014 13:08     CBC  Recent Labs Lab 12/25/14 1531 12/26/14 0541  WBC 9.3 6.5  HGB 12.9 11.9*  HCT 38.6 35.4*  PLT 319 285  MCV 90.2 90.8  MCH 30.1 30.5  MCHC 33.4 33.6  RDW 14.3 14.4    Chemistries   Recent Labs Lab 12/25/14 1531 12/26/14 0541  NA 135 137  K 4.0 4.5  CL 102 106  CO2 26 25  GLUCOSE 94 99  BUN 11 8  CREATININE 0.83 0.74  CALCIUM 9.3 8.7*  AST 454* 389*  ALT 546* 506*  ALKPHOS 1332* 1208*  BILITOT 15.1* 12.8*   ------------------------------------------------------------------------------------------------------------------ estimated creatinine clearance is 105.4 mL/min (by C-G formula based on Cr of 0.74). ------------------------------------------------------------------------------------------------------------------ No results for input(s): HGBA1C in the last 72  hours. ------------------------------------------------------------------------------------------------------------------  Recent Labs  12/26/14 0541  CHOL 447*  HDL <10*  LDLCALC NOT CALCULATED  TRIG 117  CHOLHDL NOT CALCULATED   ------------------------------------------------------------------------------------------------------------------ No results for input(s): TSH, T4TOTAL, T3FREE, THYROIDAB in the last 72 hours.  Invalid input(s): FREET3 ------------------------------------------------------------------------------------------------------------------ No results for input(s): VITAMINB12, FOLATE, FERRITIN, TIBC, IRON, RETICCTPCT in the last 72 hours.  Coagulation profile  Recent Labs Lab 12/25/14 2035  INR 1.16    No results for input(s): DDIMER in the last 72 hours.  Cardiac Enzymes No results for input(s): CKMB, TROPONINI, MYOGLOBIN in the last 168 hours.  Invalid input(s): CK ------------------------------------------------------------------------------------------------------------------ Invalid input(s): POCBNP     Time Spent in minutes   30 Gracy Racer, DAWOOD M.D on 12/26/2014 at 3:55 PM  Between 7am to 7pm - Pager - [phone]  After 7pm go to [email or web address] - password [facility]  [facility]   Office  [phone]

## 2014-12-26 NOTE — Op Note (Signed)
[facility] [address] [place], [address]   ERCP PROCEDURE REPORT        EXAM DATE: January 09, 2015  PATIENT NAME:          Peggy [name], Peggy [name]          MR #: [id number] BIRTHDATE:       1986-02-25     VISIT #:     [phone] ATTENDING:     Dorena Cookey, MD     STATUS:     inpatient ASSISTANT:      Priscella Mann and Shoffner, Davida   INDICATIONS:  The patient is a 29 yr old female here for an ERCP due to PROCEDURE PERFORMED: MEDICATIONS:  CONSENT: The patient understands the risks and benefits of the procedure and understands that these risks include, but are not limited to: sedation, allergic reaction, infection, perforation and/or bleeding. Alternative means of evaluation and treatment include, among others: physical exam, x-rays, and/or surgical intervention. The patient elects to proceed with this endoscopic procedure.  DESCRIPTION OF PROCEDURE: During intra-op preparation period all mechanical & medical equipment was checked for proper function. Hand hygiene and appropriate measures for infection prevention was taken. After the risks, benefits and alternatives of the procedure were thoroughly explained, Informed was verified, confirmed and timeout was successfully executed by the treatment team. With the patient in left semi-prone position, medications were administered intravenously.The Pentax ERCP [id number] was passed from the mouth into the esophagus and further advanced from the esophagus into the stomach. From stomach scope was directed to the papilla of Vater     .  Major papilla was aligned with the duodenoscope. The scope position was confirmed fluoroscopically. Rest of the findings/therapeutics are given below. The scope was then completely withdrawn from the patient and the procedure completed. The pulse, BP, and O2 saturation were monitored and documented by the physician and the nursing staff throughout the entire procedure.  The patient was cared for as planned according to standard protocol. The patient was then discharged to recovery in stable condition and with appropriate post procedure care. Estimated blood loss is zero unless otherwise noted in this procedure report.  the papilla was very large and appeared to be distorted by distal stone. Cannulation with the sphincterotome and guidewire eventually led to deep cannulation of the common bile duct. The pancreatic duct was not entered or injected. A large sphincterotomy was performed.initially this led to some brisk bleeding but it eventually ceased. Shortly thereafter a large 1.2 cm stone exited from the papilla.there appeared to be other more proximal stones on initial cholangiogram which also showed diffusely dilated duct with dilated intrahepatic ducts. The 15-18 m balloon catheter was exchanged over the guidewire and inflated in the proximal hepatic duct with delivery of at least 5 more faceted stones.  there was excellent drainage of bile at the end of the procedure and appeared to be no further bleeding.    ADVERSE EVENT:     none immediate IMPRESSIONS:     multiple common bile duct stones  RECOMMENDATIONS:     observe overnight for complications and check liver function tests in the morning. REPEAT EXAM:   ___________________________________ Dorena Cookey, MD eSigned:  Dorena Cookey, MD 01/09/15 12:12 PM   cc:  CPT CODES: ICD9 CODES:  The ICD and CPT codes recommended by this software are interpretations from the data that the clinical staff has captured with the software.  The verification of the translation of this report to the ICD  and CPT codes and modifiers is the sole responsibility of the health care institution and practicing physician where this report was generated.  [employer] will not be held responsible for the validity of the ICD and CPT codes included on this report.  AMA assumes no liability  for data contained or not contained herein. CPT is a Publishing rights manager of the [employer].   PATIENT NAME:  Peggy [name], Peggy [name] MR#: [id number]

## 2014-12-26 NOTE — Progress Notes (Signed)
Utilization Review Completed.Ennis Delpozo T9/18/2016  

## 2014-12-26 NOTE — Anesthesia Procedure Notes (Signed)
Performed by: Isidore Moos A Pre-anesthesia Checklist: Patient identified, Emergency Drugs available, Suction available and Patient being monitored Patient Re-evaluated:Patient Re-evaluated prior to inductionOxygen Delivery Method: Circle system utilized Preoxygenation: Pre-oxygenation with 100% oxygen Intubation Type: IV induction Ventilation: Mask ventilation without difficulty Laryngoscope Size: Miller and 2 Grade View: Grade I Tube type: Oral Tube size: 7.5 mm Number of attempts: 1 Airway Equipment and Method: Stylet Placement Confirmation: ETT inserted through vocal cords under direct vision,  positive ETCO2 and breath sounds checked- equal and bilateral Secured at: 23 cm Tube secured with: Tape Dental Injury: Teeth and Oropharynx as per pre-operative assessment

## 2014-12-26 NOTE — Consult Note (Signed)
Reason for Consult: Choledocholithiasis, status post ERCP  Referring Physician: Dr. Evlyn Courier is an 29 y.o. female.  HPI: Peggy [name] developed jaundice and dark colored urine associated with epigastric pain. She was admitted with choledocholithiasis. She underwent ERCP today by Dr. Madilyn Fireman with removal of multiple common bile duct stones. We are asked to consult for consideration of cholecystectomy this admission. She feels pretty well after the procedure. She denies abdominal pain at this time. 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 She has been on anticoagulation since that time. Here, heparin has been held since her ERCP.  Past Medical History  Diagnosis Date  . LGSIL (low grade squamous intraepithelial dysplasia) 12-07 &3-08    C&B 3-08/CONDYLOMA TREATED 06-19-06  . Complete miscarriage 09-12-06  . ADD (attention deficit disorder)   . Factor V deficiency   . Pulmonary embolus     age 55  . Ovarian cyst     Past Surgical History  Procedure Laterality Date  . Incise and drain abcess  11-16-09    LEFT LABIA  . Augmentation mammaplasty  08/2009  . Tonsillectomy    . Wisdom tooth extraction      Family History  Problem Relation Age of Onset  . Cancer Maternal Grandmother     skin  . Diabetes Maternal Grandmother   . Hypertension Maternal Grandfather   . Breast cancer Paternal Grandmother   . Cancer Paternal Grandfather     ? colon    Social History:  reports that she has been smoking.  She has never used smokeless tobacco. She reports that she drinks alcohol. She reports that she does not use illicit drugs.  Allergies:  Allergies  Allergen Reactions  . Caffeine Anaphylaxis  . Peanut Butter Flavor Anaphylaxis    Medications:  Scheduled: . [START ON 12/27/2014] Influenza vac split quadrivalent PF  0.5 mL Intramuscular Tomorrow-1000  . promethazine  25 mg Intravenous Once  . thiamine  100 mg Oral Daily   Or  .  thiamine  100 mg Intravenous Daily   Continuous: . dextrose 5 % and 0.9% NaCl 75 mL/hr at 12/26/14 1301  . [START ON 12/27/2014] heparin     LTL:ZPRARGFP (SUBLIMAZE) injection, LORazepam **OR** LORazepam, promethazine  Results for orders placed or performed during the hospital encounter of 12/25/14 (from the past 48 hour(s))  Acetaminophen level     Status: Abnormal   Collection Time: 12/25/14  3:09 PM  Result Value Ref Range   Acetaminophen (Tylenol), Serum <10 (L) 10 - 30 ug/mL    Comment:        THERAPEUTIC CONCENTRATIONS VARY SIGNIFICANTLY. A RANGE OF 10-30 ug/mL MAY BE AN EFFECTIVE CONCENTRATION FOR MANY PATIENTS. HOWEVER, SOME ARE BEST TREATED AT CONCENTRATIONS OUTSIDE THIS RANGE. ACETAMINOPHEN CONCENTRATIONS >150 ug/mL AT 4 HOURS AFTER INGESTION AND >50 ug/mL AT 12 HOURS AFTER INGESTION ARE OFTEN ASSOCIATED WITH TOXIC REACTIONS.   Ethanol     Status: None   Collection Time: 12/25/14  3:09 PM  Result Value Ref Range   Alcohol, Ethyl (B) <5 <5 mg/dL    Comment:        LOWEST DETECTABLE LIMIT FOR SERUM ALCOHOL IS 5 mg/dL FOR MEDICAL PURPOSES ONLY   I-Stat beta hCG blood, ED ([facility], [facility], [facility] only)     Status: None   Collection Time: 12/25/14  3:22 PM  Result Value Ref Range   I-stat hCG, quantitative <5.0 <5 mIU/mL   Comment 3  Comment:   GEST. AGE      CONC.  (mIU/mL)   <=1 WEEK        5 - 50     2 WEEKS       50 - 500     3 WEEKS       100 - 10,000     4 WEEKS     1,000 - 30,000        FEMALE AND NON-PREGNANT FEMALE:     LESS THAN 5 mIU/mL   Comprehensive metabolic panel     Status: Abnormal   Collection Time: 12/25/14  3:31 PM  Result Value Ref Range   Sodium 135 135 - 145 mmol/L   Potassium 4.0 3.5 - 5.1 mmol/L   Chloride 102 101 - 111 mmol/L   CO2 26 22 - 32 mmol/L   Glucose, Bld 94 65 - 99 mg/dL   BUN 11 6 - 20 mg/dL   Creatinine, Ser 0.83 0.44 - 1.00 mg/dL   Calcium 9.3 8.9 - 10.3 mg/dL   Total Protein 6.6 6.5 - 8.1 g/dL   Albumin 3.0 (L) 3.5  - 5.0 g/dL   AST 454 (H) 15 - 41 U/L   ALT 546 (H) 14 - 54 U/L   Alkaline Phosphatase 1332 (H) 38 - 126 U/L   Total Bilirubin 15.1 (H) 0.3 - 1.2 mg/dL   GFR calc non Af Amer >60 >60 mL/min   GFR calc Af Amer >60 >60 mL/min    Comment: (NOTE) The eGFR has been calculated using the CKD EPI equation. This calculation has not been validated in all clinical situations. eGFR's persistently <60 mL/min signify possible Chronic Kidney Disease.    Anion gap 7 5 - 15  CBC     Status: None   Collection Time: 12/25/14  3:31 PM  Result Value Ref Range   WBC 9.3 4.0 - 10.5 K/uL   RBC 4.28 3.87 - 5.11 MIL/uL   Hemoglobin 12.9 12.0 - 15.0 g/dL   HCT 38.6 36.0 - 46.0 %   MCV 90.2 78.0 - 100.0 fL   MCH 30.1 26.0 - 34.0 pg   MCHC 33.4 30.0 - 36.0 g/dL   RDW 14.3 11.5 - 15.5 %   Platelets 319 150 - 400 K/uL  Urinalysis, Routine w reflex microscopic (not at [facility])     Status: Abnormal   Collection Time: 12/25/14  5:52 PM  Result Value Ref Range   Color, Urine ORANGE (A) YELLOW    Comment: BIOCHEMICALS MAY BE AFFECTED BY COLOR   APPearance CLEAR CLEAR   Specific Gravity, Urine 1.022 1.005 - 1.030   pH 6.0 5.0 - 8.0   Glucose, UA NEGATIVE NEGATIVE mg/dL   Hgb urine dipstick NEGATIVE NEGATIVE   Bilirubin Urine LARGE (A) NEGATIVE   Ketones, ur 15 (A) NEGATIVE mg/dL   Protein, ur NEGATIVE NEGATIVE mg/dL   Urobilinogen, UA 0.2 0.0 - 1.0 mg/dL   Nitrite NEGATIVE NEGATIVE   Leukocytes, UA SMALL (A) NEGATIVE  Urine microscopic-add on     Status: Abnormal   Collection Time: 12/25/14  5:52 PM  Result Value Ref Range   Squamous Epithelial / LPF FEW (A) RARE   WBC, UA 3-6 <3 WBC/hpf   Urine-Other MUCOUS PRESENT   Pregnancy, urine     Status: None   Collection Time: 12/25/14  5:52 PM  Result Value Ref Range   Preg Test, Ur NEGATIVE NEGATIVE    Comment:        THE  SENSITIVITY OF THIS METHODOLOGY IS >20 mIU/mL.   Lipase, blood     Status: Abnormal   Collection Time: 12/25/14  5:55 PM  Result Value  Ref Range   Lipase 282 (H) 22 - 51 U/L  Hepatitis panel, acute     Status: None   Collection Time: 12/25/14  5:55 PM  Result Value Ref Range   Hepatitis B Surface Ag Negative Negative   HCV Ab <0.1 0.0 - 0.9 s/co ratio    Comment: (NOTE)                                  Negative:     < 0.8                             Indeterminate: 0.8 - 0.9                                  Positive:     > 0.9 The CDC recommends that a positive HCV antibody result be followed up with a HCV Nucleic Acid Amplification test ([id number]). Performed At: [facility] [place], [place] [id number] Lindon Romp MD [id number]    Hep A IgM Negative Negative   Hep B C IgM Negative Negative  I-Stat CG4 Lactic Acid, ED     Status: None   Collection Time: 12/25/14  5:55 PM  Result Value Ref Range   Lactic Acid, Venous 0.77 0.5 - 2.0 mmol/L  Urine rapid drug screen (hosp performed)     Status: None   Collection Time: 12/25/14  6:35 PM  Result Value Ref Range   Opiates NONE DETECTED NONE DETECTED   Cocaine NONE DETECTED NONE DETECTED   Benzodiazepines NONE DETECTED NONE DETECTED   Amphetamines NONE DETECTED NONE DETECTED   Tetrahydrocannabinol NONE DETECTED NONE DETECTED   Barbiturates NONE DETECTED NONE DETECTED    Comment:        DRUG SCREEN FOR MEDICAL PURPOSES ONLY.  IF CONFIRMATION IS NEEDED FOR ANY PURPOSE, NOTIFY LAB WITHIN 5 DAYS.        LOWEST DETECTABLE LIMITS FOR URINE DRUG SCREEN Drug Class       Cutoff (ng/mL) Amphetamine      1000 Barbiturate      200 Benzodiazepine   010 Tricyclics       272 Opiates          300 Cocaine          300 THC              50   Protime-INR     Status: None   Collection Time: 12/25/14  8:35 PM  Result Value Ref Range   Prothrombin Time 15.0 11.6 - 15.2 seconds   INR 1.16 0.00 - 1.49  APTT     Status: None   Collection Time: 12/25/14  8:35 PM  Result Value Ref Range   aPTT 29 24 - 37 seconds  I-Stat CG4 Lactic Acid, ED      Status: None   Collection Time: 12/25/14  8:36 PM  Result Value Ref Range   Lactic Acid, Venous 0.50 0.5 - 2.0 mmol/L  Lactic acid, plasma     Status: None   Collection Time: 12/26/14  5:41 AM  Result Value Ref Range   Lactic Acid,  Venous 0.7 0.5 - 2.0 mmol/L  Comprehensive metabolic panel     Status: Abnormal   Collection Time: 12/26/14  5:41 AM  Result Value Ref Range   Sodium 137 135 - 145 mmol/L   Potassium 4.5 3.5 - 5.1 mmol/L   Chloride 106 101 - 111 mmol/L   CO2 25 22 - 32 mmol/L   Glucose, Bld 99 65 - 99 mg/dL   BUN 8 6 - 20 mg/dL   Creatinine, Ser 0.74 0.44 - 1.00 mg/dL   Calcium 8.7 (L) 8.9 - 10.3 mg/dL   Total Protein 5.7 (L) 6.5 - 8.1 g/dL   Albumin 2.6 (L) 3.5 - 5.0 g/dL   AST 389 (H) 15 - 41 U/L   ALT 506 (H) 14 - 54 U/L   Alkaline Phosphatase 1208 (H) 38 - 126 U/L   Total Bilirubin 12.8 (H) 0.3 - 1.2 mg/dL   GFR calc non Af Amer >60 >60 mL/min   GFR calc Af Amer >60 >60 mL/min    Comment: (NOTE) The eGFR has been calculated using the CKD EPI equation. This calculation has not been validated in all clinical situations. eGFR's persistently <60 mL/min signify possible Chronic Kidney Disease. CORRECTED ON 09/18 AT 0731: PREVIOUSLY REPORTED AS >60    Anion gap 6 5 - 15  CBC     Status: Abnormal   Collection Time: 12/26/14  5:41 AM  Result Value Ref Range   WBC 6.5 4.0 - 10.5 K/uL   RBC 3.90 3.87 - 5.11 MIL/uL   Hemoglobin 11.9 (L) 12.0 - 15.0 g/dL   HCT 35.4 (L) 36.0 - 46.0 %   MCV 90.8 78.0 - 100.0 fL   MCH 30.5 26.0 - 34.0 pg   MCHC 33.6 30.0 - 36.0 g/dL   RDW 14.4 11.5 - 15.5 %   Platelets 285 150 - 400 K/uL  Lipid panel     Status: Abnormal   Collection Time: 12/26/14  5:41 AM  Result Value Ref Range   Cholesterol 447 (H) 0 - 200 mg/dL   Triglycerides 117 <150 mg/dL   HDL <10 (L) >40 mg/dL   Total CHOL/HDL Ratio NOT CALCULATED RATIO   VLDL 23 0 - 40 mg/dL   LDL Cholesterol NOT CALCULATED 0 - 99 mg/dL  Surgical pcr screen     Status: None    Collection Time: 12/26/14  8:57 AM  Result Value Ref Range   MRSA, PCR NEGATIVE NEGATIVE   Staphylococcus aureus NEGATIVE NEGATIVE    Comment:        The Xpert SA Assay (FDA approved for NASAL specimens in patients over 66 years of age), is one component of a comprehensive surveillance program.  Test performance has been validated by [facility] for patients greater than or equal to 80 year old. It is not intended to diagnose infection nor to guide or monitor treatment.   Glucose, capillary     Status: None   Collection Time: 12/26/14 12:53 PM  Result Value Ref Range   Glucose-Capillary 72 65 - 99 mg/dL    Ct Abdomen Pelvis W Contrast  12/25/2014   CLINICAL DATA:  29 yr old female with LUQ pain, extreme nausea, vomiting, and worsening jaundice. Patient states she recently was diagnosised with Liver disease but not told what or given any treatement.  EXAM: CT ABDOMEN AND PELVIS WITH CONTRAST  TECHNIQUE: Multidetector CT imaging of the abdomen and pelvis was performed using the standard protocol following bolus administration of intravenous contrast.  CONTRAST:  162mL OMNIPAQUE IOHEXOL 300 MG/ML  SOLN  COMPARISON:  None.  FINDINGS: Lung bases:  Clear.  Heart size.  Gallbladder and biliary tree: There is a peripherally calcified at 12 mm stone in the distal common bile duct. This leads to intra and extrahepatic bile duct dilation. Common bile duct measures 11 mm in the pancreatic head and 14 mm in the porta hepatis. There is diffuse gallbladder wall thickening. No stone is seen within the gallbladder. There is also dilation of the pancreatic duct measuring 5 mm.  Liver: Normal in size and morphology. No mass or focal lesion. Intrahepatic duct dilation as described above.  Spleen: Unremarkable.  Pancreas: No mass or inflammatory changes. Duct dilation as detailed above.  Adrenal glands: No masses.  Kidneys, ureters, bladder:  Normal.  Uterus and adnexa: Uterus unremarkable. Apparent 2 cm  paraovarian cyst. Ovaries unremarkable. Trace pelvic free fluid.  Lymph nodes:  No adenopathy.  Gastrointestinal: Unremarkable. Appendix not visualized. No evidence of appendicitis.  Musculoskeletal:  Unremarkable.  IMPRESSION: 1. 12 mm stone lies in the distal common bile duct causing significant intern extrahepatic bile duct dilation as well as diffuse gallbladder wall thickening/edema. In addition, stone leads to milder dilation of the pancreatic duct. 2. No radiopaque stones are seen within the gallbladder. 3. No other acute finding.   Electronically Signed   By: Lajean Manes M.D.   On: 12/25/2014 19:30   Dg Ercp Biliary & Pancreatic Ducts  12/26/2014   CLINICAL DATA:  Fluoroscopic images from ERCP.  EXAM: ERCP performed by Dr. Geralynn Rile.  TECHNIQUE: Multiple spot images obtained with the fluoroscopic device and submitted for interpretation post-procedure.  FLUOROSCOPY TIME:  Number of Acquired Images:  3  COMPARISON:  None.  FINDINGS: Three fluoroscopic images from ERCP demonstrate endoscope overlying the second portion of the duodenum. Contrast opacifies dilated common bile duct. 3 radiklucent filling defects are seen, probably representing stones within the proximal extrahepatic common bile duct.  IMPRESSION: Intra procedural fluoroscopic images from common bile duct stone extraction.  These images were submitted for radiologic interpretation only. Please see the procedural report for the amount of contrast and the fluoroscopy time utilized.   Electronically Signed   By: Fidela Salisbury M.D.   On: 12/26/2014 13:08    Review of Systems  Constitutional: Positive for malaise/fatigue. Negative for fever.  HENT: Negative.   Eyes: Negative.   Respiratory: Negative for cough.   Cardiovascular: Negative for chest pain.  Gastrointestinal: Positive for nausea and abdominal pain.       Epigastric pain and nausea are improved at this time  Genitourinary: Negative.   Musculoskeletal: Negative.    Skin: Negative.   Neurological: Negative.   Endo/Heme/Allergies:       Factor V Leiden deficiency diagnosed at age 31  Psychiatric/Behavioral: Negative.    Blood pressure 122/79, pulse 55, temperature 98 F (36.7 C), temperature source Oral, resp. rate 16, height $RemoveBe'5\' 10"'[id number]$  (1.778 m), weight 63.776 kg (140 lb 9.6 oz), last menstrual period 12/06/2014, SpO2 100 %, unknown if currently breastfeeding. Physical Exam  Constitutional: She is oriented to person, place, and time. She appears well-developed and well-nourished. No distress.  HENT:  Head: Normocephalic and atraumatic.  Right Ear: External ear normal.  Left Ear: External ear normal.  Nose: Nose normal.  Mouth/Throat: Oropharynx is clear and moist.  Eyes: EOM are normal. Pupils are equal, round, and reactive to light. Scleral icterus is present.  Neck: Neck supple. No tracheal deviation present.  Cardiovascular: Normal rate, normal  heart sounds and intact distal pulses.   Respiratory: Effort normal and breath sounds normal. No stridor. No respiratory distress. She has no wheezes. She has no rales.  GI: Soft. She exhibits no distension. There is tenderness. There is no rebound and no guarding.  Mild lateral right upper quadrant tenderness, no generalized tenderness, no peritonitis  Musculoskeletal: Normal range of motion.  Neurological: She is alert and oriented to person, place, and time. She exhibits normal muscle tone.  Skin: Skin is warm.  Psychiatric: She has a normal mood and affect.    Assessment/Plan: Choledocholithiasis now status post ERCP today - agree with cholecystectomy this admission. We'll plan laparoscopic cholecystectomy, possible cardiogram tomorrow with Dr. Redmond Pulling as the schedule permits. Procedure, risks, and benefits were discussed with her. NPO after midnight. Agree with holding heparin. Dr. Redmond Pulling will discuss things further with her in the morning.  THOMPSON,BURKE E 12/26/2014, 5:16 PM

## 2014-12-26 NOTE — Interval H&P Note (Signed)
History and Physical Interval Note:  12/26/2014 10:56 AM  Peggy [name]  has presented today for surgery, with the diagnosis of biliary dyskinesia  The various methods of treatment have been discussed with the patient and family. After consideration of risks, benefits and other options for treatment, the patient has consented to  Procedure(s): ENDOSCOPIC RETROGRADE CHOLANGIOPANCREATOGRAPHY (ERCP) (N/A) as a surgical intervention .  The patient's history has been reviewed, patient examined, no change in status, stable for surgery.  I have reviewed the patient's chart and labs.  Questions were answered to the patient's satisfaction.     HAYES,JOHN C

## 2014-12-26 NOTE — Progress Notes (Signed)
12/26/2014 12:50 PM  Patient returned from PACU alert and oriented. Patient requesting food. Will inform MD concerning a diet change. New orders written. Will continue to assess and monitor the patient.   [employer], RN-BC, [employer] [facility] [address] Phone [phone]

## 2014-12-26 NOTE — Progress Notes (Signed)
The patient's consent was signed and placed in the chart.  She has been NPO.  She did not receive any PRN medications overnight and her CIWA score has been a zero.

## 2014-12-26 NOTE — Transfer of Care (Signed)
Immediate Anesthesia Transfer of Care Note  Patient: Peggy [name]  Procedure(s) Performed: Procedure(s): ENDOSCOPIC RETROGRADE CHOLANGIOPANCREATOGRAPHY (ERCP) (N/A)  Patient Location: PACU  Anesthesia Type:General  Level of Consciousness: awake, alert  and oriented  Airway & Oxygen Therapy: Patient Spontanous Breathing and Patient connected to nasal cannula oxygen  Post-op Assessment: Report given to RN and Post -op Vital signs reviewed and stable  Post vital signs: Reviewed and stable  Last Vitals:  Filed Vitals:   12/26/14 1017  BP: 126/79  Pulse: 58  Temp:   Resp: 10    Complications: No apparent anesthesia complications

## 2014-12-26 NOTE — Consult Note (Signed)
Consultation  Referring Provider:     Hector Shade Primary Care Physician:  Pershing Proud Primary Gastroenterologist:      NA   Reason for Consultation:     choledocholithiasis         HPI:   Peggy [name] is a 29 y.o. female presenting with abdominal pain and jaundice. She reports pain has been present for the past week, epigastric, to RUQ and LUQ. Symptoms have worsened over the past week. Seen in the ED and noted to have 12mm calcified gallstone in the distal CBD. Markedly jaundiced with elevation in AP, ALT, AST. She has never had anything like this previously. She endorses alcohol use on the weekend although review of chart are claims from family that she drinks much more than this. 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 Placed on heparin drip overnight. She has been NPO overnight. Pain slightly improved this AM.    Past Medical History  Diagnosis Date  . LGSIL (low grade squamous intraepithelial dysplasia) 12-07 &3-08    C&B 3-08/CONDYLOMA TREATED 06-19-06  . Complete miscarriage 09-12-06  . ADD (attention deficit disorder)   . Factor V deficiency   . Pulmonary embolus     age 74  . Ovarian cyst     Past Surgical History  Procedure Laterality Date  . Incise and drain abcess  11-16-09    LEFT LABIA  . Augmentation mammaplasty  08/2009  . Tonsillectomy    . Wisdom tooth extraction      Family History  Problem Relation Age of Onset  . Cancer Maternal Grandmother     skin  . Diabetes Maternal Grandmother   . Hypertension Maternal Grandfather   . Breast cancer Paternal Grandmother   . Cancer Paternal Grandfather     ? colon     Social History  Substance Use Topics  . Smoking status: Current Some Day Smoker  . Smokeless tobacco: Never Used  . Alcohol Use: Yes     Comment: once a week    Prior to Admission medications   Medication Sig Start Date End Date Taking? Authorizing Provider  enoxaparin (LOVENOX) 40  MG/0.4ML injection Inject 40 mg into the skin daily.   Yes Historical Provider, MD  ibuprofen (ADVIL,MOTRIN) 200 MG tablet Take 800 mg by mouth every 8 (eight) hours as needed.   Yes Historical Provider, MD  ondansetron (ZOFRAN) 8 MG tablet Take 8 mg by mouth every 8 (eight) hours as needed for nausea or vomiting.   Yes Historical Provider, MD  oxyCODONE-acetaminophen (PERCOCET/ROXICET) 5-325 MG per tablet Take 1 tablet by mouth every 6 (six) hours as needed for severe pain. Patient taking differently: Take 1 tablet by mouth at bedtime.  09/15/14  Yes Shon Baton, MD  promethazine (PHENERGAN) 25 MG tablet Take 25 mg by mouth every evening.   Yes Historical Provider, MD  traMADol (ULTRAM) 50 MG tablet Take 50 mg by mouth every 12 (twelve) hours as needed for moderate pain.   Yes Historical Provider, MD    Current Facility-Administered Medications  Medication Dose Route Frequency Provider Last Rate Last Dose  . dextrose 5 %-0.9 % sodium chloride infusion   Intravenous Continuous Albertine Grates, MD 75 mL/hr at 12/25/14 2325    . LORazepam (ATIVAN) tablet 1 mg  1 mg Oral Q6H PRN Albertine Grates, MD       Or  . LORazepam (ATIVAN) injection 1 mg  1  mg Intravenous Q6H PRN Albertine Grates, MD      . promethazine (PHENERGAN) injection 25 mg  25 mg Intravenous Once Eber Hong, MD   25 mg at 12/25/14 1755  . thiamine (VITAMIN B-1) tablet 100 mg  100 mg Oral Daily Albertine Grates, MD   100 mg at 12/25/14 2243   Or  . thiamine (B-1) injection 100 mg  100 mg Intravenous Daily Albertine Grates, MD        Allergies as of 12/25/2014 - Review Complete 12/25/2014  Allergen Reaction Noted  . Caffeine Anaphylaxis 09/11/2011  . Peanut butter flavor Anaphylaxis 07/21/2013     Review of Systems:    As per HPI, otherwise negative    Physical Exam:  Vital signs in last 24 hours: Temp:  [98.2 F (36.8 C)-98.9 F (37.2 C)] 98.2 F (36.8 C) (09/18 0556) Pulse Rate:  [53-108] 60 (09/18 0556) Resp:  [16-18] 18 (09/18 0556) BP:  (98-121)/(58-86) 110/73 mmHg (09/18 0556) SpO2:  [98 %-100 %] 99 % (09/18 0556) Weight:  [137 lb 14.4 oz (62.551 kg)-140 lb 9.6 oz (63.776 kg)] 140 lb 9.6 oz (63.776 kg) (09/17 2150) Last BM Date: 12/24/14 General:   Pleasant white female in NAD Head:  Normocephalic and atraumatic. Eyes:   scleral icterus.   Conjunctiva pink. Ears:  Normal auditory acuity. Neck:  Supple Lungs:  Respirations even and unlabored. Lungs clear to auscultation bilaterally.   No wheezes, crackles, or rhonchi.  Heart:  Regular rate and rhythm; no MRG Abdomen:  Soft, nondistended, mild RUQ and epigastric TTP without rebound or guarding. Normal bowel sounds. No appreciable masses or hepatomegaly.  Rectal:  Not performed.  Msk:  Symmetrical without gross deformities.  Extremities:  Without edema. Neurologic:  Alert and  oriented x4;  grossly normal neurologically. Skin:  Intact without significant lesions or rashes. Psych:  Alert and cooperative. Normal affect.  LAB RESULTS:  Recent Labs  12/25/14 1531 12/26/14 0541  WBC 9.3 6.5  HGB 12.9 11.9*  HCT 38.6 35.4*  PLT 319 285   BMET  Recent Labs  12/25/14 1531 12/26/14 0541  NA 135 137  K 4.0 4.5  CL 102 106  CO2 26 25  GLUCOSE 94 99  BUN 11 8  CREATININE 0.83 0.74  CALCIUM 9.3 8.7*   LFT  Recent Labs  12/26/14 0541  PROT 5.7*  ALBUMIN 2.6*  AST 389*  ALT 506*  ALKPHOS 1208*  BILITOT 12.8*   PT/INR  Recent Labs  12/25/14 2035  LABPROT 15.0  INR 1.16    STUDIES: Ct Abdomen Pelvis W Contrast  12/25/2014   CLINICAL DATA:  29 yr old female with LUQ pain, extreme nausea, vomiting, and worsening jaundice. Patient states she recently was diagnosised with Liver disease but not told what or given any treatement.  EXAM: CT ABDOMEN AND PELVIS WITH CONTRAST  TECHNIQUE: Multidetector CT imaging of the abdomen and pelvis was performed using the standard protocol following bolus administration of intravenous contrast.  CONTRAST:   OMNIPAQUE IOHEXOL 300 MG/ML  SOLN  COMPARISON:  None.  FINDINGS: Lung bases:  Clear.  Heart size.  Gallbladder and biliary tree: There is a peripherally calcified at 12 mm stone in the distal common bile duct. This leads to intra and extrahepatic bile duct dilation. Common bile duct measures 11 mm in the pancreatic head and 14 mm in the porta hepatis. There is diffuse gallbladder wall thickening. No stone is seen within the gallbladder. There is also dilation of the pancreatic  duct measuring 5 mm.  Liver: Normal in size and morphology. No mass or focal lesion. Intrahepatic duct dilation as described above.  Spleen: Unremarkable.  Pancreas: No mass or inflammatory changes. Duct dilation as detailed above.  Adrenal glands: No masses.  Kidneys, ureters, bladder:  Normal.  Uterus and adnexa: Uterus unremarkable. Apparent 2 cm paraovarian cyst. Ovaries unremarkable. Trace pelvic free fluid.  Lymph nodes:  No adenopathy.  Gastrointestinal: Unremarkable. Appendix not visualized. No evidence of appendicitis.  Musculoskeletal:  Unremarkable.  IMPRESSION: 1. 12 mm stone lies in the distal common bile duct causing significant intern extrahepatic bile duct dilation as well as diffuse gallbladder wall thickening/edema. In addition, stone leads to milder dilation of the pancreatic duct. 2. No radiopaque stones are seen within the gallbladder. 3. No other acute finding.   Electronically Signed   By: Amie Portland M.D.   On: 12/25/2014 19:30     PREVIOUS ENDOSCOPIES:            NA   Impression / Plan:  29 y/o female admitted with abdominal pain and jaundice, found to have choledocholithiasis on CT imaging. 12mm calcified gallstone in the CBD. Hyperbilirubinemia noted with elevated lipase but no CT evidence of pancreatitis but there is dilation of the PD. No leukocytosis and the patient is afebrile. Discussed choledocholithiasis with the patient and treatment. Recommend ERCP for stone extraction, she is scheduled for roughly  10AM today. Heparin drip will be stopped. Dr. Madilyn Fireman will be performing the procedure. I discussed risks / benefits of ERCP with the patient to include pancreatitis, bleeding, perforation, etc, and she verbalized understanding and wished to proceed. I would otherwise recommend general surgery consult for cholecystectomy given her high risk of recurrence without cholecystectomy, would anticipate this would be done while she is inpatient. Agree with CIWA scale given EtOH history. Further recommendations pending ERCP.  Please call with questions / concerns. Thanks  Ileene Patrick, MD [facility] Pager [phone]     LOS: 1 day   Reeves Forth Najee Manninen  12/26/2014, 6:57 AM

## 2014-12-26 NOTE — Progress Notes (Signed)
ANTICOAGULATION CONSULT NOTE - Initial Consult  Pharmacy Consult for Heparin Indication: h/o PE  Patient Measurements: Height:  (177.8 cm) Weight: 140 lb 9.6 oz (63.776 kg) IBW/kg (Calculated) : 68.5 Heparin Dosing Weight: 64 kg  Vital Signs: Temp: 98 F (36.7 C) (09/18 1250) Temp Source: Oral (09/18 1017) BP: 122/79 mmHg (09/18 1250) Pulse Rate: 55 (09/18 1250)  Labs:  Recent Labs  12/25/14 1531 12/25/14 2035 12/26/14 0541  HGB 12.9  --  11.9*  HCT 38.6  --  35.4*  PLT 319  --  285  APTT  --  29  --   LABPROT  --  15.0  --   INR  --  1.16  --   CREATININE 0.83  --  0.74    Estimated Creatinine Clearance: 105.4 mL/min (by C-G formula based on Cr of 0.74).   Medical History: Past Medical History  Diagnosis Date  . LGSIL (low grade squamous intraepithelial dysplasia) 12-07 &3-08    C&B 3-08/CONDYLOMA TREATED 06-19-06  . Complete miscarriage 09-12-06  . ADD (attention deficit disorder)   . Factor V deficiency   . Pulmonary embolus     age 36  . Ovarian cyst     Medications:  Prescriptions prior to admission  Medication Sig Dispense Refill Last Dose  . enoxaparin (LOVENOX) 40 MG/0.4ML injection Inject 40 mg into the skin daily.   12/25/2014 at 730  . ibuprofen (ADVIL,MOTRIN) 200 MG tablet Take 800 mg by mouth every 8 (eight) hours as needed.   Past Week at Unknown time  . ondansetron (ZOFRAN) 8 MG tablet Take 8 mg by mouth every 8 (eight) hours as needed for nausea or vomiting.   12/24/2014 at Unknown time  . oxyCODONE-acetaminophen (PERCOCET/ROXICET) 5-325 MG per tablet Take 1 tablet by mouth every 6 (six) hours as needed for severe pain. (Patient taking differently: Take 1 tablet by mouth at bedtime. ) 10 tablet 0 12/24/2014 at Unknown time  . promethazine (PHENERGAN) 25 MG tablet Take 25 mg by mouth every evening.   12/24/2014 at Unknown time  . traMADol (ULTRAM) 50 MG tablet Take 50 mg by mouth every 12 (twelve) hours as needed for moderate pain.   12/25/2014  at Unknown time   Scheduled:  . [START ON 12/27/2014] Influenza vac split quadrivalent PF  0.5 mL Intramuscular Tomorrow-1000  . promethazine  25 mg Intravenous Once  . thiamine  100 mg Oral Daily   Or  . thiamine  100 mg Intravenous Daily   Infusions:  . dextrose 5 % and 0.9% NaCl 75 mL/hr at 12/26/14 1301  . [START ON 12/27/2014] heparin      Assessment: 29yo female with history of PE and factor V deficiency presents with N/V and abdominal pain for 1 week. Pharmacy is consulted to dose heparin for history of PE before patient goes for ERCP in the morning. Pt administered lovenox  this morning at 0730. CBC is wnl, sCr 0.83.   S/p ERCP today with gallstone removal.  Discussed timing of resuming heparin with Dr. Florene Route - resume after 24 hours which will be approximately 6am tomorrow. Goal of Therapy:  Heparin level 0.3-0.7 units/ml Monitor platelets by anticoagulation protocol: Yes   Plan:  Resume heparin drip at 6am tomorrow at 1000 units/hr Heparin level in 6 hours Daily CBC and heparin level while on heparin.  Celedonio Miyamoto, PharmD, BCPS-AQ ID Clinical Pharmacist Pager [phone]  12/26/2014,4:16 PM

## 2014-12-26 NOTE — Anesthesia Preprocedure Evaluation (Addendum)
Anesthesia Evaluation   Patient awake    Reviewed: Allergy & Precautions, NPO status , Patient's Chart, lab work & pertinent test results  History of Anesthesia Complications Negative for: history of anesthetic complications  Airway Mallampati: I  TM Distance: >3 FB Neck ROM: Full    Dental  (+) Teeth Intact, Dental Advisory Given   Pulmonary Current Smoker, PE (Hx PE at 16 years)   Pulmonary exam normal breath sounds clear to auscultation       Cardiovascular Exercise Tolerance: Good (-) hypertension(-) angina(-) Past MI Normal cardiovascular exam Rhythm:Regular Rate:Normal     Neuro/Psych negative neurological ROS  negative psych ROS   GI/Hepatic Jaundice, elevated liver enzymes CBD stone for ERCP   Endo/Other  negative endocrine ROS  Renal/GU negative Renal ROS  negative genitourinary   Musculoskeletal negative musculoskeletal ROS (+)   Abdominal   Peds  Hematology  (+) Blood dyscrasia, anemia ,   Anesthesia Other Findings   Reproductive/Obstetrics negative OB ROS                          Anesthesia Physical Anesthesia Plan  ASA: II  Anesthesia Plan: General   Post-op Pain Management:    Induction: Intravenous  Airway Management Planned: Oral ETT  Additional Equipment:   Intra-op Plan:   Post-operative Plan: Extubation in OR  Informed Consent: I have reviewed the patients History and Physical, chart, labs and discussed the procedure including the risks, benefits and alternatives for the proposed anesthesia with the patient or authorized representative who has indicated his/her understanding and acceptance.   Dental advisory given  Plan Discussed with: CRNA  Anesthesia Plan Comments: (Risks/benefits of general anesthesia discussed with patient including risk of damage to teeth, lips, gum, and tongue, nausea/vomiting, allergic reactions to medications, and the  possibility of heart attack, stroke and death.  All patient questions answered.  Patient wishes to proceed.)        Anesthesia Quick Evaluation

## 2014-12-26 NOTE — Progress Notes (Signed)
The patient arrived to 6E26 from the ED at 2200.  She was oriented to the unit.  She is A&Ox4 and her VS are stable.  She is up ad lib and is complaining of a dull tenderness in the upper left quadrant of her abdomen.  Her call bell was explained and placed within reach.

## 2014-12-26 NOTE — Anesthesia Postprocedure Evaluation (Signed)
  Anesthesia Post-op Note  Patient: Peggy [name]  Procedure(s) Performed: Procedure(s) (LRB): ENDOSCOPIC RETROGRADE CHOLANGIOPANCREATOGRAPHY (ERCP) (N/A)  Patient Location: PACU  Anesthesia Type: General  Level of Consciousness: awake and alert   Airway and Oxygen Therapy: Patient Spontanous Breathing  Post-op Pain: mild  Post-op Assessment: Post-op Vital signs reviewed, Patient's Cardiovascular Status Stable, Respiratory Function Stable, Patent Airway and No signs of Nausea or vomiting  Last Vitals:  Filed Vitals:   12/26/14 1215  BP: 114/80  Pulse: 57  Temp: 36.7 C  Resp: 12    Post-op Vital Signs: stable   Complications: No apparent anesthesia complications

## 2014-12-26 NOTE — H&P (View-Only) (Signed)
Utilization Review Completed.Dowell, Deborah T9/18/2016  

## 2014-12-27 ENCOUNTER — Inpatient Hospital Stay (HOSPITAL_COMMUNITY): Payer: Medicaid Other | Admitting: Anesthesiology

## 2014-12-27 ENCOUNTER — Encounter (HOSPITAL_COMMUNITY): Payer: Self-pay | Admitting: Gastroenterology

## 2014-12-27 ENCOUNTER — Encounter (HOSPITAL_COMMUNITY)
Admission: EM | Disposition: A | Discharge status: Home / Self Care | Payer: Self-pay | Source: Home / Self Care | Attending: Internal Medicine

## 2014-12-27 DIAGNOSIS — R7989 Other specified abnormal findings of blood chemistry: Secondary | ICD-10-CM

## 2014-12-27 DIAGNOSIS — R945 Abnormal results of liver function studies: Secondary | ICD-10-CM

## 2014-12-27 HISTORY — PX: CHOLECYSTECTOMY: SHX55

## 2014-12-27 LAB — COMPREHENSIVE METABOLIC PANEL
ALBUMIN: 2.6 g/dL — AB (ref 3.5–5.0)
ALK PHOS: 1063 U/L — AB (ref 38–126)
ALT: 413 U/L — AB (ref 14–54)
AST: 223 U/L — AB (ref 15–41)
Anion gap: 7 (ref 5–15)
BILIRUBIN TOTAL: 6.2 mg/dL — AB (ref 0.3–1.2)
BUN: 8 mg/dL (ref 6–20)
CALCIUM: 8.8 mg/dL — AB (ref 8.9–10.3)
CO2: 27 mmol/L (ref 22–32)
Chloride: 104 mmol/L (ref 101–111)
Creatinine, Ser: 0.7 mg/dL (ref 0.44–1.00)
GFR calc Af Amer: >60 mL/min (ref >60)
GFR calc non Af Amer: >60 mL/min (ref >60)
GLUCOSE: 99 mg/dL (ref 65–99)
Potassium: 4.1 mmol/L (ref 3.5–5.1)
Sodium: 138 mmol/L (ref 135–145)
TOTAL PROTEIN: 5.4 g/dL — AB (ref 6.5–8.1)

## 2014-12-27 LAB — LIPASE, BLOOD: LIPASE: 58 U/L — AB (ref 22–51)

## 2014-12-27 LAB — CBC
HCT: 32.2 % — ABNORMAL LOW (ref 36.0–46.0)
Hemoglobin: 10.6 g/dL — ABNORMAL LOW (ref 12.0–15.0)
MCH: 29.9 pg (ref 26.0–34.0)
MCHC: 32.9 g/dL (ref 30.0–36.0)
MCV: 90.7 fL (ref 78.0–100.0)
Platelets: 303 10*3/uL (ref 150–400)
RBC: 3.55 MIL/uL — ABNORMAL LOW (ref 3.87–5.11)
RDW: 14.5 % (ref 11.5–15.5)
WBC: 7.2 10*3/uL (ref 4.0–10.5)

## 2014-12-27 SURGERY — LAPAROSCOPIC CHOLECYSTECTOMY WITH INTRAOPERATIVE CHOLANGIOGRAM
Anesthesia: General | Site: Abdomen

## 2014-12-27 MED ORDER — MIDAZOLAM HCL 5 MG/5ML IJ SOLN
INTRAMUSCULAR | Status: DC | PRN
  Administered 2014-12-27: 2 mg via INTRAVENOUS

## 2014-12-27 MED ORDER — PANTOPRAZOLE SODIUM 40 MG IV SOLR
40.0000 mg | Freq: Every day | INTRAVENOUS | Status: DC
  Administered 2014-12-27: 40 mg via INTRAVENOUS
  Filled 2014-12-27: qty 40

## 2014-12-27 MED ORDER — FENTANYL CITRATE (PF) 250 MCG/5ML IJ SOLN
INTRAMUSCULAR | Status: AC
  Filled 2014-12-27: qty 5

## 2014-12-27 MED ORDER — FENTANYL CITRATE (PF) 100 MCG/2ML IJ SOLN
INTRAMUSCULAR | Status: DC | PRN
  Administered 2014-12-27 (×2): 100 ug via INTRAVENOUS
  Administered 2014-12-27 (×2): 50 ug via INTRAVENOUS

## 2014-12-27 MED ORDER — PROMETHAZINE HCL 25 MG/ML IJ SOLN
6.2500 mg | INTRAMUSCULAR | Status: DC | PRN
  Administered 2014-12-27: 6.25 mg via INTRAVENOUS

## 2014-12-27 MED ORDER — HYDROMORPHONE HCL 1 MG/ML IJ SOLN
0.2500 mg | INTRAMUSCULAR | Status: DC | PRN
  Administered 2014-12-27 (×6): 0.5 mg via INTRAVENOUS

## 2014-12-27 MED ORDER — SODIUM CHLORIDE 0.9 % IR SOLN
Status: DC | PRN
  Administered 2014-12-27: 1000 mL

## 2014-12-27 MED ORDER — IBUPROFEN 600 MG PO TABS: 600.0000 mg | ORAL_TABLET | Freq: Four times a day (QID) | ORAL | Status: DC | PRN

## 2014-12-27 MED ORDER — NEOSTIGMINE METHYLSULFATE 10 MG/10ML IV SOLN
INTRAVENOUS | Status: AC
  Filled 2014-12-27: qty 1

## 2014-12-27 MED ORDER — HYDROMORPHONE HCL 1 MG/ML IJ SOLN
INTRAMUSCULAR | Status: AC
  Administered 2014-12-27: 0.5 mg via INTRAVENOUS
  Filled 2014-12-27: qty 1

## 2014-12-27 MED ORDER — SUGAMMADEX SODIUM 200 MG/2ML IV SOLN
INTRAVENOUS | Status: AC
  Filled 2014-12-27: qty 2

## 2014-12-27 MED ORDER — ONDANSETRON HCL 4 MG/2ML IJ SOLN
INTRAMUSCULAR | Status: AC
  Filled 2014-12-27: qty 2

## 2014-12-27 MED ORDER — LIDOCAINE HCL (CARDIAC) 20 MG/ML IV SOLN
INTRAVENOUS | Status: AC
  Filled 2014-12-27: qty 5

## 2014-12-27 MED ORDER — KETOROLAC TROMETHAMINE 30 MG/ML IJ SOLN
30.0000 mg | Freq: Once | INTRAMUSCULAR | Status: AC
  Administered 2014-12-27: 30 mg via INTRAVENOUS

## 2014-12-27 MED ORDER — PROPOFOL 10 MG/ML IV BOLUS
INTRAVENOUS | Status: DC | PRN
  Administered 2014-12-27: 180 mg via INTRAVENOUS

## 2014-12-27 MED ORDER — GLYCOPYRROLATE 0.2 MG/ML IJ SOLN
INTRAMUSCULAR | Status: DC | PRN
  Administered 2014-12-27: 0.4 mg via INTRAVENOUS

## 2014-12-27 MED ORDER — OXYCODONE HCL 5 MG PO TABS
5.0000 mg | ORAL_TABLET | ORAL | Status: DC | PRN
  Administered 2014-12-27 – 2014-12-28 (×5): 10 mg via ORAL
  Filled 2014-12-27 (×4): qty 2

## 2014-12-27 MED ORDER — MORPHINE SULFATE (PF) 2 MG/ML IV SOLN: 1.0000 mg | INTRAVENOUS | Status: DC | PRN

## 2014-12-27 MED ORDER — 0.9 % SODIUM CHLORIDE (POUR BTL) OPTIME
TOPICAL | Status: DC | PRN
  Administered 2014-12-27: 1000 mL

## 2014-12-27 MED ORDER — GLYCOPYRROLATE 0.2 MG/ML IJ SOLN
INTRAMUSCULAR | Status: AC
  Filled 2014-12-27: qty 2

## 2014-12-27 MED ORDER — ROCURONIUM BROMIDE 50 MG/5ML IV SOLN
INTRAVENOUS | Status: AC
  Filled 2014-12-27: qty 1

## 2014-12-27 MED ORDER — BUPIVACAINE-EPINEPHRINE (PF) 0.25% -1:200000 IJ SOLN
INTRAMUSCULAR | Status: AC
  Filled 2014-12-27: qty 30

## 2014-12-27 MED ORDER — LACTATED RINGERS IV SOLN
INTRAVENOUS | Status: DC
  Administered 2014-12-27: 09:00:00 via INTRAVENOUS

## 2014-12-27 MED ORDER — PROMETHAZINE HCL 25 MG/ML IJ SOLN
INTRAMUSCULAR | Status: AC
Start: 2014-12-27 — End: 2014-12-27
  Administered 2014-12-27: 6.25 mg via INTRAVENOUS
  Filled 2014-12-27: qty 1

## 2014-12-27 MED ORDER — HYDROMORPHONE HCL 1 MG/ML IJ SOLN
INTRAMUSCULAR | Status: AC
  Administered 2014-12-27: 0.5 mg via INTRAVENOUS
  Filled 2014-12-27: qty 2

## 2014-12-27 MED ORDER — OXYCODONE HCL 5 MG PO TABS
ORAL_TABLET | ORAL | Status: AC
  Administered 2014-12-27: 10 mg via ORAL
  Filled 2014-12-27: qty 2

## 2014-12-27 MED ORDER — PROPOFOL 10 MG/ML IV BOLUS
INTRAVENOUS | Status: AC
  Filled 2014-12-27: qty 20

## 2014-12-27 MED ORDER — LIDOCAINE HCL (CARDIAC) 20 MG/ML IV SOLN
INTRAVENOUS | Status: DC | PRN
  Administered 2014-12-27: 60 mg via INTRAVENOUS

## 2014-12-27 MED ORDER — NEOSTIGMINE METHYLSULFATE 10 MG/10ML IV SOLN
INTRAVENOUS | Status: DC | PRN
  Administered 2014-12-27: 4 mg via INTRAVENOUS

## 2014-12-27 MED ORDER — METOCLOPRAMIDE HCL 5 MG/ML IJ SOLN
INTRAMUSCULAR | Status: DC | PRN
  Administered 2014-12-27: 10 mg via INTRAVENOUS

## 2014-12-27 MED ORDER — KETOROLAC TROMETHAMINE 30 MG/ML IJ SOLN
INTRAMUSCULAR | Status: AC
  Filled 2014-12-27: qty 1

## 2014-12-27 MED ORDER — HEPARIN (PORCINE) IN NACL 100-0.45 UNIT/ML-% IJ SOLN
1200.0000 [IU]/h | INTRAMUSCULAR | Status: DC
  Administered 2014-12-27: 1000 [IU]/h via INTRAVENOUS
  Filled 2014-12-27: qty 250

## 2014-12-27 MED ORDER — DEXAMETHASONE SODIUM PHOSPHATE 10 MG/ML IJ SOLN
INTRAMUSCULAR | Status: DC | PRN
  Administered 2014-12-27: 10 mg via INTRAVENOUS

## 2014-12-27 MED ORDER — BUPIVACAINE-EPINEPHRINE 0.25% -1:200000 IJ SOLN
INTRAMUSCULAR | Status: DC | PRN
  Administered 2014-12-27: 22 mL

## 2014-12-27 MED ORDER — ONDANSETRON HCL 4 MG/2ML IJ SOLN
INTRAMUSCULAR | Status: DC | PRN
  Administered 2014-12-27: 4 mg via INTRAVENOUS

## 2014-12-27 MED ORDER — HEPARIN SODIUM (PORCINE) 5000 UNIT/ML IJ SOLN
5000.0000 [IU] | INTRAMUSCULAR | Status: AC
  Administered 2014-12-27: 5000 [IU] via SUBCUTANEOUS
  Filled 2014-12-27: qty 1

## 2014-12-27 MED ORDER — LACTATED RINGERS IV SOLN
INTRAVENOUS | Status: DC | PRN
  Administered 2014-12-27 (×2): via INTRAVENOUS

## 2014-12-27 MED ORDER — MIDAZOLAM HCL 2 MG/2ML IJ SOLN
INTRAMUSCULAR | Status: AC
  Filled 2014-12-27: qty 4

## 2014-12-27 MED ORDER — ROCURONIUM BROMIDE 100 MG/10ML IV SOLN
INTRAVENOUS | Status: DC | PRN
  Administered 2014-12-27: 10 mg via INTRAVENOUS
  Administered 2014-12-27: 40 mg via INTRAVENOUS
  Administered 2014-12-27: 10 mg via INTRAVENOUS

## 2014-12-27 MED ORDER — SIMETHICONE 80 MG PO CHEW
40.0000 mg | CHEWABLE_TABLET | Freq: Four times a day (QID) | ORAL | Status: DC | PRN
Start: 2014-12-27 — End: 2014-12-30

## 2014-12-27 SURGICAL SUPPLY — 53 items
APPLIER CLIP 5 13 M/L LIGAMAX5 (MISCELLANEOUS) ×3
BANDAGE ADH SHEER 1  50/CT (GAUZE/BANDAGES/DRESSINGS) ×9 IMPLANT
BENZOIN TINCTURE PRP APPL 2/3 (GAUZE/BANDAGES/DRESSINGS) ×3 IMPLANT
BLADE SURG ROTATE 9660 (MISCELLANEOUS) IMPLANT
BNDG COHESIVE 2X5 WHT NS (GAUZE/BANDAGES/DRESSINGS) ×9 IMPLANT
CANISTER SUCTION 2500CC (MISCELLANEOUS) ×3 IMPLANT
CHLORAPREP W/TINT 26ML (MISCELLANEOUS) ×3 IMPLANT
CLIP APPLIE 5 13 M/L LIGAMAX5 (MISCELLANEOUS) ×1 IMPLANT
CLOSURE WOUND 1/2 X4 (GAUZE/BANDAGES/DRESSINGS) ×1
COVER MAYO STAND STRL (DRAPES) ×3 IMPLANT
COVER SURGICAL LIGHT HANDLE (MISCELLANEOUS) ×3 IMPLANT
DRAPE C-ARM 42X72 X-RAY (DRAPES) ×3 IMPLANT
DRSG TEGADERM 2-3/8X2-3/4 SM (GAUZE/BANDAGES/DRESSINGS) ×9 IMPLANT
DRSG TEGADERM 4X4.75 (GAUZE/BANDAGES/DRESSINGS) ×3 IMPLANT
ELECT REM PT RETURN 9FT ADLT (ELECTROSURGICAL) ×3
ELECTRODE REM PT RTRN 9FT ADLT (ELECTROSURGICAL) ×1 IMPLANT
ENDOLOOP SUT PDS II  0 18 (SUTURE) ×2
ENDOLOOP SUT PDS II 0 18 (SUTURE) ×1 IMPLANT
GAUZE SPONGE 2X2 8PLY STRL LF (GAUZE/BANDAGES/DRESSINGS) ×1 IMPLANT
GLOVE BIO SURGEON STRL SZ7 (GLOVE) ×3 IMPLANT
GLOVE BIO SURGEON STRL SZ8.5 (GLOVE) ×3 IMPLANT
GLOVE BIOGEL M STRL SZ7.5 (GLOVE) ×3 IMPLANT
GLOVE BIOGEL PI IND STRL 7.0 (GLOVE) ×1 IMPLANT
GLOVE BIOGEL PI IND STRL 8 (GLOVE) ×2 IMPLANT
GLOVE BIOGEL PI IND STRL 8.5 (GLOVE) ×1 IMPLANT
GLOVE BIOGEL PI INDICATOR 7.0 (GLOVE) ×2
GLOVE BIOGEL PI INDICATOR 8 (GLOVE) ×4
GLOVE BIOGEL PI INDICATOR 8.5 (GLOVE) ×2
GLOVE ECLIPSE 7.0 STRL STRAW (GLOVE) ×3 IMPLANT
GOWN STRL REUS W/ TWL LRG LVL3 (GOWN DISPOSABLE) ×3 IMPLANT
GOWN STRL REUS W/ TWL XL LVL3 (GOWN DISPOSABLE) ×1 IMPLANT
GOWN STRL REUS W/TWL LRG LVL3 (GOWN DISPOSABLE) ×9
GOWN STRL REUS W/TWL XL LVL3 (GOWN DISPOSABLE) ×2
KIT BASIN OR (CUSTOM PROCEDURE TRAY) ×3 IMPLANT
KIT ROOM TURNOVER OR (KITS) ×3 IMPLANT
NS IRRIG 1000ML POUR BTL (IV SOLUTION) ×3 IMPLANT
PAD ARMBOARD 7.5X6 YLW CONV (MISCELLANEOUS) ×3 IMPLANT
POUCH RETRIEVAL ECOSAC 10 (ENDOMECHANICALS) ×1 IMPLANT
POUCH RETRIEVAL ECOSAC 10MM (ENDOMECHANICALS) ×2
SCISSORS LAP 5X35 DISP (ENDOMECHANICALS) ×3 IMPLANT
SET CHOLANGIOGRAPH 5 50 .035 (SET/KITS/TRAYS/PACK) ×3 IMPLANT
SET IRRIG TUBING LAPAROSCOPIC (IRRIGATION / IRRIGATOR) ×3 IMPLANT
SLEEVE ENDOPATH XCEL 5M (ENDOMECHANICALS) ×6 IMPLANT
SPECIMEN JAR SMALL (MISCELLANEOUS) ×3 IMPLANT
SPONGE GAUZE 2X2 STER 10/PKG (GAUZE/BANDAGES/DRESSINGS) ×2
STRIP CLOSURE SKIN 1/2X4 (GAUZE/BANDAGES/DRESSINGS) ×2 IMPLANT
SUT MNCRL AB 4-0 PS2 18 (SUTURE) ×3 IMPLANT
TOWEL OR 17X24 6PK STRL BLUE (TOWEL DISPOSABLE) ×3 IMPLANT
TOWEL OR 17X26 10 PK STRL BLUE (TOWEL DISPOSABLE) ×3 IMPLANT
TRAY LAPAROSCOPIC MC (CUSTOM PROCEDURE TRAY) ×3 IMPLANT
TROCAR XCEL BLUNT TIP 100MML (ENDOMECHANICALS) ×3 IMPLANT
TROCAR XCEL NON-BLD 5MMX100MML (ENDOMECHANICALS) ×3 IMPLANT
TUBING INSUFFLATION (TUBING) ×3 IMPLANT

## 2014-12-27 NOTE — Progress Notes (Signed)
Day of Surgery  Subjective: No n/v/pain yesterday/overnight. Sore throat from ERCP  Objective: Vital signs in last 24 hours: Temp:  [98 F (36.7 C)-100.8 F (38.2 C)] 100.8 F (38.2 C) (09/19 0540) Pulse Rate:  [55-103] 103 (09/19 0540) Resp:  [10-16] 16 (09/19 0540) BP: (105-126)/(54-80) 117/54 mmHg (09/19 0540) SpO2:  [96 %-100 %] 96 % (09/19 0540) Last BM Date: 12/26/14  Intake/Output from previous day: 09/18 0701 - 09/19 0700 In: 2390.4 [P.O.:120; I.V.:2270.4] Out: 1400 [Urine:1400] Intake/Output this shift:    Alert, nontoxic cta /bl Reg Soft, nd, nt   Lab Results:   Recent Labs  12/26/14 0541 12/27/14 0006  WBC 6.5 7.2  HGB 11.9* 10.6*  HCT 35.4* 32.2*  PLT 285 303   BMET  Recent Labs  12/26/14 0541 12/27/14 0007  NA 137 138  K 4.5 4.1  CL 106 104  CO2 25 27  GLUCOSE 99 99  BUN 8 8  CREATININE 0.74 0.70  CALCIUM 8.7* 8.8*   PT/INR  Recent Labs  12/25/14 2035  LABPROT 15.0  INR 1.16   ABG No results for input(s): PHART, HCO3 in the last 72 hours.  Invalid input(s): PCO2, PO2  Studies/Results: Ct Abdomen Pelvis W Contrast  12/25/2014   CLINICAL DATA:  29 yr old female with LUQ pain, extreme nausea, vomiting, and worsening jaundice. Patient states she recently was diagnosised with Liver disease but not told what or given any treatement.  EXAM: CT ABDOMEN AND PELVIS WITH CONTRAST  TECHNIQUE: Multidetector CT imaging of the abdomen and pelvis was performed using the standard protocol following bolus administration of intravenous contrast.  CONTRAST:  OMNIPAQUE IOHEXOL 300 MG/ML  SOLN  COMPARISON:  None.  FINDINGS: Lung bases:  Clear.  Heart size.  Gallbladder and biliary tree: There is a peripherally calcified at 12 mm stone in the distal common bile duct. This leads to intra and extrahepatic bile duct dilation. Common bile duct measures 11 mm in the pancreatic head and 14 mm in the porta hepatis. There is diffuse gallbladder wall  thickening. No stone is seen within the gallbladder. There is also dilation of the pancreatic duct measuring 5 mm.  Liver: Normal in size and morphology. No mass or focal lesion. Intrahepatic duct dilation as described above.  Spleen: Unremarkable.  Pancreas: No mass or inflammatory changes. Duct dilation as detailed above.  Adrenal glands: No masses.  Kidneys, ureters, bladder:  Normal.  Uterus and adnexa: Uterus unremarkable. Apparent 2 cm paraovarian cyst. Ovaries unremarkable. Trace pelvic free fluid.  Lymph nodes:  No adenopathy.  Gastrointestinal: Unremarkable. Appendix not visualized. No evidence of appendicitis.  Musculoskeletal:  Unremarkable.  IMPRESSION: 1. 12 mm stone lies in the distal common bile duct causing significant intern extrahepatic bile duct dilation as well as diffuse gallbladder wall thickening/edema. In addition, stone leads to milder dilation of the pancreatic duct. 2. No radiopaque stones are seen within the gallbladder. 3. No other acute finding.   Electronically Signed   By: Amie Portland M.D.   On: 12/25/2014 19:30   Dg Ercp Biliary & Pancreatic Ducts  12/26/2014   CLINICAL DATA:  Fluoroscopic images from ERCP.  EXAM: ERCP performed by Dr. Levada Schilling.  TECHNIQUE: Multiple spot images obtained with the fluoroscopic device and submitted for interpretation post-procedure.  FLUOROSCOPY TIME:  Number of Acquired Images:  3  COMPARISON:  None.  FINDINGS: Three fluoroscopic images from ERCP demonstrate endoscope overlying the second portion of the duodenum. Contrast opacifies dilated common bile duct. 3  radiklucent filling defects are seen, probably representing stones within the proximal extrahepatic common bile duct.  IMPRESSION: Intra procedural fluoroscopic images from common bile duct stone extraction.  These images were submitted for radiologic interpretation only. Please see the procedural report for the amount of contrast and the fluoroscopy time utilized.   Electronically Signed    By: Ted Mcalpine M.D.   On: 12/26/2014 13:08    Anti-infectives: Anti-infectives    Start     Dose/Rate Route Frequency Ordered Stop   12/27/14 1000  [MAR Hold]  cefTRIAXone (ROCEPHIN) 2 g in dextrose 5 % 50 mL IVPB     (MAR Hold since 12/27/14 0838)   2 g 100 mL/hr over 30 Minutes Intravenous To Surgery 12/26/14 1723 12/28/14 1000   12/26/14 1200  ciprofloxacin (CIPRO) IVPB 400 mg  Status:  Discontinued     400 mg 200 mL/hr over 60 Minutes Intravenous Every 12 hours 12/26/14 1040 12/26/14 1306      Assessment/Plan: Choledocholithiasis Symptomatic cholelithiasis Factor V leiden deficiency  No clinical signs of postERCP pancreatitis. abd exam is benign. Chart reviewed. LFTs trending down.  i think it is ok to proceed with cholecystectomy this am Spoke with pharmacy. It appears heparin gtt was stopped early yesterday for planned ERCP and held after procedure.  Will given 5000 units subcu preoperatively for vte prophylaxis given history  I believe the patient's symptoms are consistent with gallbladder disease.  We discussed gallbladder disease.  I discussed laparoscopic cholecystectomy with IOC in detail.  The patient was shown diagrams detailing the procedure.  We discussed the risks and benefits of a laparoscopic cholecystectomy including, but not limited to bleeding, infection, injury to surrounding structures such as the intestine or liver, bile leak, retained gallstones, need to convert to an open procedure, prolonged diarrhea, blood clots such as  DVT, common bile duct injury, anesthesia risks, and possible need for additional procedures.  We discussed the typical post-operative recovery course. I explained that the likelihood of improvement of their symptoms is good.  We discussed she is higher risk for bleeding/clotting because of her coagulation disorder  Mary Sella. Andrey Campanile, MD, FACS General, Bariatric, & Minimally Invasive Surgery [facility]   s/p  Procedure(s): LAPAROSCOPIC CHOLECYSTECTOMY WITH POSSIBLE INTRAOPERATIVE CHOLANGIOGRAM (N/A)   LOS: 2 days    Atilano Ina 12/27/2014

## 2014-12-27 NOTE — Progress Notes (Signed)
Patient Demographics  Peggy [name], is a 29 y.o. female, DOB - 1986/03/11, WUJ:[id number]  Admit date - 12/25/2014   Admitting Physician Albertine Grates, MD  Outpatient Primary MD for the patient is Pershing Proud  LOS - 2   Chief Complaint  Patient presents with  . Emesis         Subjective:   Peggy [name] today has, No headache, No chest pain, No abdominal pain -No new weakness tingling or numbness, No Cough - SOB, no further nausea or vomiting since admission.   Assessment & Plan    Active Problems:   Choledocholithiasis  Choledocholithiasis - With significantly elevated LFTs, CT abdomen and pelvis with common bile duct dilatation with evidence of stone in distal bile duct. - s/p ERCP 9/18 by Dr. Madilyn Fireman,  with multiple gallstones drained. - Requested surgical consult for evaluation for need for cholecystectomy, patient underwent laparoscopic cholecystectomy by Dr. Andrey Campanile on 12/27/2014. 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  Alcohol use - on CIWA   Code Status: Full  Family Communication: boyfriend at bedside  Disposition Plan: Home once stable   Procedures  ERCP 9/18 2016 by Dr. Madilyn Fireman Laparoscopic cholecystectomy 9/19 by Dr. Andrey Campanile  Consults   GI   Medications  Scheduled Meds: . Influenza vac split quadrivalent PF  0.5 mL Intramuscular Tomorrow-1000  . ketorolac      . pantoprazole (PROTONIX) IV  40 mg Intravenous QHS  . thiamine  100 mg Oral Daily   Or  . thiamine  100 mg Intravenous Daily   Continuous Infusions: . dextrose 5 % and 0.9% NaCl 75 mL/hr at 12/26/14 1757  . heparin     PRN Meds:.ibuprofen, LORazepam **OR** LORazepam, morphine injection, oxyCODONE, simethicone  DVT  Prophylaxis  on heparin drip  Lab Results  Component Value Date   PLT 303 12/27/2014    Antibiotics    Anti-infectives    Start     Dose/Rate Route Frequency Ordered Stop   12/27/14 1000  [MAR Hold]  cefTRIAXone (ROCEPHIN) 2 g in dextrose 5 % 50 mL IVPB     (MAR Hold since 12/27/14 0838)   2 g 100 mL/hr over 30 Minutes Intravenous To Surgery 12/26/14 1723 12/27/14 0945   12/26/14 1200  ciprofloxacin (CIPRO) IVPB 400 mg  Status:  Discontinued     400 mg 200 mL/hr over 60 Minutes Intravenous Every 12 hours 12/26/14 1040 12/26/14 1306          Objective:   Filed Vitals:   12/27/14 1130 12/27/14 1145 12/27/14 1200 12/27/14 1226  BP: 126/94 129/81 120/79 132/84  Pulse: 60 59 54 58  Temp:    98.6 F (37 C)  TempSrc:    Oral  Resp: Height:      Weight:      SpO2: 100% 100% 100% 100%    Wt Readings from Last 3 Encounters:  12/25/14 63.776 kg (140 lb 9.6 oz)  11/04/14 64.411 kg (142 lb)  09/14/14 64.411 kg (142 lb)     Intake/Output Summary (Last 24 hours)  at 12/27/14 1510 Last data filed at 12/27/14 1445  Gross per 24 hour  Intake 3056.25 ml  Output    600 ml  Net 2456.25 ml     Physical Exam  Awake Alert, Oriented X 3, jaundiced [place].AT,PERRAL Supple Neck,No JVD, No cervical lymphadenopathy appriciated.  Symmetrical Chest wall movement, Good air movement bilaterally, CTAB RRR,No Gallops,Rubs or new Murmurs, No Parasternal Heave +ve B.Sounds, Abd Soft, mild tenderness , No organomegaly appriciated, No rebound - guarding or rigidity. No Cyanosis, Clubbing or edema, No new Rash or bruise     Data Review   Micro Results Recent Results (from the past 240 hour(s))  Surgical pcr screen     Status: None   Collection Time: 12/26/14  8:57 AM  Result Value Ref Range Status   MRSA, PCR NEGATIVE NEGATIVE Final   Staphylococcus aureus NEGATIVE NEGATIVE Final    Comment:        The Xpert SA Assay (FDA approved for NASAL specimens in patients over 68  years of age), is one component of a comprehensive surveillance program.  Test performance has been validated by [facility] for patients greater than or equal to 71 year old. It is not intended to diagnose infection nor to guide or monitor treatment.     Radiology Reports Ct Abdomen Pelvis W Contrast  12/25/2014   CLINICAL DATA:  29 yr old female with LUQ pain, extreme nausea, vomiting, and worsening jaundice. Patient states she recently was diagnosised with Liver disease but not told what or given any treatement.  EXAM: CT ABDOMEN AND PELVIS WITH CONTRAST  TECHNIQUE: Multidetector CT imaging of the abdomen and pelvis was performed using the standard protocol following bolus administration of intravenous contrast.  CONTRAST:  OMNIPAQUE IOHEXOL 300 MG/ML  SOLN  COMPARISON:  None.  FINDINGS: Lung bases:  Clear.  Heart size.  Gallbladder and biliary tree: There is a peripherally calcified at 12 mm stone in the distal common bile duct. This leads to intra and extrahepatic bile duct dilation. Common bile duct measures 11 mm in the pancreatic head and 14 mm in the porta hepatis. There is diffuse gallbladder wall thickening. No stone is seen within the gallbladder. There is also dilation of the pancreatic duct measuring 5 mm.  Liver: Normal in size and morphology. No mass or focal lesion. Intrahepatic duct dilation as described above.  Spleen: Unremarkable.  Pancreas: No mass or inflammatory changes. Duct dilation as detailed above.  Adrenal glands: No masses.  Kidneys, ureters, bladder:  Normal.  Uterus and adnexa: Uterus unremarkable. Apparent 2 cm paraovarian cyst. Ovaries unremarkable. Trace pelvic free fluid.  Lymph nodes:  No adenopathy.  Gastrointestinal: Unremarkable. Appendix not visualized. No evidence of appendicitis.  Musculoskeletal:  Unremarkable.  IMPRESSION: 1. 12 mm stone lies in the distal common bile duct causing significant intern extrahepatic bile duct dilation as well as diffuse  gallbladder wall thickening/edema. In addition, stone leads to milder dilation of the pancreatic duct. 2. No radiopaque stones are seen within the gallbladder. 3. No other acute finding.   Electronically Signed   By: Amie Portland M.D.   On: 12/25/2014 19:30   Dg Ercp Biliary & Pancreatic Ducts  12/26/2014   CLINICAL DATA:  Fluoroscopic images from ERCP.  EXAM: ERCP performed by Dr. Levada Schilling.  TECHNIQUE: Multiple spot images obtained with the fluoroscopic device and submitted for interpretation post-procedure.  FLUOROSCOPY TIME:  Number of Acquired Images:  3  COMPARISON:  None.  FINDINGS: Three fluoroscopic images from  ERCP demonstrate endoscope overlying the second portion of the duodenum. Contrast opacifies dilated common bile duct. 3 radiklucent filling defects are seen, probably representing stones within the proximal extrahepatic common bile duct.  IMPRESSION: Intra procedural fluoroscopic images from common bile duct stone extraction.  These images were submitted for radiologic interpretation only. Please see the procedural report for the amount of contrast and the fluoroscopy time utilized.   Electronically Signed   By: Ted Mcalpine M.D.   On: 12/26/2014 13:08     CBC  Recent Labs Lab 12/25/14 1531 12/26/14 0541 12/27/14 0006  WBC 9.3 6.5 7.2  HGB 12.9 11.9* 10.6*  HCT 38.6 35.4* 32.2*  PLT 319 285 303  MCV 90.2 90.8 90.7  MCH 30.1 30.5 29.9  MCHC 33.4 33.6 32.9  RDW 14.3 14.4 14.5    Chemistries   Recent Labs Lab 12/25/14 1531 12/26/14 0541 12/27/14 0007  NA 135 137 138  K 4.0 4.5 4.1  CL 102 106 104  CO2 GLUCOSE 94 99 99  BUN CREATININE 0.83 0.74 0.70  CALCIUM 9.3 8.7* 8.8*  AST 454* 389* 223*  ALT 546* 506* 413*  ALKPHOS 1332* 1208* 1063*  BILITOT 15.1* 12.8* 6.2*   ------------------------------------------------------------------------------------------------------------------ estimated creatinine clearance is 105.4 mL/min (by C-G  formula based on Cr of 0.7). ------------------------------------------------------------------------------------------------------------------ No results for input(s): HGBA1C in the last 72 hours. ------------------------------------------------------------------------------------------------------------------  Recent Labs  12/26/14 0541  CHOL 447*  HDL <10*  LDLCALC NOT CALCULATED  TRIG 117  CHOLHDL NOT CALCULATED   ------------------------------------------------------------------------------------------------------------------ No results for input(s): TSH, T4TOTAL, T3FREE, THYROIDAB in the last 72 hours.  Invalid input(s): FREET3 ------------------------------------------------------------------------------------------------------------------ No results for input(s): VITAMINB12, FOLATE, FERRITIN, TIBC, IRON, RETICCTPCT in the last 72 hours.  Coagulation profile  Recent Labs Lab 12/25/14 2035  INR 1.16    No results for input(s): DDIMER in the last 72 hours.  Cardiac Enzymes No results for input(s): CKMB, TROPONINI, MYOGLOBIN in the last 168 hours.  Invalid input(s): CK ------------------------------------------------------------------------------------------------------------------ Invalid input(s): POCBNP     Time Spent in minutes   30 Gracy Racer, DAWOOD M.D on 12/27/2014 at 3:10 PM  Between 7am to 7pm - Pager - [phone]  After 7pm go to [email or web address] - password [facility]  [facility]   Office  [phone]

## 2014-12-27 NOTE — Op Note (Signed)
Peggy [name] [id number] Aug 20, 1985 12/27/2014  Laparoscopic Cholecystectomy Procedure Note  Indications: This patient presents with symptomatic gallbladder disease and choledocholithiasis and will undergo laparoscopic cholecystectomy. She underwent ERCP and sphincterotomy yesterday and retrieval of CBD stones.   Pre-operative Diagnosis: choledocholithiasis s/p ERCP, symptomatic cholelithiasis  Post-operative Diagnosis: choledocholithiasis s/p ERCP, acute calculous cholecystitis  Surgeon: Atilano Ina   Assistants: Barnetta Chapel PA-C  Anesthesia: General endotracheal anesthesia  ASA Class: 2  Procedure Details  The patient was seen again in the Holding Room. The risks, benefits, complications, treatment options, and expected outcomes were discussed with the patient. The possibilities of reaction to medication, pulmonary aspiration, perforation of viscus, bleeding, recurrent infection, finding a normal gallbladder, the need for additional procedures, failure to diagnose a condition, the possible need to convert to an open procedure, and creating a complication requiring transfusion or operation were discussed with the patient. The likelihood of improving the patient's symptoms with return to their baseline status is good.  The patient and/or family concurred with the proposed plan, giving informed consent. The site of surgery properly noted. The patient was taken to Operating Room, identified as Peggy [name] and the procedure verified as Laparoscopic Cholecystectomy with possible Intraoperative Cholangiogram. A Time Out was held and the above information confirmed. Antibiotic prophylaxis was administered. She received 5000 units of subcu heparin preoperatively due to her factor V deficiency.   Prior to the induction of general anesthesia, antibiotic prophylaxis was administered. General endotracheal anesthesia was then administered and tolerated well. After the induction, the abdomen was  prepped with Chloraprep and draped in the sterile fashion. The patient was positioned in the supine position.  Local anesthetic agent was injected into the skin near the umbilicus and an incision made. We dissected down to the abdominal fascia with blunt dissection.  The fascia was incised vertically and we entered the peritoneal cavity bluntly.  A pursestring suture of 0-Vicryl was placed around the fascial opening.  The Hasson cannula was inserted and secured with the stay suture.  Pneumoperitoneum was then created with CO2 and tolerated well without any adverse changes in the patient's vital signs. An 5-mm port was placed in the subxiphoid position.  Two 5-mm ports were placed in the right upper quadrant. All skin incisions were infiltrated with a local anesthetic agent before making the incision and placing the trocars.   We positioned the patient in reverse Trendelenburg, tilted slightly to the patient's left.  The gallbladder wall was edematous and the liver was discolored - dark.  The gallbladder was identified, the fundus grasped and retracted cephalad. Adhesions were lysed bluntly and with the electrocautery where indicated, taking care not to injure any adjacent organs or viscus. The infundibulum was grasped and retracted laterally, exposing the peritoneum overlying the triangle of Calot. This was then divided and exposed in a blunt fashion. A critical view of the cystic duct and cystic artery was obtained.  The cystic duct was clearly identified and bluntly dissected circumferentially. It was dilated but clearly was the cystic duct since it going directly into the gallbladder.    The cystic artery was identified, dissected free, ligated with clips and divided as well.  The cystic duct was too dilated to accommodate a clip so I transected it as it the gallbladder and then snared the cystic duct stump with a PDS endoloop.   The gallbladder was dissected from the liver bed in retrograde fashion with  the electrocautery. The gallbladder was removed and placed in an Ecco sac.  The gallbladder and Ecco sac were then removed through the umbilical port site. The liver bed was irrigated and inspected. Hemostasis was achieved with the electrocautery. Copious irrigation was utilized and was repeatedly aspirated until clear.  The pursestring suture was used to close the umbilical fascia.    We again inspected the right upper quadrant for hemostasis.  The umbilical closure was inspected and there was no air leak and nothing trapped within the closure. Pneumoperitoneum was released as we removed the trocars.  4-0 Monocryl was used to close the skin.   Benzoin, steri-strips, and clean dressings were applied. The patient was then extubated and brought to the recovery room in stable condition. Instrument, sponge, and needle counts were correct at closure and at the conclusion of the case.   Findings: Cholecystitis with Cholelithiasis  Estimated Blood Loss: Minimal         Drains: none         Specimens: Gallbladder           Complications: None; patient tolerated the procedure well.         Disposition: PACU - hemodynamically stable.         Condition: stable  Mary Sella. Andrey Campanile, MD, FACS General, Bariatric, & Minimally Invasive Surgery [facility], [place]

## 2014-12-27 NOTE — Anesthesia Preprocedure Evaluation (Addendum)
Anesthesia Evaluation  Patient identified by MRN, date of birth, ID band Patient awake    Reviewed: Allergy & Precautions, NPO status , Patient's Chart, lab work & pertinent test results  Airway Mallampati: II  TM Distance: >3 FB Neck ROM: Full    Dental no notable dental hx.    Pulmonary Current Smoker,    Pulmonary exam normal breath sounds clear to auscultation       Cardiovascular negative cardio ROS Normal cardiovascular exam Rhythm:Regular Rate:Normal     Neuro/Psych negative neurological ROS  negative psych ROS   GI/Hepatic negative GI ROS, Neg liver ROS,   Endo/Other  negative endocrine ROS  Renal/GU negative Renal ROS  negative genitourinary   Musculoskeletal negative musculoskeletal ROS (+)   Abdominal   Peds negative pediatric ROS (+)  Hematology Factor V deficiency   Anesthesia Other Findings   Reproductive/Obstetrics negative OB ROS                            Anesthesia Physical Anesthesia Plan  ASA: II  Anesthesia Plan: General   Post-op Pain Management:    Induction: Intravenous  Airway Management Planned: Oral ETT  Additional Equipment:   Intra-op Plan:   Post-operative Plan: Extubation in OR  Informed Consent: I have reviewed the patients History and Physical, chart, labs and discussed the procedure including the risks, benefits and alternatives for the proposed anesthesia with the patient or authorized representative who has indicated his/her understanding and acceptance.   Dental advisory given  Plan Discussed with: CRNA and Surgeon  Anesthesia Plan Comments:         Anesthesia Quick Evaluation

## 2014-12-27 NOTE — Anesthesia Postprocedure Evaluation (Signed)
  Anesthesia Post-op Note  Patient: Peggy [name]  Procedure(s) Performed: Procedure(s): LAPAROSCOPIC CHOLECYSTECTOMY  (N/A)  Patient Location: PACU  Anesthesia Type:General  Level of Consciousness: awake, alert , oriented and patient cooperative  Airway and Oxygen Therapy: Patient Spontanous Breathing  Post-op Pain: mild  Post-op Assessment: Post-op Vital signs reviewed, Patient's Cardiovascular Status Stable, Respiratory Function Stable, Patent Airway, No signs of Nausea or vomiting and Pain level controlled              Post-op Vital Signs: Reviewed and stable  Last Vitals:  Filed Vitals:   12/27/14 1226  BP: 132/84  Pulse: 58  Temp: 37 C  Resp: 17    Complications: No apparent anesthesia complications

## 2014-12-27 NOTE — Transfer of Care (Signed)
Immediate Anesthesia Transfer of Care Note  Patient: Peggy [name]  Procedure(s) Performed: Procedure(s): LAPAROSCOPIC CHOLECYSTECTOMY  (N/A)  Patient Location: PACU  Anesthesia Type:General  Level of Consciousness: awake, sedated and patient cooperative  Airway & Oxygen Therapy: Patient Spontanous Breathing and Patient connected to nasal cannula oxygen  Post-op Assessment: Report given to RN, Post -op Vital signs reviewed and stable and Patient moving all extremities  Post vital signs: Reviewed and stable  Last Vitals:  Filed Vitals:   12/27/14 0540  BP: 117/54  Pulse: 103  Temp: 38.2 C  Resp: 16    Complications: No apparent anesthesia complications

## 2014-12-27 NOTE — Anesthesia Procedure Notes (Signed)
Procedure Name: Intubation Date/Time: 12/27/2014 9:44 AM Performed by: Coralee Rud Pre-anesthesia Checklist: Patient identified, Emergency Drugs available, Suction available and Patient being monitored Patient Re-evaluated:Patient Re-evaluated prior to inductionOxygen Delivery Method: Circle system utilized Preoxygenation: Pre-oxygenation with 100% oxygen Intubation Type: IV induction Ventilation: Mask ventilation without difficulty Laryngoscope Size: Miller and 3 Grade View: Grade I Tube type: Oral Tube size: 7.0 mm Number of attempts: 1 Airway Equipment and Method: Stylet Placement Confirmation: ETT inserted through vocal cords under direct vision,  positive ETCO2 and breath sounds checked- equal and bilateral Secured at: 22 cm Tube secured with: Tape Dental Injury: Teeth and Oropharynx as per pre-operative assessment

## 2014-12-27 NOTE — Progress Notes (Signed)
ANTICOAGULATION CONSULT NOTE - Follow Up Consult  Pharmacy Consult for heparin Indication: hx PE and Factor V Leiden deficiency   Allergies  Allergen Reactions  . Caffeine Anaphylaxis  . Peanut Butter Flavor Anaphylaxis    Patient Measurements: Height:  (177.8 cm) Weight: 140 lb 9.6 oz (63.776 kg) IBW/kg (Calculated) : 68.5  Vital Signs: Temp: 98.6 F (37 C) (09/19 1226) Temp Source: Oral (09/19 1226) BP: 132/84 mmHg (09/19 1226) Pulse Rate: 58 (09/19 1226)  Labs:  Recent Labs  12/25/14 1531 12/25/14 2035 12/26/14 0541 12/27/14 0006 12/27/14 0007  HGB 12.9  --  11.9* 10.6*  --   HCT 38.6  --  35.4* 32.2*  --   PLT 319  --  285 303  --   APTT  --  29  --   --   --   LABPROT  --  15.0  --   --   --   INR  --  1.16  --   --   --   CREATININE 0.83  --  0.74  --  0.70    Estimated Creatinine Clearance: 105.4 mL/min (by C-G formula based on Cr of 0.7).  Assessment: 89 y/ female with N/V and abdominal pain for 1 week now s/p ERCP with gall stone removal 9/18 and s/p lap cholecystectomy this morning. Pharmacy consulted to resume heparin drip at 17:00 today with no bolus. Hb low stable, platelets are normal.  Goal of Therapy:  Heparin level 0.3-0.7 units/ml Monitor platelets by anticoagulation protocol: Yes   Plan:  - Heparin drip at 1000 units/hr with no bolus - to begin 17:00 - 6 hr heparin level - Daily heparin level and CBC - Monitor for s/sx of bleeding  [facility], [place].D., BCPS Clinical Pharmacist Pager: [phone] 12/27/2014 12:41 PM

## 2014-12-28 ENCOUNTER — Encounter (HOSPITAL_COMMUNITY): Payer: Self-pay | Admitting: General Surgery

## 2014-12-28 LAB — CBC
HEMATOCRIT: 32 % — AB (ref 36.0–46.0)
HEMOGLOBIN: 10.7 g/dL — AB (ref 12.0–15.0)
MCH: 30.1 pg (ref 26.0–34.0)
MCHC: 33.4 g/dL (ref 30.0–36.0)
MCV: 90.1 fL (ref 78.0–100.0)
Platelets: 361 10*3/uL (ref 150–400)
RBC: 3.55 MIL/uL — ABNORMAL LOW (ref 3.87–5.11)
RDW: 14.8 % (ref 11.5–15.5)
WBC: 9.7 10*3/uL (ref 4.0–10.5)

## 2014-12-28 LAB — COMPREHENSIVE METABOLIC PANEL
ALK PHOS: 845 U/L — AB (ref 38–126)
ALT: 278 U/L — ABNORMAL HIGH (ref 14–54)
ANION GAP: 7 (ref 5–15)
AST: 97 U/L — ABNORMAL HIGH (ref 15–41)
Albumin: 2.4 g/dL — ABNORMAL LOW (ref 3.5–5.0)
BILIRUBIN TOTAL: 3.4 mg/dL — AB (ref 0.3–1.2)
BUN: <5 mg/dL — ABNORMAL LOW (ref 6–20)
CALCIUM: 8.8 mg/dL — AB (ref 8.9–10.3)
CO2: 26 mmol/L (ref 22–32)
Chloride: 104 mmol/L (ref 101–111)
Creatinine, Ser: 0.7 mg/dL (ref 0.44–1.00)
Glucose, Bld: 158 mg/dL — ABNORMAL HIGH (ref 65–99)
Potassium: 4.1 mmol/L (ref 3.5–5.1)
Sodium: 137 mmol/L (ref 135–145)
TOTAL PROTEIN: 5.3 g/dL — AB (ref 6.5–8.1)

## 2014-12-28 LAB — HEPARIN LEVEL (UNFRACTIONATED)
Heparin Unfractionated: 0.21 IU/mL — ABNORMAL LOW (ref 0.30–0.70)
Heparin Unfractionated: 0.37 IU/mL (ref 0.30–0.70)

## 2014-12-28 MED ORDER — PANTOPRAZOLE SODIUM 40 MG PO TBEC
40.0000 mg | DELAYED_RELEASE_TABLET | Freq: Every day | ORAL | Status: DC
  Administered 2014-12-28 – 2014-12-29 (×2): 40 mg via ORAL
  Filled 2014-12-28 (×2): qty 1

## 2014-12-28 MED ORDER — ENOXAPARIN SODIUM 40 MG/0.4ML ~~LOC~~ SOLN
40.0000 mg | SUBCUTANEOUS | Status: DC
  Administered 2014-12-28: 40 mg via SUBCUTANEOUS
  Filled 2014-12-28 (×2): qty 0.4

## 2014-12-28 NOTE — Progress Notes (Signed)
ANTICOAGULATION CONSULT NOTE Pharmacy Consult for heparin Indication: hx PE and Factor V Leiden deficiency   Allergies  Allergen Reactions  . Caffeine Anaphylaxis  . Peanut Butter Flavor Anaphylaxis    Patient Measurements: Height:  (177.8 cm) Weight: 141 lb 1.5 oz (64 kg) IBW/kg (Calculated) : 68.5  Vital Signs: Temp: 98.1 F (36.7 C) (09/19 2054) Temp Source: Oral (09/19 2054) BP: 114/67 mmHg (09/19 2054) Pulse Rate: 58 (09/19 2054)  Labs:  Recent Labs  12/25/14 1531 12/25/14 2035 12/26/14 0541 12/27/14 0006 12/27/14 0007 12/28/14 0111  HGB 12.9  --  11.9* 10.6*  --  10.7*  HCT 38.6  --  35.4* 32.2*  --  32.0*  PLT 319  --  285 303  --  361  APTT  --  29  --   --   --   --   LABPROT  --  15.0  --   --   --   --   INR  --  1.16  --   --   --   --   HEPARINUNFRC  --   --   --   --   --  0.21*  CREATININE 0.83  --  0.74  --  0.70  --     Estimated Creatinine Clearance: 105.8 mL/min (by C-G formula based on Cr of 0.7).  Assessment: 29 y/o female s/p ERCP with gall stone removal 9/18 and s/p lap cholecystectomy, h/o PE, for heparin  Goal of Therapy:  Heparin level 0.3-0.7 units/ml Monitor platelets by anticoagulation protocol: Yes   Plan:  Increase Heparin 1200 units/hr Follow-up am labs.  Geannie Risen, PharmD, BCPS   12/28/2014 1:47 AM

## 2014-12-28 NOTE — Progress Notes (Signed)
Patient Demographics  Peggy [name], is a 29 y.o. female, DOB - 09/10/1985, ZHY:[id number]  Admit date - 12/25/2014   Admitting Physician Albertine Grates, MD  Outpatient Primary MD for the patient is Pershing Proud  LOS - 3   Chief Complaint  Patient presents with  . Emesis         Subjective:   Peggy [name] today has, No headache, No chest pain, No abdominal pain -No new weakness tingling or numbness, No Cough - SOB, no further nausea or vomiting since admission.   Assessment & Plan    Active Problems:   Choledocholithiasis   Elevated LFTs  Choledocholithiasis - With significantly elevated LFTs, CT abdomen and pelvis with common bile duct dilatation with evidence of stone in distal bile duct. - s/p ERCP 9/18 by Dr. Madilyn Fireman,  with multiple gallstones drained. -  patient underwent laparoscopic cholecystectomy by Dr. Andrey Campanile on 12/27/2014. 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  Alcohol use - on CIWA   Code Status: Full  Family Communication: boyfriend at bedside  Disposition Plan: Home once stable   Procedures  ERCP 9/18 2016 by Dr. Madilyn Fireman Laparoscopic cholecystectomy 9/19 by Dr. Andrey Campanile  Consults   GI   Medications  Scheduled Meds: . enoxaparin (LOVENOX) injection  40 mg Subcutaneous Q24H  . Influenza vac split quadrivalent PF  0.5 mL Intramuscular Tomorrow-1000  . pantoprazole  40 mg Oral Q supper  . thiamine  100 mg Oral Daily   Or  . thiamine   100 mg Intravenous Daily   Continuous Infusions: . dextrose 5 % and 0.9% NaCl 75 mL/hr at 12/28/14 1448   PRN Meds:.ibuprofen, LORazepam **OR** LORazepam, morphine injection, oxyCODONE, simethicone  DVT Prophylaxis  on heparin drip  Lab Results  Component Value Date   PLT 361 12/28/2014    Antibiotics    Anti-infectives    Start     Dose/Rate Route Frequency Ordered Stop   12/27/14 1000  [MAR Hold]  cefTRIAXone (ROCEPHIN) 2 g in dextrose 5 % 50 mL IVPB     (MAR Hold since 12/27/14 0838)   2 g 100 mL/hr over 30 Minutes Intravenous To Surgery 12/26/14 1723 12/27/14 0945   12/26/14 1200  ciprofloxacin (CIPRO) IVPB 400 mg  Status:  Discontinued     400 mg 200 mL/hr over 60 Minutes Intravenous Every 12 hours 12/26/14 1040 12/26/14 1306          Objective:   Filed Vitals:   12/27/14 1554 12/27/14 2054 12/28/14 0451 12/28/14 2021  BP: 97/57 114/67 97/56 119/78  Pulse: 60 58 62 60  Temp: 98.7 F (37.1 C) 98.1 F (36.7 C) 98.2 F (36.8 C) 97.5 F (36.4 C)  TempSrc: Oral Oral Oral Oral  Resp: Height:   (1.778 m)  Weight:  64 kg (141 lb 1.5 oz)  65.6 kg (144 lb 10 oz)  SpO2: 100% 99% 98% 99%    Wt Readings from Last 3 Encounters:  12/28/14 65.6 kg (144 lb 10 oz)  11/04/14 64.411 kg (142 lb)  09/14/14 64.411 kg (142 lb)     Intake/Output Summary (Last 24 hours) at 12/28/14 2041 Last data filed at 12/28/14 1800  Gross per 24 hour  Intake 2531.75 ml  Output    100 ml  Net 2431.75 ml     Physical Exam  Awake Alert, Oriented X 3, jaundiced [place].AT,PERRAL Supple Neck,No JVD, No cervical lymphadenopathy appriciated.  Symmetrical Chest wall movement, Good air movement bilaterally, CTAB RRR,No Gallops,Rubs or new Murmurs, No Parasternal Heave +ve B.Sounds, Abd Soft, no tenderness , No organomegaly appriciated, No rebound - guarding or rigidity. No Cyanosis, Clubbing or edema, No new Rash or bruise     Data Review   Micro Results Recent  Results (from the past 240 hour(s))  Surgical pcr screen     Status: None   Collection Time: 12/26/14  8:57 AM  Result Value Ref Range Status   MRSA, PCR NEGATIVE NEGATIVE Final   Staphylococcus aureus NEGATIVE NEGATIVE Final    Comment:        The Xpert SA Assay (FDA approved for NASAL specimens in patients over 55 years of age), is one component of a comprehensive surveillance program.  Test performance has been validated by [facility] for patients greater than or equal to 89 year old. It is not intended to diagnose infection nor to guide or monitor treatment.     Radiology Reports Ct Abdomen Pelvis W Contrast  12/25/2014   CLINICAL DATA:  29 yr old female with LUQ pain, extreme nausea, vomiting, and worsening jaundice. Patient states she recently was diagnosised with Liver disease but not told what or given any treatement.  EXAM: CT ABDOMEN AND PELVIS WITH CONTRAST  TECHNIQUE: Multidetector CT imaging of the abdomen and pelvis was performed using the standard protocol following bolus administration of intravenous contrast.  CONTRAST:  OMNIPAQUE IOHEXOL 300 MG/ML  SOLN  COMPARISON:  None.  FINDINGS: Lung bases:  Clear.  Heart size.  Gallbladder and biliary tree: There is a peripherally calcified at 12 mm stone in the distal common bile duct. This leads to intra and extrahepatic bile duct dilation. Common bile duct measures 11 mm in the pancreatic head and 14 mm in the porta hepatis. There is diffuse gallbladder wall thickening. No stone is seen within the gallbladder. There is also dilation of the pancreatic duct measuring 5 mm.  Liver: Normal in size and morphology. No mass or focal lesion. Intrahepatic duct dilation as described above.  Spleen: Unremarkable.  Pancreas: No mass or inflammatory changes. Duct dilation as detailed above.  Adrenal glands: No masses.  Kidneys, ureters, bladder:  Normal.  Uterus and adnexa: Uterus unremarkable. Apparent 2 cm paraovarian cyst. Ovaries  unremarkable. Trace pelvic free fluid.  Lymph nodes:  No adenopathy.  Gastrointestinal: Unremarkable. Appendix not visualized. No evidence of appendicitis.  Musculoskeletal:  Unremarkable.  IMPRESSION: 1. 12 mm stone lies in the distal common bile duct causing significant intern extrahepatic bile duct dilation as well as diffuse gallbladder wall thickening/edema. In addition, stone leads to milder dilation of the pancreatic duct. 2. No radiopaque stones are seen within the gallbladder. 3. No other acute finding.   Electronically Signed   By: Amie Portland M.D.   On: 12/25/2014 19:30   Dg  Ercp Biliary & Pancreatic Ducts  12/26/2014   CLINICAL DATA:  Fluoroscopic images from ERCP.  EXAM: ERCP performed by Dr. Levada Schilling.  TECHNIQUE: Multiple spot images obtained with the fluoroscopic device and submitted for interpretation post-procedure.  FLUOROSCOPY TIME:  Number of Acquired Images:  3  COMPARISON:  None.  FINDINGS: Three fluoroscopic images from ERCP demonstrate endoscope overlying the second portion of the duodenum. Contrast opacifies dilated common bile duct. 3 radiklucent filling defects are seen, probably representing stones within the proximal extrahepatic common bile duct.  IMPRESSION: Intra procedural fluoroscopic images from common bile duct stone extraction.  These images were submitted for radiologic interpretation only. Please see the procedural report for the amount of contrast and the fluoroscopy time utilized.   Electronically Signed   By: Ted Mcalpine M.D.   On: 12/26/2014 13:08     CBC  Recent Labs Lab 12/25/14 1531 12/26/14 0541 12/27/14 0006 12/28/14 0111  WBC 9.3 6.5 7.2 9.7  HGB 12.9 11.9* 10.6* 10.7*  HCT 38.6 35.4* 32.2* 32.0*  PLT 319 285 303 361  MCV 90.2 90.8 90.7 90.1  MCH 30.1 30.5 29.9 30.1  MCHC 33.4 33.6 32.9 33.4  RDW 14.3 14.4 14.5 14.8    Chemistries   Recent Labs Lab 12/25/14 1531 12/26/14 0541 12/27/14 0007 12/28/14 0111  NA 135 137 138 137   K 4.0 4.5 4.1 4.1  CL 102 106 104 104  CO2 GLUCOSE 94 99 99 158*  BUN <5*  CREATININE 0.83 0.74 0.70 0.70  CALCIUM 9.3 8.7* 8.8* 8.8*  AST 454* 389* 223* 97*  ALT 546* 506* 413* 278*  ALKPHOS 1332* 1208* 1063* 845*  BILITOT 15.1* 12.8* 6.2* 3.4*   ------------------------------------------------------------------------------------------------------------------ estimated creatinine clearance is 108.4 mL/min (by C-G formula based on Cr of 0.7). ------------------------------------------------------------------------------------------------------------------ No results for input(s): HGBA1C in the last 72 hours. ------------------------------------------------------------------------------------------------------------------  Recent Labs  12/26/14 0541  CHOL 447*  HDL <10*  LDLCALC NOT CALCULATED  TRIG 117  CHOLHDL NOT CALCULATED   ------------------------------------------------------------------------------------------------------------------ No results for input(s): TSH, T4TOTAL, T3FREE, THYROIDAB in the last 72 hours.  Invalid input(s): FREET3 ------------------------------------------------------------------------------------------------------------------ No results for input(s): VITAMINB12, FOLATE, FERRITIN, TIBC, IRON, RETICCTPCT in the last 72 hours.  Coagulation profile  Recent Labs Lab 12/25/14 2035  INR 1.16    No results for input(s): DDIMER in the last 72 hours.  Cardiac Enzymes No results for input(s): CKMB, TROPONINI, MYOGLOBIN in the last 168 hours.  Invalid input(s): CK ------------------------------------------------------------------------------------------------------------------ Invalid input(s): POCBNP     Time Spent in minutes   30 Gracy Racer, DAWOOD M.D on 12/28/2014 at 8:41 PM  Between 7am to 7pm - Pager - [phone]  After 7pm go to [email or web address] - password [facility]  [facility]   Office   [phone]

## 2014-12-28 NOTE — Progress Notes (Signed)
1 Day Post-Op  Subjective: Not much pain. No n/v. Ate entire breakfast.   Objective: Vital signs in last 24 hours: Temp:  [98 F (36.7 C)-98.7 F (37.1 C)] 98.2 F (36.8 C) (09/20 0451) Pulse Rate:  [54-62] 62 (09/20 0451) Resp:  [14-18] 16 (09/20 0451) BP: (97-132)/(56-94) 97/56 mmHg (09/20 0451) SpO2:  [98 %-100 %] 98 % (09/20 0451) Weight:  [64 kg (141 lb 1.5 oz)] 64 kg (141 lb 1.5 oz) (09/19 2054) Last BM Date: 12/26/14  Intake/Output from previous day: 09/19 0701 - 09/20 0700 In: 4399.3 [P.O.:1440; I.V.:2959.3] Out: 600 [Urine:600] Intake/Output this shift:    Alert, nontoxic cta b/l Reg Soft, min TTP, dressing c/d/i  Lab Results:   Recent Labs  12/27/14 0006 12/28/14 0111  WBC 7.2 9.7  HGB 10.6* 10.7*  HCT 32.2* 32.0*  PLT 303 361   BMET  Recent Labs  12/27/14 0007 12/28/14 0111  NA 138 137  K 4.1 4.1  CL 104 104  CO2 27 26  GLUCOSE 99 158*  BUN 8 <5*  CREATININE 0.70 0.70  CALCIUM 8.8* 8.8*   PT/INR  Recent Labs  12/25/14 2035  LABPROT 15.0  INR 1.16   ABG No results for input(s): PHART, HCO3 in the last 72 hours.  Invalid input(s): PCO2, PO2  Studies/Results: Dg Ercp Biliary & Pancreatic Ducts  12/26/2014   CLINICAL DATA:  Fluoroscopic images from ERCP.  EXAM: ERCP performed by Dr. Levada Schilling.  TECHNIQUE: Multiple spot images obtained with the fluoroscopic device and submitted for interpretation post-procedure.  FLUOROSCOPY TIME:  Number of Acquired Images:  3  COMPARISON:  None.  FINDINGS: Three fluoroscopic images from ERCP demonstrate endoscope overlying the second portion of the duodenum. Contrast opacifies dilated common bile duct. 3 radiklucent filling defects are seen, probably representing stones within the proximal extrahepatic common bile duct.  IMPRESSION: Intra procedural fluoroscopic images from common bile duct stone extraction.  These images were submitted for radiologic interpretation only. Please see the procedural report  for the amount of contrast and the fluoroscopy time utilized.   Electronically Signed   By: Ted Mcalpine M.D.   On: 12/26/2014 13:08    Anti-infectives: Anti-infectives    Start     Dose/Rate Route Frequency Ordered Stop   12/27/14 1000  [MAR Hold]  cefTRIAXone (ROCEPHIN) 2 g in dextrose 5 % 50 mL IVPB     (MAR Hold since 12/27/14 0838)   2 g 100 mL/hr over 30 Minutes Intravenous To Surgery 12/26/14 1723 12/27/14 0945   12/26/14 1200  ciprofloxacin (CIPRO) IVPB 400 mg  Status:  Discontinued     400 mg 200 mL/hr over 60 Minutes Intravenous Every 12 hours 12/26/14 1040 12/26/14 1306      Assessment/Plan: s/p Procedure(s): LAPAROSCOPIC CHOLECYSTECTOMY  (N/A)  h/o factor V leiden deficiency  Doing well hgb stable. No signs of bleeding Anticoagulation per medicine  Discussed dc instructions.   Peggy [name]. Peggy Campanile, MD, FACS General, Bariatric, & Minimally Invasive Surgery [facility], [place]   LOS: 3 days    Peggy [name] 12/28/2014

## 2014-12-28 NOTE — Discharge Instructions (Signed)
[facility] °LAPAROSCOPIC SURGERY: POST OP INSTRUCTIONS °Always review your discharge instruction sheet given to you by the facility where your surgery was performed. °IF YOU HAVE DISABILITY OR FAMILY LEAVE FORMS, YOU MUST BRING THEM TO THE OFFICE FOR PROCESSING.   °DO NOT GIVE THEM TO YOUR DOCTOR. ° °1. A prescription for pain medication may be given to you upon discharge.  Take your pain medication as prescribed, if needed.  If narcotic pain medicine is not needed, then you may take acetaminophen (Tylenol) or ibuprofen (Advil) as needed. °2. Take your usually prescribed medications unless otherwise directed. °3. If you need a refill on your pain medication, please contact your pharmacy.  They will contact our office to request authorization. Prescriptions will not be filled after 5pm or on week-ends. °4. You should follow a light diet the first few days after arrival home, such as soup and crackers, etc.  Be sure to include lots of fluids daily. °5. Most patients will experience some swelling and bruising in the area of the incisions.  Ice packs will help.  Swelling and bruising can take several days to resolve.  °6. It is common to experience some constipation if taking pain medication after surgery.  Increasing fluid intake and taking a stool softener (such as Colace) will usually help or prevent this problem from occurring.  A mild laxative (Milk of Magnesia or Miralax) should be taken according to package instructions if there are no bowel movements after 48 hours. °7. Unless discharge instructions indicate otherwise, you may remove your bandages 48 hours after surgery, and you may shower at that time.  You may have steri-strips (small skin tapes) in place directly over the incision.  These strips should be left on the skin for 7-10 days.  °8. ACTIVITIES:  You may resume regular (light) daily activities beginning the next day--such as daily self-care, walking, climbing stairs--gradually  increasing activities as tolerated.  You may have sexual intercourse when it is comfortable.  Refrain from any heavy lifting or straining until approved by your doctor. °a. You may drive when you are no longer taking prescription pain medication, you can comfortably wear a seatbelt, and you can safely maneuver your car and apply brakes. °9. You should see your doctor in the office for a follow-up appointment approximately 2-3 weeks after your surgery.  Make sure that you call for this appointment within a day or two after you arrive home to insure a convenient appointment time. °10. OTHER INSTRUCTIONS:  °WHEN TO CALL YOUR DOCTOR: °1. Fever over 101.0 °2. Inability to urinate °3. Continued bleeding from incision. °4. Increased pain, redness, or drainage from the incision. °5. Increasing abdominal pain ° °The clinic staff is available to answer your questions during regular business hours.  Please don’t hesitate to call and ask to speak to one of the nurses for clinical concerns.  If you have a medical emergency, go to the nearest emergency room or call 911.  A surgeon from [facility] is always on call at the hospital. °[address], [address], [place], [place]  [address] ? [address] [address], [place], [place]   [address] °[phone] ? [phone] ? [phone] [phone] °Web site: [email or web address] ° °

## 2014-12-28 NOTE — Care Management Note (Signed)
Case Management Note  Patient Details  Name: Peggy [name] MRN: [id number] Date of Birth: 02-26-1986  Subjective/Objective:         CM following for progression and d/c planning.           Action/Plan: No HH or DME needs identified. Pt will d/c to home with selfcare.   Expected Discharge Date:       12/28/2014           Expected Discharge Plan:  Home/Self Care  In-House Referral:  NA  Discharge planning Services  NA  Post Acute Care Choice:  NA Choice offered to:  NA  DME Arranged:  N/A DME Agency:  NA  HH Arranged:  NA HH Agency:  NA  Status of Service:  Completed, signed off  Medicare Important Message Given:    Date Medicare IM Given:    Medicare IM give by:    Date Additional Medicare IM Given:    Additional Medicare Important Message give by:     If discussed at Long Length of Stay Meetings, dates discussed:    Additional Comments:  Starlyn Skeans, RN 12/28/2014, 11:58 AM

## 2014-12-28 NOTE — Progress Notes (Addendum)
ANTICOAGULATION CONSULT NOTE  Pharmacy Consult for enoxaparin dosing Indication: hx PE and Factor V Leiden deficiency   29 y/o female s/p ERCP with gall stone removal 9/18 and s/p lap cholecystectomy, h/o PE and Factor V Leiden deficiency. Heparin was transitioned to home enoxaparin dose this morning.  Spoke with Dr. Randol Kern re: enoxaparin dose which is low if patient to be on full dose anticoagulation. He asked that I investigate. I spoke with patient who tells me she actively injects enoxaparin and that Dr. Dietrich Pates at [facility] clinic prescribes it. She tells me she fills it at [facility] in [place] or [facility]. I called  Cardiology clinic in [place] and patient is not active at their clinic. The receptionist tells me Dr. Dietrich Pates retired 3 years ago. I called both pharmacies and [facility] not filled [facility] since 2014, [facility] has not filled since 2012. I spoke with patient again and asked if it was filled under another name or if pharmacy had incorrect DOB and she denies. Patient will try to get a picture of prescription prior to discharge so that dose can be clarified. She will call me with this.  [place], [address].D., BCPS Clinical Pharmacist Pager: [phone] 12/28/2014 11:43 AM   Addendum: Sherron Monday with patient again. Her boyfriend cannot send a picture of the Lovenox prescription until he gets off work. She tells me she called the Cardiology clinic to release any records to [place]. She gave me the phone number and it is the same number I called earlier. I called the clinic again and they state they have not spoken to her today.   I have exhausted all avenues to try and determine why her Lovenox dose is low and I am uncertain she really takes Lovenox as she describes.   [place], [address].D., BCPS Clinical Pharmacist Pager: [phone] 12/28/2014 4:29 PM

## 2014-12-29 DIAGNOSIS — R7989 Other specified abnormal findings of blood chemistry: Secondary | ICD-10-CM

## 2014-12-29 DIAGNOSIS — D6851 Activated protein C resistance: Secondary | ICD-10-CM

## 2014-12-29 LAB — HEPATIC FUNCTION PANEL
ALBUMIN: 2.4 g/dL — AB (ref 3.5–5.0)
ALT: 181 U/L — AB (ref 14–54)
AST: 54 U/L — AB (ref 15–41)
Alkaline Phosphatase: 641 U/L — ABNORMAL HIGH (ref 38–126)
BILIRUBIN DIRECT: 1.5 mg/dL — AB (ref 0.1–0.5)
Indirect Bilirubin: 1.4 mg/dL — ABNORMAL HIGH (ref 0.3–0.9)
TOTAL PROTEIN: 5 g/dL — AB (ref 6.5–8.1)
Total Bilirubin: 2.9 mg/dL — ABNORMAL HIGH (ref 0.3–1.2)

## 2014-12-29 LAB — CBC
HCT: 31.5 % — ABNORMAL LOW (ref 36.0–46.0)
Hemoglobin: 10.3 g/dL — ABNORMAL LOW (ref 12.0–15.0)
MCH: 30.2 pg (ref 26.0–34.0)
MCHC: 32.7 g/dL (ref 30.0–36.0)
MCV: 92.4 fL (ref 78.0–100.0)
PLATELETS: 408 10*3/uL — AB (ref 150–400)
RBC: 3.41 MIL/uL — AB (ref 3.87–5.11)
RDW: 15.3 % (ref 11.5–15.5)
WBC: 7.6 10*3/uL (ref 4.0–10.5)

## 2014-12-29 MED ORDER — ENOXAPARIN SODIUM 40 MG/0.4ML ~~LOC~~ SOLN
40.0000 mg | Freq: Two times a day (BID) | SUBCUTANEOUS | Status: DC
  Administered 2014-12-29 – 2014-12-30 (×2): 40 mg via SUBCUTANEOUS
  Filled 2014-12-29: qty 0.4

## 2014-12-29 MED ORDER — OXYCODONE HCL 5 MG PO TABS
5.0000 mg | ORAL_TABLET | ORAL | Status: DC | PRN
  Administered 2014-12-30: 5 mg via ORAL
  Filled 2014-12-29: qty 1

## 2014-12-29 MED ORDER — SENNOSIDES-DOCUSATE SODIUM 8.6-50 MG PO TABS
2.0000 | ORAL_TABLET | Freq: Two times a day (BID) | ORAL | Status: DC
  Administered 2014-12-29 – 2014-12-30 (×3): 2 via ORAL
  Filled 2014-12-29 (×3): qty 2

## 2014-12-29 MED ORDER — POLYETHYLENE GLYCOL 3350 17 G PO PACK
17.0000 g | PACK | Freq: Every day | ORAL | Status: DC
  Administered 2014-12-29 – 2014-12-30 (×2): 17 g via ORAL
  Filled 2014-12-29 (×2): qty 1

## 2014-12-29 NOTE — Progress Notes (Signed)
Patient Demographics  Peggy [name], is a 29 y.o. female, DOB - 21-May-1985, ZOX:[id number]  Admit date - 12/25/2014   Admitting Physician Albertine Grates, MD  Outpatient Primary MD for the patient is Pershing Proud  LOS - 4   Chief Complaint  Patient presents with  . Emesis         Subjective:   [name] feeling better today, tolerating soft diet, denies pain, no n/v, no obvious jaundice, ambulating without difficulty, fiance in the room.     Assessment & Plan    Active Problems:   Choledocholithiasis   Elevated LFTs  Choledocholithiasis - With significantly elevated LFTs, CT abdomen and pelvis with common bile duct dilatation with evidence of stone in distal bile duct on admission. - s/p ERCP 9/18 by Dr. Madilyn Fireman,  with multiple gallstones drained. -  patient underwent laparoscopic cholecystectomy by Dr. Andrey Campanile on 12/27/2014. - LFTs trending down - hgb stable post op on anticaogulation.  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 For now patient can stay on full lovenox dose or NOAC. i have discussed with the patient, she prefer to stay on lovenox, explained to her again, that she should be on full therapeutic dose lovenox. Asked patient to bring in home lovenox vials to verify home dose. Patient states will asked her father to bring in today. Possible medication noncomplaints.  Alcohol use - on CIWA   Code  Status: Full  Family Communication: fiance at bedside  Disposition Plan: Home when hgb stable once resume full dose of lovenox, likely tomorrow   Procedures  ERCP 9/18 2016 by Dr. Madilyn Fireman Laparoscopic cholecystectomy 9/19 by Dr. Andrey Campanile  Consults   GI General surgery hematology   Medications  Scheduled Meds: . enoxaparin (LOVENOX) injection  40 mg Subcutaneous Q24H  . Influenza vac split quadrivalent PF  0.5 mL Intramuscular Tomorrow-1000  . pantoprazole  40 mg Oral Q supper  . polyethylene glycol  17 g Oral Daily  . senna-docusate  2 tablet Oral BID  . thiamine  100 mg Oral Daily   Or  . thiamine  100 mg Intravenous Daily   Continuous Infusions:   PRN Meds:.ibuprofen, oxyCODONE, simethicone  DVT Prophylaxis  on heparin drip  Lab Results  Component Value Date   PLT 408* 12/29/2014    Antibiotics    Anti-infectives    Start     Dose/Rate Route Frequency Ordered Stop   12/27/14 1000  [MAR Hold]  cefTRIAXone (ROCEPHIN) 2 g in dextrose 5 % 50 mL IVPB     (MAR Hold since 12/27/14 0838)   2 g 100 mL/hr over 30 Minutes Intravenous To Surgery 12/26/14 1723 12/27/14 0945   12/26/14 1200  ciprofloxacin (CIPRO) IVPB 400 mg  Status:  Discontinued     400 mg 200 mL/hr over  60 Minutes Intravenous Every 12 hours 12/26/14 1040 12/26/14 1306          Objective:   Filed Vitals:   12/28/14 0451 12/28/14 2021 12/29/14 0433 12/29/14 0813  BP: 97/56 119/78 103/76 108/73  Pulse: 62 60 53 58  Temp: 98.2 F (36.8 C) 97.5 F (36.4 C) 97.7 F (36.5 C) 97.9 F (36.6 C)  TempSrc: Oral Oral Oral Oral  Resp: 16 17  17   Height:      Weight:  144 lb 10 oz (65.6 kg)    SpO2: 98% 99% 99% 98%    Wt Readings from Last 3 Encounters:  12/28/14 144 lb 10 oz (65.6 kg)  11/04/14 142 lb (64.411 kg)  09/14/14 142 lb (64.411 kg)     Intake/Output SumLamount C>[id number] hours) at 12/29/14 1059 Last data filed at 09/21/16ConstellatiErnestene KielE Gallbladder and biliary tree: There is a peripherally calcified at 12 mm stone in the distal common bile duct. This leads to intra and extrahepatic bile duct dilation. Common bile duct measures 11 mm in the pancreatic head and 14 mm in the porta hepatis. There is diffuse gallbladder  wall thickening. No stone is seen within the gallbladder. There is also dilation of the pancreatic duct measuring 5 mm.  Liver: Normal in size and morphology. No mass or focal lesion. Intrahepatic duct dilation as described above.  Spleen: Unremarkable.  Pancreas: No mass or inflammatory changes. Duct dilation as detailed above.  Adrenal glands: No masses.  Kidneys, ureters, bladder:  Normal.  Uterus and adnexa: Uterus unremarkable. Apparent 2 cm paraovarian cyst. Ovaries unremarkable. Trace pelvic free fluid.  Lymph nodes:  No adenopathy.  Gastrointestinal: Unremarkable. Appendix not visualized. No  evidence of appendicitis.  Musculoskeletal:  Unremarkable.  IMPRESSION: 1. 12 mm stone lies in the distal common bile duct causing significant intern extrahepatic bile duct dilation as well as diffuse gallbladder wall thickening/edema. In addition, stone leads to milder dilation of the pancreatic duct. 2. No radiopaque stones are seen within the gallbladder. 3. No other acute finding.   Electronically Signed   By: Amie Portland M.D.   On: 12/25/2014 19:30   Dg Ercp Biliary & Pancreatic Ducts  12/26/2014   CLINICAL DATA:  Fluoroscopic images from ERCP.  EXAM: ERCP performed by Dr. Levada Schilling.  TECHNIQUE: Multiple spot images obtained with the fluoroscopic device and submitted for interpretation post-procedure.  FLUOROSCOPY TIME:  Number of Acquired Images:  3  COMPARISON:  None.  FINDINGS: Three fluoroscopic images from ERCP demonstrate endoscope overlying the second portion of the duodenum. Contrast opacifies dilated common bile duct. 3 radiklucent filling defects are seen, probably representing stones within the proximal extrahepatic common bile duct.  IMPRESSION: Intra procedural fluoroscopic images from common bile duct stone extraction.  These images were submitted for radiologic interpretation only. Please see the procedural report for the amount of contrast and the fluoroscopy time utilized.   Electronically  Signed   By: Ted Mcalpine M.D.   On: 12/26/2014 13:08     CBC  Recent Labs Lab 12/25/14 1531 12/26/14 0541 12/27/14 0006 12/28/14 0111 12/29/14 0415  WBC 9.3 6.5 7.2 9.7 7.6  HGB 12.9 11.9* 10.6* 10.7* 10.3*  HCT 38.6 35.4* 32.2* 32.0* 31.5*  PLT 319 285 303 361 408*  MCV 90.2 90.8 90.7 90.1 92.4  MCH 30.1 30.5 29.9 30.1 30.2  MCHC 33.4 33.6 32.9 33.4 32.7  RDW 14.3 14.4 14.5 14.8 15.3    Chemistries   Recent Labs Lab 12/25/14 1531 12/26/14 0541 12/27/14 0007 12/28/14 0111 12/29/14 0845  NA 135 137 138 137  --   K 4.0 4.5 4.1 4.1  --   CL 102 106 104 104  --   CO2 --   GLUCOSE 94 99 99 158*  --   BUN <5*  --   CREATININE 0.83 0.74 0.70 0.70  --   CALCIUM 9.3 8.7* 8.8* 8.8*  --   AST 454* 389* 223* 97* 54*  ALT 546* 506* 413* 278* 181*  ALKPHOS 1332* 1208* 1063* 845* 641*  BILITOT 15.1* 12.8* 6.2* 3.4* 2.9*   ------------------------------------------------------------------------------------------------------------------ estimated creatinine clearance is 108.4 mL/min (by C-G formula based on Cr of 0.7). ------------------------------------------------------------------------------------------------------------------ No results for input(s): HGBA1C in the last 72 hours. ------------------------------------------------------------------------------------------------------------------ No results for input(s): CHOL, HDL, LDLCALC, TRIG, CHOLHDL, LDLDIRECT in the last 72 hours. ------------------------------------------------------------------------------------------------------------------ No results for input(s): TSH, T4TOTAL, T3FREE, THYROIDAB in the last 72 hours.  Invalid input(s): FREET3 ------------------------------------------------------------------------------------------------------------------ No results for input(s): VITAMINB12, FOLATE, FERRITIN, TIBC, IRON, RETICCTPCT in the last 72 hours.  Coagulation profile  Recent  Labs Lab 12/25/14 2035  INR 1.16    No results for input(s): DDIMER in the last 72 hours.  Cardiac Enzymes No results for input(s): CKMB, TROPONINI, MYOGLOBIN in the last 168 hours.  Invalid input(s): CK ------------------------------------------------------------------------------------------------------------------ Invalid input(s): POCBNP     Time Spent in minutes   35 MINUTES, more than 50% time spent on coordination of care   Xu,Fang MD PhD on 12/29/2014 at 10:59 AM  Between 7am to 7pm - Pager - [phone]  After 7pm go to [email or web address] - password [facility]  [facility]   Office  [phone]

## 2014-12-29 NOTE — Progress Notes (Signed)
[facility] Progress Note  2 Days Post-Op  Subjective: 29 y/o female POD#2 after lap chole. Pt is not in much pain. Pain is limited to incision sites and worse with movement. Denies N/V. Tolerating full diet. Having flatus. No BM yet.   Objective: Vital signs in last 24 hours: Temp:  [97.5 F (36.4 C)-97.7 F (36.5 C)] 97.7 F (36.5 C) (09/21 0433) Pulse Rate:  [53-60] 53 (09/21 0433) Resp:  [17] 17 (09/20 2021) BP: (103-119)/(76-78) 103/76 mmHg (09/21 0433) SpO2:  [99 %] 99 % (09/21 0433) Weight:  [65.6 kg (144 lb 10 oz)] 65.6 kg (144 lb 10 oz) (09/20 2021) Last BM Date: 12/26/14  Intake/Output from previous day: 09/20 0701 - 09/21 0700 In: 2692.5 [P.O.:960; I.V.:1732.5] Out: 0  Intake/Output this shift:   PE: Gen:  Alert, NAD, pleasant Card:  RRR, no M/G/R heard Pulm:  CTA, no W/R/R Abd: Soft, mildly tender, ND, +BS, no HSM, incisions C/D/I, no bleeding or bruising around incision sites. Ext:  No erythema, edema  Lab Results:   Recent Labs  12/28/14 0111 12/29/14 0415  WBC 9.7 7.6  HGB 10.7* 10.3*  HCT 32.0* 31.5*  PLT 361 408*   BMET  Recent Labs  12/27/14 0007 12/28/14 0111  NA 138 137  K 4.1 4.1  CL 104 104  CO2 27 26  GLUCOSE 99 158*  BUN 8 <5*  CREATININE 0.70 0.70  CALCIUM 8.8* 8.8*   PT/INR No results for input(s): LABPROT, INR in the last 72 hours. CMP     Component Value Date/Time   NA 137 12/28/2014 0111   K 4.1 12/28/2014 0111   CL 104 12/28/2014 0111   CO2 26 12/28/2014 0111   GLUCOSE 158* 12/28/2014 0111   BUN <5* 12/28/2014 0111   CREATININE 0.70 12/28/2014 0111   CALCIUM 8.8* 12/28/2014 0111   PROT 5.3* 12/28/2014 0111   ALBUMIN 2.4* 12/28/2014 0111   AST 97* 12/28/2014 0111   ALT 278* 12/28/2014 0111   ALKPHOS 845* 12/28/2014 0111   BILITOT 3.4* 12/28/2014 0111   GFRNONAA >60 12/28/2014 0111   GFRAA >60 12/28/2014 0111   Lipase     Component Value Date/Time   LIPASE 58* 12/27/2014 0006     Studies/Results: No results found.  Anti-infectives: Anti-infectives    Start     Dose/Rate Route Frequency Ordered Stop   12/27/14 1000  [MAR Hold]  cefTRIAXone (ROCEPHIN) 2 g in dextrose 5 % 50 mL IVPB     (MAR Hold since 12/27/14 0838)   2 g 100 mL/hr over 30 Minutes Intravenous To Surgery 12/26/14 1723 12/27/14 0945   12/26/14 1200  ciprofloxacin (CIPRO) IVPB 400 mg  Status:  Discontinued     400 mg 200 mL/hr over 60 Minutes Intravenous Every 12 hours 12/26/14 1040 12/26/14 1306      Assessment/Plan s/p Procedure(s): LAPAROSCOPIC CHOLECYSTECTOMY (N/A) - Hgb/Hct stable, no signs of bleeding - LFT's ordered, confirm that they are trending down prior to discharge.   - Discussed discharge instructions with pt including caring for incision sites, showering, stool softeners for possible constipation, work note, lifting restrictions, and outpatient follow-up . Pt states her fiance will be taking her home at discharge.  h/o factor V leiden deficiency - No signs of bleeding at incision sites - Anticoagulation per medicine   - Thus far, providers have been unable to confirm the pts diagnosis, there is also concern for poor patient compliance with lovenox at home. Medicine and Pharmacy following this.  LOS: 4 days    [name] 12/29/2014, 8:01 AM Pager: [phone]

## 2014-12-29 NOTE — Progress Notes (Signed)
Received order for PT Hydrotherapy this morning before 8am. Left message with PA that placed order and have yet to receive a callback. Will leave another message and continuing waiting for callback.   Leanna Battles, RN.

## 2014-12-29 NOTE — Progress Notes (Signed)
PT Hydrotherapy Cancellation Note  Patient Details Name: Peggy [name] MRN: [id number] DOB: 19-Apr-1985   Cancelled Treatment:    Reason Eval/Treat Not Completed: Other (comment) (Awaiting further orders) PT Hydrotherapy order acknowledged. No specific instructions found for location or indication of hydrotherapy. Order placed by Jorje Guild, PA-C; cannot locate any contact info in Amion or in notes to page this provider. Will hold until further information given for hydrotherapy.  Berton Mount 12/29/2014, 1:50 PM Charlsie Merles, [place] [phone]

## 2014-12-30 LAB — CBC
HEMATOCRIT: 35.2 % — AB (ref 36.0–46.0)
HEMOGLOBIN: 11.5 g/dL — AB (ref 12.0–15.0)
MCH: 30.4 pg (ref 26.0–34.0)
MCHC: 32.7 g/dL (ref 30.0–36.0)
MCV: 93.1 fL (ref 78.0–100.0)
Platelets: 439 10*3/uL — ABNORMAL HIGH (ref 150–400)
RBC: 3.78 MIL/uL — ABNORMAL LOW (ref 3.87–5.11)
RDW: 15.2 % (ref 11.5–15.5)
WBC: 9.3 10*3/uL (ref 4.0–10.5)

## 2014-12-30 MED ORDER — ENOXAPARIN SODIUM 40 MG/0.4ML ~~LOC~~ SOLN: 40.0000 mg | Freq: Two times a day (BID) | SUBCUTANEOUS | Status: DC

## 2014-12-30 NOTE — Progress Notes (Signed)
Patient Discharge:  Disposition:  Pt discharged home to self  Education: Pt educated on medications, follow up appointments, hand out on diagnosis and all discharge instructions. Pt verbalized understanding.   IV: Removed  Telemetry: N/A  Follow-up appointments: Follow up appointments scheduled for pt with [facility] Surgery and Dr. Galen Manila (hematology and oncology). Reviewed with pt. Pt verbalized understanding.   Prescriptions: Scripts sent to pt's pharmacy  Transportation: Transported home by fiance  Belongings: All belongings taken with pt

## 2014-12-30 NOTE — Progress Notes (Signed)
[facility] Progress Note  3 Days Post-Op  Subjective: Pt seen and examined at bedside today. She denies abdominal pain and states she has not been needing pain meds. Tolerating PO with no N/V. Passing flatus. Ambulating well. No BM yet.  Objective: Vital signs in last 24 hours: Temp:  [98.2 F (36.8 C)-98.6 F (37 C)] 98.6 F (37 C) (09/22 0450) Pulse Rate:  [70-79] 79 (09/22 0450) Resp:  [16-18] 18 (09/22 0450) BP: (113-125)/(84-91) 113/84 mmHg (09/22 0450) SpO2:  [97 %-100 %] 97 % (09/22 0450) Last BM Date: 12/26/14  Intake/Output from previous day: 09/21 0701 - 09/22 0700 In: 1787.5 [P.O.:1420; I.V.:367.5] Out: 400 [Urine:400] Intake/Output this shift:    PE: Gen:  Alert, NAD, pleasant Abd: Soft, NT/ND, +BS, no HSM, incisions C/D/I with no ecchymosis or bleeding Ext:  No erythema, edema, or tenderness   Lab Results:   Recent Labs  12/29/14 0415 12/30/14 0444  WBC 7.6 9.3  HGB 10.3* 11.5*  HCT 31.5* 35.2*  PLT 408* 439*   BMET  Recent Labs  12/28/14 0111  NA 137  K 4.1  CL 104  CO2 26  GLUCOSE 158*  BUN <5*  CREATININE 0.70  CALCIUM 8.8*   PT/INR No results for input(s): LABPROT, INR in the last 72 hours. CMP     Component Value Date/Time   NA 137 12/28/2014 0111   K 4.1 12/28/2014 0111   CL 104 12/28/2014 0111   CO2 26 12/28/2014 0111   GLUCOSE 158* 12/28/2014 0111   BUN <5* 12/28/2014 0111   CREATININE 0.70 12/28/2014 0111   CALCIUM 8.8* 12/28/2014 0111   PROT 5.0* 12/29/2014 0845   ALBUMIN 2.4* 12/29/2014 0845   AST 54* 12/29/2014 0845   ALT 181* 12/29/2014 0845   ALKPHOS 641* 12/29/2014 0845   BILITOT 2.9* 12/29/2014 0845   GFRNONAA >60 12/28/2014 0111   GFRAA >60 12/28/2014 0111   Lipase     Component Value Date/Time   LIPASE 58* 12/27/2014 0006    Studies/Results: No results found.  Anti-infectives: Anti-infectives    Start     Dose/Rate Route Frequency Ordered Stop   12/27/14 1000  [MAR Hold]  cefTRIAXone  (ROCEPHIN) 2 g in dextrose 5 % 50 mL IVPB     (MAR Hold since 12/27/14 0838)   2 g 100 mL/hr over 30 Minutes Intravenous To Surgery 12/26/14 1723 12/27/14 0945   12/26/14 1200  ciprofloxacin (CIPRO) IVPB 400 mg  Status:  Discontinued     400 mg 200 mL/hr over 60 Minutes Intravenous Every 12 hours 12/26/14 1040 12/26/14 1306      Assessment/Plan S/p laparoscopic cholecystectomy, POD#3 - vitals stable - Hgb/Hct stable, no signs of bleeding - LFT's trending down - follow-up and wound care discussed with pt - work note provided   H/o factor V leiden deficiency - anticoagulation per medicine, currently on Lovenox  Dispo: ready for discharge from an abdominal standpoint   LOS: 5 days    Peggy [name] 12/30/2014, 9:17 AM Pager: [phone]

## 2014-12-30 NOTE — Discharge Summary (Signed)
Discharge Summary  Peggy [name]:[id number] DOB: 12-01-1985  PCP: Pershing Proud  Admit date: 12/25/2014 Discharge date: 12/30/2014  Time spent: <29mins  Recommendations for Outpatient Follow-up:  1. F/u with hematology Dr. Loma Messing on on 01/14/2015 at 2pm, f/u on final factor v leiden and activated protein c resistance test results and discuss anticoagulation choices. 2. F/u with general surgery on 01/18/2015, s/p laparoscopic cholecystectomy   Discharge Diagnoses:  Active Hospital Problems   Diagnosis Date Noted  . Elevated LFTs 12/27/2014  . Choledocholithiasis 12/25/2014    Resolved Hospital Problems   Diagnosis Date Noted Date Resolved  No resolved problems to display.    Discharge Condition: stable  Diet recommendation: soft diet , advance diet as tolerated  Filed Weights   12/25/14 2150 12/27/14 2054 12/28/14 2021  Weight: 140 lb 9.6 oz (63.776 kg) 141 lb 1.5 oz (64 kg) 144 lb 10 oz (65.6 kg)    History of present illness:  Peggy [name] is a 29 y.o. Female with h/o factor V leiden , h/o PE when she was 21 , has been on chronic anticoagulation since then, presented to the ED due to n/v/ab pain with oral intake for a week, she reported that she was evaluated by her pmd in [place] was first diagnosed with stomach virus, then was told the elevated liver function was due to drinking alcohol, her symptom did not improve, she became visible jaundice.  in the ED here CT ab showed CBD stone, elevated lft with tbilib of 15. EDP talked to GI Dr. Retia Passe who recommended keep npo, ERCP in am.  Currently patient is vital stable, denies pain, no n/v, no fever, wanting to eat.  Hospital Course:  Active Problems:   Choledocholithiasis   Elevated LFTs  Choledocholithiasis - With significantly elevated LFTs, CT abdomen and pelvis with common bile duct dilatation with evidence of stone in distal bile duct on admission. - s/p ERCP 9/18 by Dr. Madilyn Fireman, with  multiple gallstones drained. - patient underwent laparoscopic cholecystectomy by Dr. Andrey Campanile on 12/27/2014. - LFTs trending down - hgb stable post op on anticaogulation.  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 For now patient can stay on full lovenox dose or NOAC. i have discussed with the patient, she prefer to stay on lovenox, explained to her again, that she should be on full therapeutic dose lovenox. Asked patient to bring in home lovenox vials to verify home dose. Patient states will asked her father to bring, but not able to bring in for multiple occasions. Possible medication noncomplaints. - discharge with lovenox injection bid and close follow up with hematology, appointment made for her.  Alcohol use - on CIWA , no significant withdrawal symptom in the hospital  Code Status: Full  Family Communication: patient  Disposition Plan: Home    Procedures  ERCP 9/18 2016 by Dr. Madilyn Fireman Laparoscopic cholecystectomy 9/19 by Dr. Ardell Isaacs  GI General surgery hematology    Discharge Exam: BP 113/84 mmHg  Pulse 79  Temp(Src) 98.6 F (37 C) (Oral)  Resp 18  Ht  (1.778 m)  Wt 144 lb 10 oz (65.6 kg)  BMI 20.75 kg/m2  SpO2 97%  LMP 12/06/2014  General: * Cardiovascular: * Respiratory: *  Discharge Instructions You were cared for by a hospitalist during your hospital stay. If you have any questions about your discharge medications or the care you received while you were in the  hospital after you are discharged, you can call the unit and asked to speak with the hospitalist on call if the hospitalist that took care of you is not available. Once you are discharged, your primary care physician will handle any further medical issues. Please note that NO REFILLS for any discharge medications will be authorized once you are discharged, as it is imperative that you return to your primary care physician (or establish a relationship with a primary care physician if you do not have one) for your aftercare needs so that they can reassess your need for medications and monitor your lab values.  Discharge Instructions    Diet - low sodium heart healthy    Complete by:  As directed      Increase activity slowly    Complete by:  As directed             Medication List    STOP taking these medications        ibuprofen 200 MG tablet  Commonly known as:  ADVIL,MOTRIN     ondansetron 8 MG tablet  Commonly known as:  ZOFRAN     oxyCODONE-acetaminophen 5-325 MG per tablet  Commonly known as:  PERCOCET/ROXICET     promethazine 25 MG tablet  Commonly known as:  PHENERGAN     traMADol 50 MG tablet  Commonly known as:  ULTRAM      TAKE these medications        enoxaparin 40 MG/0.4ML injection  Commonly known as:  LOVENOX  Inject 0.4 mLs (40 mg total) into the skin every 12 (twelve) hours.       Allergies  Allergen Reactions  . Caffeine Anaphylaxis  . Peanut Butter Flavor Anaphylaxis       Follow-up Information    Follow up with [facility] On 01/18/2015.   Specialty:  General Surgery   Why:  [facility], 1:30pm, arrive no later than 1:00pm for paperwork   Contact information:   [address] [place] [address] [phone]        The results of significant diagnostics from this hospitalization (including imaging, microbiology, ancillary and laboratory) are listed below for reference.    Significant Diagnostic  Studies: Ct Abdomen Pelvis W Contrast  12/25/2014   CLINICAL DATA:  29 yr old female with LUQ pain, extreme nausea, vomiting, and worsening jaundice. Patient states she recently was diagnosised with Liver disease but not told what or given any treatement.  EXAM: CT ABDOMEN AND PELVIS WITH CONTRAST  TECHNIQUE: Multidetector CT imaging of the abdomen and pelvis was performed using the standard protocol following bolus administration of intravenous contrast.  CONTRAST:  OMNIPAQUE IOHEXOL 300 MG/ML  SOLN  COMPARISON:  None.  FINDINGS: Lung bases:  Clear.  Heart size.  Gallbladder and biliary tree: There is a peripherally calcified at 12 mm stone in the distal common bile duct. This leads to intra and extrahepatic bile duct dilation. Common bile duct measures 11 mm in the pancreatic head and 14 mm in the porta hepatis. There is diffuse gallbladder wall thickening.  No stone is seen within the gallbladder. There is also dilation of the pancreatic duct measuring 5 mm.  Liver: Normal in size and morphology. No mass or focal lesion. Intrahepatic duct dilation as described above.  Spleen: Unremarkable.  Pancreas: No mass or inflammatory changes. Duct dilation as detailed above.  Adrenal glands: No masses.  Kidneys, ureters, bladder:  Normal.  Uterus and adnexa: Uterus unremarkable. Apparent 2 cm paraovarian cyst. Ovaries unremarkable. Trace pelvic free fluid.  Lymph nodes:  No adenopathy.  Gastrointestinal: Unremarkable. Appendix not visualized. No evidence of appendicitis.  Musculoskeletal:  Unremarkable.  IMPRESSION: 1. 12 mm stone lies in the distal common bile duct causing significant intern extrahepatic bile duct dilation as well as diffuse gallbladder wall thickening/edema. In addition, stone leads to milder dilation of the pancreatic duct. 2. No radiopaque stones are seen within the gallbladder. 3. No other acute finding.   Electronically Signed   By: Amie Portland M.D.   On: 12/25/2014 19:30   Dg Ercp Biliary  & Pancreatic Ducts  12/26/2014   CLINICAL DATA:  Fluoroscopic images from ERCP.  EXAM: ERCP performed by Dr. Levada Schilling.  TECHNIQUE: Multiple spot images obtained with the fluoroscopic device and submitted for interpretation post-procedure.  FLUOROSCOPY TIME:  Number of Acquired Images:  3  COMPARISON:  None.  FINDINGS: Three fluoroscopic images from ERCP demonstrate endoscope overlying the second portion of the duodenum. Contrast opacifies dilated common bile duct. 3 radiklucent filling defects are seen, probably representing stones within the proximal extrahepatic common bile duct.  IMPRESSION: Intra procedural fluoroscopic images from common bile duct stone extraction.  These images were submitted for radiologic interpretation only. Please see the procedural report for the amount of contrast and the fluoroscopy time utilized.   Electronically Signed   By: Ted Mcalpine M.D.   On: 12/26/2014 13:08    Microbiology: Recent Results (from the past 240 hour(s))  Surgical pcr screen     Status: None   Collection Time: 12/26/14  8:57 AM  Result Value Ref Range Status   MRSA, PCR NEGATIVE NEGATIVE Final   Staphylococcus aureus NEGATIVE NEGATIVE Final    Comment:        The Xpert SA Assay (FDA approved for NASAL specimens in patients over 83 years of age), is one component of a comprehensive surveillance program.  Test performance has been validated by [facility] for patients greater than or equal to 20 year old. It is not intended to diagnose infection nor to guide or monitor treatment.      Labs: Basic Metabolic Panel:  Recent Labs Lab 12/25/14 1531 12/26/14 0541 12/27/14 0007 12/28/14 0111  NA 135 137 138 137  K 4.0 4.5 4.1 4.1  CL 102 106 104 104  CO2 GLUCOSE 94 99 99 158*  BUN <5*  CREATININE 0.83 0.74 0.70 0.70  CALCIUM 9.3 8.7* 8.8* 8.8*   Liver Function Tests:  Recent Labs Lab 12/25/14 1531 12/26/14 0541 12/27/14 0007 12/28/14 0111  12/29/14 0845  AST 454* 389* 223* 97* 54*  ALT 546* 506* 413* 278* 181*  ALKPHOS 1332* 1208* 1063* 845* 641*  BILITOT 15.1* 12.8* 6.2* 3.4* 2.9*  PROT 6.6 5.7* 5.4* 5.3* 5.0*  ALBUMIN 3.0* 2.6* 2.6* 2.4* 2.4*    Recent Labs Lab 12/25/14 1755 12/27/14 0006  LIPASE 282* 58*   No results for input(s): AMMONIA in the last 168 hours. CBC:  Recent Labs Lab 12/26/14 0541 12/27/14 0006 12/28/14 0111 12/29/14 0415  12/30/14 0444  WBC 6.5 7.2 9.7 7.6 9.3  HGB 11.9* 10.6* 10.7* 10.3* 11.5*  HCT 35.4* 32.2* 32.0* 31.5* 35.2*  MCV 90.8 90.7 90.1 92.4 93.1  PLT 285 303 361 408* 439*   Cardiac Enzymes: No results for input(s): CKTOTAL, CKMB, CKMBINDEX, TROPONINI in the last 168 hours. BNP: BNP (last 3 results)  Recent Labs  09/14/14 2250  BNP 28.9    ProBNP (last 3 results) No results for input(s): PROBNP in the last 8760 hours.  CBG:  Recent Labs Lab 12/26/14 1253  GLUCAP 72       Signed:  Xu,Fang MD, PhD  [facility] 12/30/2014, 10:01 AM

## 2014-12-30 NOTE — Care Management Note (Signed)
Case Management Note  Patient Details  Name: Peggy [name] MRN: [id number] Date of Birth: 1985-07-09  Subjective/Objective:       CM following for progression and d/c planning.             Action/Plan: No HH or DME needs identified.   Expected Discharge Date:       12/30/2014           Expected Discharge Plan:  Home/Self Care  In-House Referral:  NA  Discharge planning Services  NA  Post Acute Care Choice:  NA Choice offered to:  NA  DME Arranged:  N/A DME Agency:  NA  HH Arranged:  NA HH Agency:  NA  Status of Service:  Completed, signed off  Medicare Important Message Given:    Date Medicare IM Given:    Medicare IM give by:    Date Additional Medicare IM Given:    Additional Medicare Important Message give by:     If discussed at Long Length of Stay Meetings, dates discussed:    Additional Comments:  Starlyn Skeans, RN 12/30/2014, 2:12 PM

## 2015-01-03 LAB — APC RESISTANCE: Activated Protein C Resistance: 5.2 ratio (ref >=2.1)

## 2015-01-04 LAB — FACTOR 5 LEIDEN

## 2015-01-14 ENCOUNTER — Ambulatory Visit (HOSPITAL_COMMUNITY): Payer: Medicaid Other | Admitting: Hematology & Oncology

## 2015-01-14 NOTE — Progress Notes (Signed)
This encounter was created in error - please disregard.

## 2015-03-22 ENCOUNTER — Inpatient Hospital Stay (HOSPITAL_COMMUNITY)
Admission: AD | Admit: 2015-03-22 | Discharge: 2015-03-22 | Disposition: A | Discharge status: Home / Self Care | Payer: Medicaid Other | Source: Ambulatory Visit | Attending: Obstetrics and Gynecology | Admitting: Obstetrics and Gynecology

## 2015-03-22 ENCOUNTER — Inpatient Hospital Stay (HOSPITAL_COMMUNITY): Payer: Medicaid Other

## 2015-03-22 ENCOUNTER — Encounter (HOSPITAL_COMMUNITY): Payer: Self-pay | Admitting: *Deleted

## 2015-03-22 DIAGNOSIS — O99331 Smoking (tobacco) complicating pregnancy, first trimester: Secondary | ICD-10-CM | POA: Diagnosis not present

## 2015-03-22 DIAGNOSIS — Z3A11 11 weeks gestation of pregnancy: Secondary | ICD-10-CM | POA: Insufficient documentation

## 2015-03-22 DIAGNOSIS — R112 Nausea with vomiting, unspecified: Secondary | ICD-10-CM | POA: Diagnosis not present

## 2015-03-22 DIAGNOSIS — R1032 Left lower quadrant pain: Secondary | ICD-10-CM | POA: Insufficient documentation

## 2015-03-22 DIAGNOSIS — Z86711 Personal history of pulmonary embolism: Secondary | ICD-10-CM | POA: Insufficient documentation

## 2015-03-22 DIAGNOSIS — O26899 Other specified pregnancy related conditions, unspecified trimester: Secondary | ICD-10-CM

## 2015-03-22 DIAGNOSIS — O26891 Other specified pregnancy related conditions, first trimester: Secondary | ICD-10-CM | POA: Diagnosis not present

## 2015-03-22 DIAGNOSIS — R109 Unspecified abdominal pain: Secondary | ICD-10-CM

## 2015-03-22 DIAGNOSIS — Z7901 Long term (current) use of anticoagulants: Secondary | ICD-10-CM | POA: Diagnosis not present

## 2015-03-22 DIAGNOSIS — O9989 Other specified diseases and conditions complicating pregnancy, childbirth and the puerperium: Secondary | ICD-10-CM

## 2015-03-22 DIAGNOSIS — F17290 Nicotine dependence, other tobacco product, uncomplicated: Secondary | ICD-10-CM | POA: Diagnosis not present

## 2015-03-22 LAB — URINE MICROSCOPIC-ADD ON: RBC / HPF: NONE SEEN RBC/hpf (ref 0–5)

## 2015-03-22 LAB — POCT PREGNANCY, URINE: Preg Test, Ur: POSITIVE — AB

## 2015-03-22 LAB — URINALYSIS, ROUTINE W REFLEX MICROSCOPIC
BILIRUBIN URINE: NEGATIVE
GLUCOSE, UA: 250 mg/dL — AB
Hgb urine dipstick: NEGATIVE
KETONES UR: NEGATIVE mg/dL
NITRITE: NEGATIVE
PH: 6 (ref 5.0–8.0)
Protein, ur: NEGATIVE mg/dL
SPECIFIC GRAVITY, URINE: 1.025 (ref 1.005–1.030)

## 2015-03-22 MED ORDER — PROMETHAZINE HCL 25 MG PO TABS: 25.0000 mg | ORAL_TABLET | Freq: Four times a day (QID) | ORAL | Status: DC | PRN

## 2015-03-22 NOTE — Discharge Instructions (Signed)

## 2015-03-22 NOTE — MAU Note (Addendum)
For about a wk have had pain LLQ.Crampy like that comes and goes. Last night when pain came I got dizzy. This morning when the pain comes I got dizzy and threw up. I have thrown up 4 times today. I did eat bkf and lunch. LMP 11/30/14. Had gallbladder removed 9/18 and as far as i know was not pregnant then

## 2015-03-22 NOTE — MAU Provider Note (Signed)
History     CSN: [id number]  Arrival date and time: 03/22/15 1422   First Provider Initiated Contact with Patient 03/22/15 1459      Chief Complaint  Patient presents with  . Abdominal Pain  . Dizziness  . Emesis During Pregnancy   HPI  Peggy [name] 29 y.o. [id number] [redacted]w[redacted]d presents to the MAU today with the complaint of sporadic pain in LLQ that is so painful she becomes dizzy and throws up. She has been taking birth control this entire pregnancy. She only found out she was pregnant today. She did realize she had not had a period since August but thought it was due to her surgery she had done.She takes Lovenox.  Past Medical History  Diagnosis Date  . LGSIL (low grade squamous intraepithelial dysplasia) 12-07 &3-08    C&B 3-08/CONDYLOMA TREATED 06-19-06  . Complete miscarriage 09-12-06  . ADD (attention deficit disorder)   . Factor V deficiency (HCC)   . Pulmonary embolus (HCC)     age 39  . Ovarian cyst     Past Surgical History  Procedure Laterality Date  . Incise and drain abcess  11-16-09    LEFT LABIA  . Augmentation mammaplasty  08/2009  . Tonsillectomy    . Wisdom tooth extraction    . Ercp N/A 12/26/2014    Procedure: ENDOSCOPIC RETROGRADE CHOLANGIOPANCREATOGRAPHY (ERCP);  Surgeon: Dorena Cookey, MD;  Location: [facility];  Service: Gastroenterology;  Laterality: N/A;  . Cholecystectomy N/A 12/27/2014    Procedure: LAPAROSCOPIC CHOLECYSTECTOMY ;  Surgeon: Gaynelle Adu, MD;  Location[facility] OR;  Service: General;  Laterality: N/A;    Family History  Problem Relation Age of Onset  . Cancer Maternal Grandmother     skin  . Diabetes Maternal Grandmother   . Hypertension Maternal Grandfather   . Breast cancer Paternal Grandmother   . Cancer Paternal Grandfather     ? colon    Social History  Substance Use Topics  . Smoking status: Current Some Day Smoker  . Smokeless tobacco: Never Used  . Alcohol Use: Yes     Comment: once a week    Allergies:  Allergies   Allergen Reactions  . Caffeine Anaphylaxis  . Peanut Butter Flavor Anaphylaxis    Prescriptions prior to admission  Medication Sig Dispense Refill Last Dose  . drospirenone-ethinyl estradiol (YAZ,GIANVI,LORYNA) 3-0.02 MG tablet Take 1 tablet by mouth daily.   03/21/2015 at Unknown time  . enoxaparin (LOVENOX) 40 MG/0.4ML injection Inject 0.4 mLs (40 mg total) into the skin every 12 (twelve) hours. 60 Syringe 3 03/22/2015 at Unknown time    Review of Systems  Gastrointestinal: Positive for nausea, vomiting and abdominal pain.  Neurological: Positive for dizziness.  All other systems reviewed and are negative.  Physical Exam   Blood pressure 122/71, pulse 79, temperature 97.9 F (36.6 C), resp. rate 18, height  (1.753 m), weight 69.219 kg (152 lb 9.6 oz), last menstrual period 12/31/2014, unknown if currently breastfeeding.  Physical Exam  Nursing note and vitals reviewed. Constitutional: She is oriented to person, place, and time. She appears well-developed and well-nourished. No distress.  HENT:  Head: Normocephalic and atraumatic.  Neck: Normal range of motion.  Cardiovascular: Normal rate.   Respiratory: Effort normal. No respiratory distress.  GI: Soft. There is no tenderness.  Neurological: She is alert and oriented to person, place, and time.  Skin: Skin is warm and dry.  Psychiatric: She has a normal mood and affect. Her behavior is normal.  Judgment and thought content normal.   US Ob Comp Less 14 Wks  03/22/2015  CLINICAL DATA:  Abdominal/pelvic pain EXAM: OBSTETRIC <14 WK [place] AND TRANSVAGINAL OB US TECHNIQUE: Both transabdominal and transvaginal ultrasound examinations were performed for complete evaluation of the gestation as well as the maternal uterus, adnexal regions, and pelvic cul-de-sac. Transvaginal technique was performed to assess early pregnancy. COMPARISON:  None. FINDINGS: Intrauterine gestational sac: Visualized/normal in shape. Yolk sac:  Visualized  Embryo:  Visualized Cardiac Activity: Visualized Heart Rate: 171  bpm CRL:  45  mm   11 w   2 d                  [place] EDC: October 09, 2015 Maternal uterus/adnexae: There is a focal subchorionic hemorrhage measuring 3.2 x 1.5 x 2.5 cm. Cervical os is closed. There is a corpus luteum in the left ovary measuring approximately 1.5 cm. No other extrauterine pelvic or adnexal mass. No free pelvic fluid. IMPRESSION: Single live intrauterine gestation with estimated gestational age of 11+ weeks. Subchorionic hemorrhage noted measuring 3.2 x 1.5 x 2.5 cm. Electronically Signed   By: Bretta Bang III M.D.   On: 03/22/2015 15:53   US Ob Transvaginal  03/22/2015  CLINICAL DATA:  Abdominal/pelvic pain EXAM: OBSTETRIC <14 WK [place] AND TRANSVAGINAL OB US TECHNIQUE: Both transabdominal and transvaginal ultrasound examinations were performed for complete evaluation of the gestation as well as the maternal uterus, adnexal regions, and pelvic cul-de-sac. Transvaginal technique was performed to assess early pregnancy. COMPARISON:  None. FINDINGS: Intrauterine gestational sac: Visualized/normal in shape. Yolk sac:  Visualized Embryo:  Visualized Cardiac Activity: Visualized Heart Rate: 171  bpm CRL:  45  mm   11 w   2 d                  [place] EDC: October 09, 2015 Maternal uterus/adnexae: There is a focal subchorionic hemorrhage measuring 3.2 x 1.5 x 2.5 cm. Cervical os is closed. There is a corpus luteum in the left ovary measuring approximately 1.5 cm. No other extrauterine pelvic or adnexal mass. No free pelvic fluid. IMPRESSION: Single live intrauterine gestation with estimated gestational age of 11+ weeks. Subchorionic hemorrhage noted measuring 3.2 x 1.5 x 2.5 cm. Electronically Signed   By: Bretta Bang III M.D.   On: 03/22/2015 15:53   Results for orders placed or performed during the hospital encounter of 03/22/15 (from the past 24 hour(s))  Urinalysis, Routine w reflex microscopic (not at [facility])     Status: Abnormal    Collection Time: 03/22/15  2:30 PM  Result Value Ref Range   Color, Urine YELLOW YELLOW   APPearance CLEAR CLEAR   Specific Gravity, Urine 1.025 1.005 - 1.030   pH 6.0 5.0 - 8.0   Glucose, UA 250 (A) NEGATIVE mg/dL   Hgb urine dipstick NEGATIVE NEGATIVE   Bilirubin Urine NEGATIVE NEGATIVE   Ketones, ur NEGATIVE NEGATIVE mg/dL   Protein, ur NEGATIVE NEGATIVE mg/dL   Nitrite NEGATIVE NEGATIVE   Leukocytes, UA MODERATE (A) NEGATIVE  Urine microscopic-add on     Status: Abnormal   Collection Time: 03/22/15  2:30 PM  Result Value Ref Range   Squamous Epithelial / LPF 0-5 (A) NONE SEEN   WBC, UA 0-5 0 - 5 WBC/hpf   RBC / HPF NONE SEEN 0 - 5 RBC/hpf   Bacteria, UA RARE (A) NONE SEEN   Urine-Other MUCOUS PRESENT   Pregnancy, urine POC     Status: Abnormal  Collection Time: 03/22/15  2:37 PM  Result Value Ref Range   Preg Test, Ur POSITIVE (A) NEGATIVE   MAU Course  Procedures  MDM Ultrasound- Positive Heart tones  Assessment and Plan  Abdominal pain in pregnancy Phenergan 25mg  PO #30 Discharge Follow up next week with Dr. Kristopher GleeHorvath  Clemmons,[facility] 03/22/2015, 4:03 PM

## 2015-03-22 NOTE — Progress Notes (Signed)
Lori Clemmons CNM in to discuss test results and d/c plan. Written and verbal d/c instructions given and understanding voiced. 

## 2015-04-10 NOTE — L&D Delivery Note (Addendum)
Patient was C/C/+5 and pushed for 2 minutes with epidural.   NSVD  female infant, Apgars 8,9, weight P.   The patient had a small first degree R labial and perineal lacerations repaired with 3-0 and 2-0 vicryl Rs respectively. Fundus was firm. EBL was expected amount. Placenta was delivered intact. Vagina was clear.  Baby was vigorous and doing skin to skin with mother.  Parents desire circumcision in the hospital.  Xian Alves A

## 2015-04-28 LAB — OB RESULTS CONSOLE GC/CHLAMYDIA
Chlamydia: NEGATIVE
Gonorrhea: NEGATIVE

## 2015-08-31 ENCOUNTER — Inpatient Hospital Stay (HOSPITAL_COMMUNITY)
Admission: AD | Admit: 2015-08-31 | Discharge: 2015-09-01 | Disposition: A | Discharge status: Home / Self Care | Payer: Medicaid Other | Source: Ambulatory Visit | Attending: Obstetrics and Gynecology | Admitting: Obstetrics and Gynecology

## 2015-08-31 ENCOUNTER — Encounter (HOSPITAL_COMMUNITY): Payer: Self-pay | Admitting: *Deleted

## 2015-08-31 DIAGNOSIS — O4693 Antepartum hemorrhage, unspecified, third trimester: Secondary | ICD-10-CM | POA: Diagnosis not present

## 2015-08-31 DIAGNOSIS — R109 Unspecified abdominal pain: Secondary | ICD-10-CM | POA: Diagnosis present

## 2015-08-31 DIAGNOSIS — F988 Other specified behavioral and emotional disorders with onset usually occurring in childhood and adolescence: Secondary | ICD-10-CM | POA: Diagnosis not present

## 2015-08-31 DIAGNOSIS — Z86711 Personal history of pulmonary embolism: Secondary | ICD-10-CM | POA: Insufficient documentation

## 2015-08-31 DIAGNOSIS — Z3A34 34 weeks gestation of pregnancy: Secondary | ICD-10-CM | POA: Diagnosis not present

## 2015-08-31 DIAGNOSIS — O99343 Other mental disorders complicating pregnancy, third trimester: Secondary | ICD-10-CM | POA: Insufficient documentation

## 2015-08-31 DIAGNOSIS — N939 Abnormal uterine and vaginal bleeding, unspecified: Secondary | ICD-10-CM | POA: Diagnosis present

## 2015-08-31 NOTE — MAU Note (Signed)
Patient presents at 634 weeks gestation with c/o vaginal bleeding since 2100 and abdominal pain on the way over here. Fetus active. Notes discharge also.

## 2015-09-01 ENCOUNTER — Inpatient Hospital Stay (HOSPITAL_COMMUNITY): Payer: Medicaid Other

## 2015-09-01 DIAGNOSIS — O4693 Antepartum hemorrhage, unspecified, third trimester: Secondary | ICD-10-CM | POA: Diagnosis not present

## 2015-09-01 DIAGNOSIS — Z3A34 34 weeks gestation of pregnancy: Secondary | ICD-10-CM

## 2015-09-01 LAB — URINALYSIS, ROUTINE W REFLEX MICROSCOPIC
Bilirubin Urine: NEGATIVE
Glucose, UA: NEGATIVE mg/dL
Ketones, ur: NEGATIVE mg/dL
Nitrite: NEGATIVE
Protein, ur: NEGATIVE mg/dL
SPECIFIC GRAVITY, URINE: 1.015 (ref 1.005–1.030)
pH: 6 (ref 5.0–8.0)

## 2015-09-01 LAB — WET PREP, GENITAL
Clue Cells Wet Prep HPF POC: NONE SEEN
Sperm: NONE SEEN
Trich, Wet Prep: NONE SEEN
YEAST WET PREP: NONE SEEN

## 2015-09-01 LAB — URINE MICROSCOPIC-ADD ON

## 2015-09-01 NOTE — MAU Provider Note (Signed)
History     CSN: [id number]  Arrival date and time: 08/31/15 2340   First Provider Initiated Contact with Patient 09/01/15 0009      Chief Complaint  Patient presents with  . Vaginal Bleeding  . Abdominal Pain   HPI [name] is a 30 y.o. [id number] at [redacted]w[redacted]d who presents to MAU today with complaint of vaginal bleeding. The patient states that she had a placenta previa earlier in the pregnancy, but had been told it resolved. She is on Lovenox for history of PE at age 48 years old. She is scheduled for induction on 10/08/15. She states bleeding started around 2100. It was light, she has not needed a pad. She saw blood with wiping and in the toilet. She denies UTI symptoms. She was not having abdominal pain at that time, but did have some on her way to MAU which seems to have resolved. She denies contractions or LOF. She reports good fetal movement.   OB History    Gravida Para Term Preterm AB TAB SAB Ectopic Multiple Living   Past Medical History  Diagnosis Date  . LGSIL (low grade squamous intraepithelial dysplasia) 12-07 &3-08    C&B 3-08/CONDYLOMA TREATED 06-19-06  . Complete miscarriage 09-12-06  . ADD (attention deficit disorder)   . Factor V deficiency (HCC)   . Pulmonary embolus (HCC)     age 49  . Ovarian cyst     Past Surgical History  Procedure Laterality Date  . Incise and drain abcess  11-16-09    LEFT LABIA  . Augmentation mammaplasty  08/2009  . Tonsillectomy    . Wisdom tooth extraction    . Ercp N/A 12/26/2014    Procedure: ENDOSCOPIC RETROGRADE CHOLANGIOPANCREATOGRAPHY (ERCP);  Surgeon: Dorena Cookey, MD;  Location: [facility] OR;  Service: Gastroenterology;  Laterality: N/A;  . Cholecystectomy N/A 12/27/2014    Procedure: LAPAROSCOPIC CHOLECYSTECTOMY ;  Surgeon: Gaynelle Adu, MD;  Location: [facility] OR;  Service: General;  Laterality: N/A;    Family History  Problem Relation Age of Onset  . Cancer Maternal Grandmother     skin  . Diabetes  Maternal Grandmother   . Hypertension Maternal Grandfather   . Breast cancer Paternal Grandmother   . Cancer Paternal Grandfather     ? colon    Social History  Substance Use Topics  . Smoking status: Current Some Day Smoker  . Smokeless tobacco: Never Used  . Alcohol Use: Yes     Comment: once a week    Allergies:  Allergies  Allergen Reactions  . Caffeine Anaphylaxis  . Peanut Butter Flavor Anaphylaxis    No prescriptions prior to admission    Review of Systems  Constitutional: Negative for fever and malaise/fatigue.  Gastrointestinal: Positive for abdominal pain. Negative for nausea, vomiting, diarrhea and constipation.  Genitourinary: Negative for dysuria, urgency and frequency.       + vaginal bleeding, mucus discharge Neg - LOF   Physical Exam   Blood pressure 121/80, pulse 88, temperature 98.2 F (36.8 C), temperature source Oral, resp. rate 18, height  (1.753 m), weight 152 lb 9 oz (69.202 kg), last menstrual period 12/31/2014, unknown if currently breastfeeding.  Physical Exam  Nursing note and vitals reviewed. Constitutional: She is oriented to person, place, and time. She appears well-developed and well-nourished. No distress.  HENT:  Head: Normocephalic and atraumatic.  Cardiovascular: Normal rate.   Respiratory: Effort  normal.  GI: Soft. She exhibits no distension and no mass. There is no tenderness. There is no rebound and no guarding.  Genitourinary: Uterus is enlarged. Cervix exhibits no motion tenderness. No bleeding in the vagina. Vaginal discharge (small amount of thin, white discharge noted) found.  Neurological: She is alert and oriented to person, place, and time.  Skin: Skin is warm and dry. No erythema.  Psychiatric: She has a normal mood and affect.  Dilation: 1.5 Cervical Position: Posterior Exam by:: Vonzella NippleJulie Dez Stauffer   Results for orders placed or performed during the hospital encounter of 08/31/15 (from the past 24 hour(s))   Urinalysis, Routine w reflex microscopic (not at [facility])     Status: Abnormal   Collection Time: 08/31/15 11:45 PM  Result Value Ref Range   Color, Urine YELLOW YELLOW   APPearance CLEAR CLEAR   Specific Gravity, Urine 1.015 1.005 - 1.030   pH 6.0 5.0 - 8.0   Glucose, UA NEGATIVE NEGATIVE mg/dL   Hgb urine dipstick TRACE (A) NEGATIVE   Bilirubin Urine NEGATIVE NEGATIVE   Ketones, ur NEGATIVE NEGATIVE mg/dL   Protein, ur NEGATIVE NEGATIVE mg/dL   Nitrite NEGATIVE NEGATIVE   Leukocytes, UA SMALL (A) NEGATIVE  Urine microscopic-add on     Status: Abnormal   Collection Time: 08/31/15 11:45 PM  Result Value Ref Range   Squamous Epithelial / LPF 0-5 (A) NONE SEEN   WBC, UA 0-5 0 - 5 WBC/hpf   RBC / HPF 0-5 0 - 5 RBC/hpf   Bacteria, UA RARE (A) NONE SEEN  Wet prep, genital     Status: Abnormal   Collection Time: 09/01/15 12:40 AM  Result Value Ref Range   Yeast Wet Prep HPF POC NONE SEEN NONE SEEN   Trich, Wet Prep NONE SEEN NONE SEEN   Clue Cells Wet Prep HPF POC NONE SEEN NONE SEEN   WBC, Wet Prep HPF POC MODERATE (A) NONE SEEN   Sperm NONE SEEN     Fetal Monitoring: Baseline: 130 bpm Variability: moderate Accelerations: 15 x 15 Decelerations: none Contractions: none  MAU Course  Procedures  MDM UA today  Discussed patient with Dr. Mora ApplPinn. Recommends wet prep and US today to further evaluate bleeding Wet prep - negative US preliminary shows no evidence of placenta previa or abruption.  Discussed lab and US results with Dr. Mora ApplPinn. Advised pelvic rest and preterm labor precuations. Follow-up in the office next week.  Urine culture pedning   Assessment and Plan  A: SIUP at 6531w6d Vaginal bleeding in pregnancy, third trimester  P: Discharge home Bleeding/preterm labor precautions discussed Patient advised to [facility]. Patient has appointment already scheduled for Tuesday.  Patient may return to MAU as needed or if her condition were to  change or worsen   Marny LowensteinJulie N Daeveon Zweber, PA-C  09/01/2015, 2:17 AM

## 2015-09-01 NOTE — Discharge Instructions (Signed)
Pelvic Rest Pelvic rest is sometimes recommended for women when:   The placenta is partially or completely covering the opening of the cervix (placenta previa).  There is bleeding between the uterine wall and the amniotic sac in the first trimester (subchorionic hemorrhage).  The cervix begins to open without labor starting (incompetent cervix, cervical insufficiency).  The labor is too early (preterm labor). HOME CARE INSTRUCTIONS  Do not have sexual intercourse, stimulation, or an orgasm.  Do not use tampons, douche, or put anything in the vagina.  Do not lift anything over 10 pounds (4.5 kg).  Avoid strenuous activity or straining your pelvic muscles. SEEK MEDICAL CARE IF:  You have any vaginal bleeding during pregnancy. Treat this as a potential emergency.  You have cramping pain felt low in the stomach (stronger than menstrual cramps).  You notice vaginal discharge (watery, mucus, or bloody).  You have a low, dull backache.  There are regular contractions or uterine tightening. SEEK IMMEDIATE MEDICAL CARE IF: You have vaginal bleeding and have placenta previa.    This information is not intended to replace advice given to you by your health care provider. Make sure you discuss any questions you have with your health care provider.   Document Released: 07/21/2010 Document Revised: 06/18/2011 Document Reviewed: 09/27/2014 Elsevier Interactive Patient Education 2016 [employer]  Vaginal Bleeding During Pregnancy, Third Trimester  A small amount of bleeding (spotting) from the vagina is common in pregnancy. Sometimes the bleeding is normal and is not a problem, and sometimes it is a sign of something serious. Be sure to tell your doctor about any bleeding from your vagina right away. HOME CARE  Watch your condition for any changes.  Follow your doctor's instructions about how active you can be.  If you are on bed rest:  You may need to stay in bed and only get up  to use the bathroom.  You may be allowed to do some activities.  If you need help, make plans for someone to help you.  Write down:  The number of pads you use each day.  How often you change pads.  How soaked (saturated) your pads are.  Do not use tampons.  Do not douche.  Do not have sex or orgasms until your doctor says it is okay.  Follow your doctor's advice about lifting, driving, and doing physical activities.  If you pass any tissue from your vagina, save the tissue so you can show it to your doctor.  Only take medicines as told by your doctor.  Do not take aspirin because it can make you bleed.  Keep all follow-up visits as told by your doctor. GET HELP IF:   You bleed from your vagina.  You have cramps.  You have labor pains.  You have a fever that does not go away after you take medicine. GET HELP RIGHT AWAY IF:  You have very bad cramps in your back or belly (abdomen).  You have chills.  You have a gush of fluid from your vagina.  You pass large clots or tissue from your vagina.  You bleed more.  You feel light-headed or weak.  You pass out (faint).  You do not feel your baby move around as much as before. MAKE SURE YOU:  Understand these instructions.  Will watch your condition.  Will get help right away if you are not doing well or get worse.   This information is not intended to replace advice given to you by  your health care provider. Make sure you discuss any questions you have with your health care provider.   Document Released: 08/10/2013 Document Reviewed: 08/10/2013 Elsevier Interactive Patient Education [employer]2016 Elsevier Inc.

## 2015-09-02 LAB — CULTURE, OB URINE: CULTURE: NO GROWTH

## 2015-09-06 LAB — OB RESULTS CONSOLE GBS: STREP GROUP B AG: NEGATIVE

## 2015-09-23 ENCOUNTER — Other Ambulatory Visit: Payer: Self-pay | Admitting: Obstetrics and Gynecology

## 2015-10-02 ENCOUNTER — Inpatient Hospital Stay (HOSPITAL_COMMUNITY)
Admission: RE | Admit: 2015-10-02 | Discharge: 2015-10-03 | DRG: 775 | Disposition: A | Discharge status: Home / Self Care | Payer: Medicaid Other | Source: Ambulatory Visit | Attending: Obstetrics and Gynecology | Admitting: Obstetrics and Gynecology

## 2015-10-02 ENCOUNTER — Encounter (HOSPITAL_COMMUNITY): Payer: Self-pay

## 2015-10-02 ENCOUNTER — Inpatient Hospital Stay (HOSPITAL_COMMUNITY): Payer: Medicaid Other | Admitting: Anesthesiology

## 2015-10-02 DIAGNOSIS — O99334 Smoking (tobacco) complicating childbirth: Secondary | ICD-10-CM | POA: Diagnosis present

## 2015-10-02 DIAGNOSIS — O9912 Other diseases of the blood and blood-forming organs and certain disorders involving the immune mechanism complicating childbirth: Secondary | ICD-10-CM | POA: Diagnosis present

## 2015-10-02 DIAGNOSIS — Z86711 Personal history of pulmonary embolism: Secondary | ICD-10-CM

## 2015-10-02 DIAGNOSIS — D6851 Activated protein C resistance: Secondary | ICD-10-CM | POA: Diagnosis present

## 2015-10-02 DIAGNOSIS — Z3490 Encounter for supervision of normal pregnancy, unspecified, unspecified trimester: Secondary | ICD-10-CM

## 2015-10-02 DIAGNOSIS — Z3A39 39 weeks gestation of pregnancy: Secondary | ICD-10-CM

## 2015-10-02 LAB — CBC
HEMATOCRIT: 31.4 % — AB (ref 36.0–46.0)
Hemoglobin: 10.3 g/dL — ABNORMAL LOW (ref 12.0–15.0)
MCH: 27.4 pg (ref 26.0–34.0)
MCHC: 32.8 g/dL (ref 30.0–36.0)
MCV: 83.5 fL (ref 78.0–100.0)
PLATELETS: 210 10*3/uL (ref 150–400)
RBC: 3.76 MIL/uL — ABNORMAL LOW (ref 3.87–5.11)
RDW: 13.6 % (ref 11.5–15.5)
WBC: 7.2 10*3/uL (ref 4.0–10.5)

## 2015-10-02 LAB — TYPE AND SCREEN
ABO/RH(D): B POS
ANTIBODY SCREEN: NEGATIVE

## 2015-10-02 LAB — ABO/RH: ABO/RH(D): B POS

## 2015-10-02 MED ORDER — OXYTOCIN BOLUS FROM INFUSION: 500.0000 mL | INTRAVENOUS | Status: DC

## 2015-10-02 MED ORDER — COCONUT OIL OIL
1.0000 "application " | TOPICAL_OIL | Status: DC | PRN
  Administered 2015-10-03: 1 via TOPICAL
  Filled 2015-10-02: qty 120

## 2015-10-02 MED ORDER — PHENYLEPHRINE 40 MCG/ML (10ML) SYRINGE FOR IV PUSH (FOR BLOOD PRESSURE SUPPORT)
80.0000 ug | PREFILLED_SYRINGE | INTRAVENOUS | Status: DC | PRN
  Filled 2015-10-02: qty 10
  Filled 2015-10-02: qty 5

## 2015-10-02 MED ORDER — LACTATED RINGERS IV SOLN: 500.0000 mL | INTRAVENOUS | Status: DC | PRN

## 2015-10-02 MED ORDER — OXYTOCIN 40 UNITS IN LACTATED RINGERS INFUSION - SIMPLE MED: 2.5000 [IU]/h | INTRAVENOUS | Status: DC

## 2015-10-02 MED ORDER — OXYCODONE-ACETAMINOPHEN 5-325 MG PO TABS: 2.0000 | ORAL_TABLET | ORAL | Status: DC | PRN

## 2015-10-02 MED ORDER — ACETAMINOPHEN 325 MG PO TABS: 650.0000 mg | ORAL_TABLET | ORAL | Status: DC | PRN

## 2015-10-02 MED ORDER — ONDANSETRON HCL 4 MG/2ML IJ SOLN: 4.0000 mg | Freq: Four times a day (QID) | INTRAMUSCULAR | Status: DC | PRN

## 2015-10-02 MED ORDER — OXYCODONE-ACETAMINOPHEN 5-325 MG PO TABS: 1.0000 | ORAL_TABLET | ORAL | Status: DC | PRN

## 2015-10-02 MED ORDER — FENTANYL 2.5 MCG/ML BUPIVACAINE 1/10 % EPIDURAL INFUSION (WH - ANES)
14.0000 mL/h | INTRAMUSCULAR | Status: DC | PRN
  Administered 2015-10-02: 14 mL/h via EPIDURAL
  Filled 2015-10-02: qty 125

## 2015-10-02 MED ORDER — SENNOSIDES-DOCUSATE SODIUM 8.6-50 MG PO TABS
2.0000 | ORAL_TABLET | ORAL | Status: DC
  Administered 2015-10-02: 2 via ORAL
  Filled 2015-10-02: qty 2

## 2015-10-02 MED ORDER — ENOXAPARIN SODIUM 40 MG/0.4ML ~~LOC~~ SOLN
40.0000 mg | Freq: Two times a day (BID) | SUBCUTANEOUS | Status: DC
  Administered 2015-10-02 – 2015-10-03 (×2): 40 mg via SUBCUTANEOUS
  Filled 2015-10-02 (×4): qty 0.4

## 2015-10-02 MED ORDER — DIPHENHYDRAMINE HCL 25 MG PO CAPS: 25.0000 mg | ORAL_CAPSULE | Freq: Four times a day (QID) | ORAL | Status: DC | PRN

## 2015-10-02 MED ORDER — FLEET ENEMA 7-19 GM/118ML RE ENEM: 1.0000 | ENEMA | Freq: Every day | RECTAL | Status: DC | PRN

## 2015-10-02 MED ORDER — METHYLERGONOVINE MALEATE 0.2 MG/ML IJ SOLN: 0.2000 mg | INTRAMUSCULAR | Status: DC | PRN

## 2015-10-02 MED ORDER — LIDOCAINE HCL (PF) 1 % IJ SOLN
INTRAMUSCULAR | Status: DC | PRN
  Administered 2015-10-02 (×2): 5 mL

## 2015-10-02 MED ORDER — MAGNESIUM HYDROXIDE 400 MG/5ML PO SUSP: 30.0000 mL | ORAL | Status: DC | PRN

## 2015-10-02 MED ORDER — DIPHENHYDRAMINE HCL 50 MG/ML IJ SOLN: 12.5000 mg | INTRAMUSCULAR | Status: DC | PRN

## 2015-10-02 MED ORDER — SIMETHICONE 80 MG PO CHEW: 80.0000 mg | CHEWABLE_TABLET | ORAL | Status: DC | PRN

## 2015-10-02 MED ORDER — METHYLERGONOVINE MALEATE 0.2 MG PO TABS: 0.2000 mg | ORAL_TABLET | ORAL | Status: DC | PRN

## 2015-10-02 MED ORDER — LIDOCAINE HCL (PF) 1 % IJ SOLN
30.0000 mL | INTRAMUSCULAR | Status: DC | PRN
  Filled 2015-10-02: qty 30

## 2015-10-02 MED ORDER — IBUPROFEN 800 MG PO TABS
800.0000 mg | ORAL_TABLET | Freq: Three times a day (TID) | ORAL | Status: DC
  Administered 2015-10-02 – 2015-10-03 (×2): 800 mg via ORAL
  Filled 2015-10-02 (×2): qty 1

## 2015-10-02 MED ORDER — DIBUCAINE 1 % RE OINT: 1.0000 "application " | TOPICAL_OINTMENT | RECTAL | Status: DC | PRN

## 2015-10-02 MED ORDER — SODIUM CHLORIDE 0.9% FLUSH: 3.0000 mL | Freq: Two times a day (BID) | INTRAVENOUS | Status: DC

## 2015-10-02 MED ORDER — SODIUM CHLORIDE 0.9% FLUSH: 3.0000 mL | INTRAVENOUS | Status: DC | PRN

## 2015-10-02 MED ORDER — LACTATED RINGERS IV SOLN
INTRAVENOUS | Status: DC
  Administered 2015-10-02 (×2): via INTRAVENOUS

## 2015-10-02 MED ORDER — BUTORPHANOL TARTRATE 1 MG/ML IJ SOLN: 1.0000 mg | INTRAMUSCULAR | Status: DC | PRN

## 2015-10-02 MED ORDER — MEASLES, MUMPS & RUBELLA VAC ~~LOC~~ INJ
0.5000 mL | INJECTION | Freq: Once | SUBCUTANEOUS | Status: DC
  Filled 2015-10-02: qty 0.5

## 2015-10-02 MED ORDER — SOD CITRATE-CITRIC ACID 500-334 MG/5ML PO SOLN: 30.0000 mL | ORAL | Status: DC | PRN

## 2015-10-02 MED ORDER — OXYTOCIN 40 UNITS IN LACTATED RINGERS INFUSION - SIMPLE MED
1.0000 m[IU]/min | INTRAVENOUS | Status: DC
  Administered 2015-10-02: 2 m[IU]/min via INTRAVENOUS
  Filled 2015-10-02: qty 1000

## 2015-10-02 MED ORDER — ZOLPIDEM TARTRATE 5 MG PO TABS: 5.0000 mg | ORAL_TABLET | Freq: Every evening | ORAL | Status: DC | PRN

## 2015-10-02 MED ORDER — EPHEDRINE 5 MG/ML INJ
10.0000 mg | INTRAVENOUS | Status: DC | PRN
Start: 2015-10-02 — End: 2015-10-02
  Filled 2015-10-02: qty 2

## 2015-10-02 MED ORDER — EPHEDRINE 5 MG/ML INJ
10.0000 mg | INTRAVENOUS | Status: DC | PRN
  Filled 2015-10-02: qty 2

## 2015-10-02 MED ORDER — PHENYLEPHRINE 40 MCG/ML (10ML) SYRINGE FOR IV PUSH (FOR BLOOD PRESSURE SUPPORT)
80.0000 ug | PREFILLED_SYRINGE | INTRAVENOUS | Status: DC | PRN
Start: 2015-10-02 — End: 2015-10-02
  Filled 2015-10-02: qty 5

## 2015-10-02 MED ORDER — ONDANSETRON HCL 4 MG PO TABS: 4.0000 mg | ORAL_TABLET | ORAL | Status: DC | PRN

## 2015-10-02 MED ORDER — TETANUS-DIPHTH-ACELL PERTUSSIS 5-2.5-18.5 LF-MCG/0.5 IM SUSP: 0.5000 mL | Freq: Once | INTRAMUSCULAR | Status: DC

## 2015-10-02 MED ORDER — ONDANSETRON HCL 4 MG/2ML IJ SOLN: 4.0000 mg | INTRAMUSCULAR | Status: DC | PRN

## 2015-10-02 MED ORDER — LACTATED RINGERS IV SOLN
500.0000 mL | Freq: Once | INTRAVENOUS | Status: AC
  Administered 2015-10-02: 500 mL via INTRAVENOUS

## 2015-10-02 MED ORDER — PRENATAL MULTIVITAMIN CH
1.0000 | ORAL_TABLET | Freq: Every day | ORAL | Status: DC
  Administered 2015-10-03: 1 via ORAL
  Filled 2015-10-02: qty 1

## 2015-10-02 MED ORDER — TERBUTALINE SULFATE 1 MG/ML IJ SOLN
0.2500 mg | Freq: Once | INTRAMUSCULAR | Status: DC | PRN
  Filled 2015-10-02: qty 1

## 2015-10-02 MED ORDER — BENZOCAINE-MENTHOL 20-0.5 % EX AERO
1.0000 "application " | INHALATION_SPRAY | CUTANEOUS | Status: DC | PRN
  Administered 2015-10-02 – 2015-10-03 (×2): 1 via TOPICAL
  Filled 2015-10-02 (×2): qty 56

## 2015-10-02 MED ORDER — SODIUM CHLORIDE 0.9 % IV SOLN: 250.0000 mL | INTRAVENOUS | Status: DC | PRN

## 2015-10-02 MED ORDER — FERROUS SULFATE 325 (65 FE) MG PO TABS
325.0000 mg | ORAL_TABLET | Freq: Two times a day (BID) | ORAL | Status: DC
  Administered 2015-10-02 – 2015-10-03 (×2): 325 mg via ORAL
  Filled 2015-10-02 (×2): qty 1

## 2015-10-02 MED ORDER — WITCH HAZEL-GLYCERIN EX PADS: 1.0000 "application " | MEDICATED_PAD | CUTANEOUS | Status: DC | PRN

## 2015-10-02 NOTE — Anesthesia Preprocedure Evaluation (Signed)
Anesthesia Evaluation  Patient identified by MRN, date of birth, ID band Patient awake    Reviewed: Allergy & Precautions, H&P , NPO status , Patient's Chart, lab work & pertinent test results  History of Anesthesia Complications Negative for: history of anesthetic complications  Airway Mallampati: II  TM Distance: >3 FB Neck ROM: full    Dental no notable dental hx. (+) Teeth Intact   Pulmonary neg pulmonary ROS, Current Smoker,    Pulmonary exam normal breath sounds clear to auscultation       Cardiovascular negative cardio ROS Normal cardiovascular exam Rhythm:regular Rate:Normal     Neuro/Psych negative neurological ROS  negative psych ROS   GI/Hepatic negative GI ROS, Neg liver ROS,   Endo/Other  negative endocrine ROS  Renal/GU negative Renal ROS  negative genitourinary   Musculoskeletal   Abdominal   Peds  Hematology negative hematology ROS (+) Factor V Leiden Last Lovenox >24hrs ago   Anesthesia Other Findings   Reproductive/Obstetrics (+) Pregnancy                             Anesthesia Physical Anesthesia Plan  ASA: II  Anesthesia Plan: Epidural   Post-op Pain Management:    Induction:   Airway Management Planned:   Additional Equipment:   Intra-op Plan:   Post-operative Plan:   Informed Consent: I have reviewed the patients History and Physical, chart, labs and discussed the procedure including the risks, benefits and alternatives for the proposed anesthesia with the patient or authorized representative who has indicated his/her understanding and acceptance.     Plan Discussed with:   Anesthesia Plan Comments:         Anesthesia Quick Evaluation

## 2015-10-02 NOTE — H&P (Signed)
30 y.o. 5177w2d  [id number] comes in for induction at term with Factor V Leiden and hx of PE at 30 yo.  Pt had been on prophylactic Lovenox since before pregnancy and was continued on therapeutic lovenox through pregnancy.  She has remained on the 40 mg BID dose from the start as her weight has been stable.  Pt's last dose of lovenox was yesterday morning.  Otherwise has good fetal movement and no bleeding.  Past Medical History  Diagnosis Date  . LGSIL (low grade squamous intraepithelial dysplasia) 12-07 &3-08    C&B 3-08/CONDYLOMA TREATED 06-19-06  . Complete miscarriage 09-12-06  . ADD (attention deficit disorder)   . Factor V deficiency (HCC)   . Pulmonary embolus (HCC)     age 89  . Ovarian cyst     Past Surgical History  Procedure Laterality Date  . Incise and drain abcess  11-16-09    LEFT LABIA  . Augmentation mammaplasty  08/2009  . Tonsillectomy    . Wisdom tooth extraction    . Ercp N/A 12/26/2014    Procedure: ENDOSCOPIC RETROGRADE CHOLANGIOPANCREATOGRAPHY (ERCP);  Surgeon: Dorena CookeyJohn Hayes, MD;  [facility];  Service: Gastroenterology;  Laterality: N/A;  . Cholecystectomy N/A 12/27/2014    Procedure: LAPAROSCOPIC CHOLECYSTECTOMY ;  Surgeon: Gaynelle AduEric Wilson, MD;  Location: [facility] OR;  Service: General;  Laterality: N/A;    OB History  Gravida Para Term Preterm AB SAB TAB Ectopic Multiple Living  4 2 2  1 1    1     # Outcome Date GA Lbr Len/2nd Weight Sex Delivery Anes PTL Lv  4 Current           3 Term 10/04/13 4966w6d 03:05 / 00:21 3.385 kg (7 lb 7.4 oz) F Vag-Spont EPI    2 Term 2009 9428w0d  2.977 kg (6 lb 9 oz) F Vag-Spont   Y  1 SAB 2007              Social History   Social History  . Marital Status: Single    Spouse Name: N/A  . Number of Children: N/A  . Years of Education: N/A   Occupational History  . Not on file.   Social History Main Topics  . Smoking status: Current Some Day Smoker  . Smokeless tobacco: Never Used  . Alcohol Use: Yes     Comment: once a week  .  Drug Use: No  . Sexual Activity: Yes    Birth Control/ Protection: None   Other Topics Concern  . Not on file   Social History Narrative   Caffeine and Peanut butter flavor    Prenatal Transfer Tool  Maternal Diabetes: No Genetic Screening: Normal Maternal Ultrasounds/Referrals: Normal Fetal Ultrasounds or other Referrals:  None Maternal Substance Abuse:  Yes:  Type: Smoker Significant Maternal Medications:  Meds include: Other: Lovenox 40 mg BID Significant Maternal Lab Results: None  Other PNC: Otherwise uncomplicated.    There were no vitals filed for this visit.  Lungs/Cor:  NAD Abdomen:  soft, gravid Ex:  no cords, erythema SVE:  3/60/-2, AROM clear FHTs:  130, good STV, NST R Toco:  q occ   A/P   [id number] 4677w2d for induction at term for hx of PE/Factor V Leiden  GBS neg.  Tenille Morrill A

## 2015-10-02 NOTE — Anesthesia Procedure Notes (Signed)
Epidural Patient location during procedure: OB  Staffing Anesthesiologist: Izac Faulkenberry Performed by: anesthesiologist   Preanesthetic Checklist Completed: patient identified, site marked, surgical consent, pre-op evaluation, timeout performed, IV checked, risks and benefits discussed and monitors and equipment checked  Epidural Patient position: sitting Prep: DuraPrep Patient monitoring: heart rate, continuous pulse ox and blood pressure Approach: right paramedian Location: L3-L4 Injection technique: LOR saline  Needle:  Needle type: Tuohy  Needle gauge: 17 G Needle length: 9 cm and 9 Needle insertion depth: 5 cm Catheter type: closed end flexible Catheter size: 20 Guage Catheter at skin depth: 10 cm Test dose: negative  Assessment Events: blood not aspirated, injection not painful, no injection resistance, negative IV test and no paresthesia  Additional Notes Patient identified. Risks/Benefits/Options discussed with patient including but not limited to bleeding, infection, nerve damage, paralysis, failed block, incomplete pain control, headache, blood pressure changes, nausea, vomiting, reactions to medication both or allergic, itching and postpartum back pain. Confirmed with bedside nurse the patient's most recent platelet count. Confirmed with patient that they are not currently taking any anticoagulation, have any bleeding history or any family history of bleeding disorders. Patient expressed understanding and wished to proceed. All questions were answered. Sterile technique was used throughout the entire procedure. Please see nursing notes for vital signs. Test dose was given through epidural needle and negative prior to continuing to dose epidural or start infusion. Warning signs of high block given to the patient including shortness of breath, tingling/numbness in hands, complete motor block, or any concerning symptoms with instructions to call for help. Patient was given  instructions on fall risk and not to get out of bed. All questions and concerns addressed with instructions to call with any issues.   

## 2015-10-02 NOTE — Anesthesia Pain Management Evaluation Note (Signed)
  CRNA Pain Management Visit Note  Patient: Peggy LocksKrista D Castronovo [facility] [place], 30 y.o., female  "Hello I am a member of the anesthesia team at [facility]. We have an anesthesia team available at all times to provide care throughout the hospital, including epidural management and anesthesia for C-section. I don't know your plan for the delivery whether it a natural birth, water birth, IV sedation, nitrous supplementation, doula or epidural, but we want to meet your pain goals."   1.Was your pain managed to your expectations on prior hospitalizations?   yes  2.What is your expectation for pain management during this hospitalization?     natural  3.How can we help you reach that goal? Epidural if needed  Record the patient's initial score and the patient's pain goal.   Pain: 0  Pain Goal:8  The [facility] wants you to be able to say your pain was always managed very well.  [facility],Apphia Cropley 10/02/2015

## 2015-10-03 LAB — CBC
HEMATOCRIT: 30 % — AB (ref 36.0–46.0)
HEMOGLOBIN: 9.8 g/dL — AB (ref 12.0–15.0)
MCH: 27.6 pg (ref 26.0–34.0)
MCHC: 32.7 g/dL (ref 30.0–36.0)
MCV: 84.5 fL (ref 78.0–100.0)
PLATELETS: 204 10*3/uL (ref 150–400)
RBC: 3.55 MIL/uL — AB (ref 3.87–5.11)
RDW: 13.9 % (ref 11.5–15.5)
WBC: 7.5 10*3/uL (ref 4.0–10.5)

## 2015-10-03 LAB — RPR: RPR Ser Ql: NONREACTIVE

## 2015-10-03 MED ORDER — IBUPROFEN 600 MG PO TABS: 600.0000 mg | ORAL_TABLET | Freq: Four times a day (QID) | ORAL | Status: DC | PRN

## 2015-10-03 MED ORDER — DOCUSATE SODIUM 100 MG PO CAPS: 100.0000 mg | ORAL_CAPSULE | Freq: Two times a day (BID) | ORAL | Status: DC

## 2015-10-03 MED ORDER — OXYCODONE-ACETAMINOPHEN 5-325 MG PO TABS: 2.0000 | ORAL_TABLET | ORAL | Status: DC | PRN

## 2015-10-03 NOTE — Lactation Note (Signed)
This note was copied from a baby's chart. Lactation Consultation Note  Patient Name: Boy Jesus GeneraKrista Lafond - [place] Today's Date: 10/03/2015 Reason for consult: Follow-up assessment Baby at 23 hr of life and set for D/C today. Mom reports baby is latching well to the R but not as well to the L. She thinks the L was smaller before she had implants. Encouraged her to try football position on L instead of cradle and continue post pumping the L. She has coconut oil in the room because "she might need it later". She denies breast or nipple pain. She had questions about supply. Discussed baby behavior, feeding frequency, baby belly size, breast changes, engorgement treatment/prevention, and nipple care. She stated she had a DEBP at home and knows how to manually express. She is aware of lactation services and support group. She will call as needed.    Maternal Data    Feeding    LATCH Score/Interventions                      Lactation Tools Discussed/Used WIC Program: No   Consult Status Consult Status: Complete Follow-up type: Call as needed    Rulon Eisenmengerlizabeth E Ho Parisi 10/03/2015, 12:18 PM

## 2015-10-03 NOTE — Progress Notes (Signed)
Pt is factor V leiden heterozygote with h/o of prior episode of VTE Antepartum anticoagulation was intermediated dose Per ACOG guidelines will continue intermediate dose anticoagulation with 80mg  [place] Qday Pt has 2 boxes of 40mg  [place] lovenox at home Will exhaust that supply Instructions reviewed and patient understands new dosing

## 2015-10-03 NOTE — Lactation Note (Signed)
This note was copied from a baby's chart. Lactation Consultation Note Experienced BF mom, BF her 1st child who is now 847 yrs old, BF for a few months. Her 30 yr old BF for 5 months. Mom has implants before the 2nd child. Mom supplemented and switched to formula at 5 months. Encouraged mom to pump after BF. Mom has hand pump stating she was pumping. Encouraged to use DEBP, wouldn't acknowledge doing that. Baby laying on mom STS BF at intervals. Baby's nose very stuffy, snorting. Mom has good everted nipples. Mom tired not really paying much attention or answering questions. Left pamphlet, encouraged to call for assistance. Patient Name: Peggy [name] - [place] Today's Date: 10/03/2015 Reason for consult: Initial assessment   Maternal Data Has patient been taught Hand Expression?: Yes Does the patient have breastfeeding experience prior to this delivery?: Yes  Feeding Feeding Type: Breast Fed Length of feed: 15 min  LATCH Score/Interventions Latch: Repeated attempts needed to sustain latch, nipple held in mouth throughout feeding, stimulation needed to elicit sucking reflex.  Audible Swallowing: None Intervention(s): Skin to skin;Hand expression  Type of Nipple: Everted at rest and after stimulation  Comfort (Breast/Nipple): Soft / non-tender     Hold (Positioning): No assistance needed to correctly position infant at breast. Intervention(s): Skin to skin;Position options;Support Pillows;Breastfeeding basics reviewed  LATCH Score: 7  Lactation Tools Discussed/Used     Consult Status Consult Status: Follow-up Date: 10/04/15 Follow-up type: In-patient    Peggy [name], Diamond NickelLAURA G 10/03/2015, 4:00 AM

## 2015-10-03 NOTE — Discharge Summary (Signed)
Obstetric Discharge Summary Reason for Admission: induction of labor Prenatal Procedures: ultrasound Intrapartum Procedures: spontaneous vaginal delivery Postpartum Procedures: none Complications-Operative and Postpartum: 1st degree perineal laceration HEMOGLOBIN  Date Value Ref Range Status  10/03/2015 9.8* 12.0 - 15.0 g/dL Final   HCT  Date Value Ref Range Status  10/03/2015 30.0* 36.0 - 46.0 % Final    Physical Exam:  General: alert, cooperative and appears stated age 43Lochia: appropriate Uterine Fundus: firm DVT Evaluation: No evidence of DVT seen on physical exam.  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   Specialty:  Obstetrics and Gynecology   Contact information:   [address] [place] [id number] [phone]       Newborn Data: Live born female  Birth Weight: 6 lb 11.2 oz (3039 g) APGAR: 8, 9  Home with mother.  Kalika Smay H. 10/03/2015, 10:16 AM

## 2015-10-03 NOTE — Anesthesia Postprocedure Evaluation (Signed)
Anesthesia Post Note  Patient: Peggy [name] - [place]  Procedure(s) Performed: * No procedures listed *  Patient location during evaluation: Mother Baby Anesthesia Type: Epidural Level of consciousness: awake, awake and alert, oriented and patient cooperative Pain control: pain not well controlled with epidural during labor; postpartum pain well controlled. Vital Signs Assessment: post-procedure vital signs reviewed and stable Respiratory status: spontaneous breathing, nonlabored ventilation and respiratory function stable Cardiovascular status: stable Postop Assessment: no headache, no backache, patient able to bend at knees and no signs of nausea or vomiting Anesthetic complications: no     Last Vitals:  Filed Vitals:   10/02/15 2015 10/03/15 0605  BP: 116/80 109/80  Pulse: 77 61  Temp: 36.4 C 36.7 C  Resp: 18 18    Last Pain:  Filed Vitals:   10/03/15 0606  PainSc: 0-No pain   Pain Goal: Patients Stated Pain Goal: 8 (10/02/15 0803)               Elvia Aydin L

## 2015-10-07 ENCOUNTER — Inpatient Hospital Stay (HOSPITAL_COMMUNITY): Payer: Medicaid Other

## 2016-07-20 ENCOUNTER — Inpatient Hospital Stay (HOSPITAL_COMMUNITY): Payer: Medicaid Other

## 2016-07-20 ENCOUNTER — Encounter (HOSPITAL_COMMUNITY): Payer: Self-pay

## 2016-07-20 ENCOUNTER — Telehealth: Payer: Self-pay | Admitting: *Deleted

## 2016-07-20 ENCOUNTER — Inpatient Hospital Stay (HOSPITAL_COMMUNITY)
Admission: AD | Admit: 2016-07-20 | Discharge: 2016-07-20 | Disposition: A | Discharge status: Home / Self Care | Payer: Medicaid Other | Source: Ambulatory Visit | Attending: Obstetrics and Gynecology | Admitting: Obstetrics and Gynecology

## 2016-07-20 DIAGNOSIS — R1032 Left lower quadrant pain: Secondary | ICD-10-CM

## 2016-07-20 DIAGNOSIS — R112 Nausea with vomiting, unspecified: Secondary | ICD-10-CM | POA: Diagnosis not present

## 2016-07-20 DIAGNOSIS — N83202 Unspecified ovarian cyst, left side: Secondary | ICD-10-CM

## 2016-07-20 DIAGNOSIS — R109 Unspecified abdominal pain: Secondary | ICD-10-CM | POA: Insufficient documentation

## 2016-07-20 LAB — URINALYSIS, ROUTINE W REFLEX MICROSCOPIC
Bilirubin Urine: NEGATIVE
Glucose, UA: NEGATIVE mg/dL
Ketones, ur: NEGATIVE mg/dL
Nitrite: NEGATIVE
PH: 5 (ref 5.0–8.0)
Protein, ur: 30 mg/dL — AB
SPECIFIC GRAVITY, URINE: 1.029 (ref 1.005–1.030)

## 2016-07-20 LAB — CBC
HEMATOCRIT: 41 % (ref 36.0–46.0)
HEMOGLOBIN: 13.7 g/dL (ref 12.0–15.0)
MCH: 29.6 pg (ref 26.0–34.0)
MCHC: 33.4 g/dL (ref 30.0–36.0)
MCV: 88.6 fL (ref 78.0–100.0)
Platelets: 227 10*3/uL (ref 150–400)
RBC: 4.63 MIL/uL (ref 3.87–5.11)
RDW: 14.6 % (ref 11.5–15.5)
WBC: 10.8 10*3/uL — AB (ref 4.0–10.5)

## 2016-07-20 LAB — POCT PREGNANCY, URINE: Preg Test, Ur: NEGATIVE

## 2016-07-20 MED ORDER — HYDROCODONE-ACETAMINOPHEN 5-325 MG PO TABS: 1.0000 | ORAL_TABLET | Freq: Four times a day (QID) | ORAL | 0 refills | Status: DC | PRN

## 2016-07-20 MED ORDER — LACTATED RINGERS IV BOLUS (SEPSIS)
1000.0000 mL | Freq: Once | INTRAVENOUS | Status: AC
  Administered 2016-07-20: 1000 mL via INTRAVENOUS

## 2016-07-20 MED ORDER — HYDROMORPHONE HCL 1 MG/ML IJ SOLN
INTRAMUSCULAR | Status: AC
  Filled 2016-07-20: qty 1

## 2016-07-20 MED ORDER — ONDANSETRON HCL 4 MG/2ML IJ SOLN
4.0000 mg | Freq: Once | INTRAMUSCULAR | Status: AC
  Administered 2016-07-20: 4 mg via INTRAVENOUS
  Filled 2016-07-20: qty 2

## 2016-07-20 MED ORDER — ONDANSETRON HCL 4 MG PO TABS: 4.0000 mg | ORAL_TABLET | Freq: Four times a day (QID) | ORAL | 0 refills | Status: DC

## 2016-07-20 MED ORDER — HYDROMORPHONE HCL 1 MG/ML IJ SOLN
1.0000 mg | Freq: Once | INTRAMUSCULAR | Status: AC
  Administered 2016-07-20: 1 mg via INTRAVENOUS

## 2016-07-20 MED ORDER — HYDROMORPHONE HCL 1 MG/ML IJ SOLN
1.0000 mg | Freq: Once | INTRAMUSCULAR | Status: AC
  Administered 2016-07-20: 1 mg via INTRAVENOUS
  Filled 2016-07-20: qty 1

## 2016-07-20 NOTE — Discharge Instructions (Signed)
Ovarian Cyst °An ovarian cyst is a fluid-filled sac on an ovary. The ovaries are organs that make eggs in women. Most ovarian cysts go away on their own and are not cancerous (are benign). Some cysts need treatment. °Follow these instructions at home: °· Take over-the-counter and prescription medicines only as told by your doctor. °· Do not drive or use heavy machinery while taking prescription pain medicine. °· Get pelvic exams and Pap tests as often as told by your doctor. °· Return to your normal activities as told by your doctor. Ask your doctor what activities are safe for you. °· Do not use any products that contain nicotine or tobacco, such as cigarettes and e-cigarettes. If you need help quitting, ask your doctor. °· Keep all follow-up visits as told by your doctor. This is important. °Contact a doctor if: °· Your periods are: °¨ Late. °¨ Irregular. °¨ Painful. °· Your periods stop. °· You have pelvic pain that does not go away. °· You have pressure on your bladder. °· You have trouble making your bladder empty when you pee (urinate). °· You have pain during sex. °· You have any of the following in your belly (abdomen): °¨ A feeling of fullness. °¨ Pressure. °¨ Discomfort. °¨ Pain that does not go away. °¨ Swelling. °· You feel sick most of the time. °· You have trouble pooping (have constipation). °· You are not as hungry as usual (you lose your appetite). °· You get very bad acne. °· You start to have more hair on your body and face. °· You are gaining weight or losing weight without changing your exercise and eating habits. °· You think you may be pregnant. °Get help right away if: °· You have belly pain that is very bad or gets worse. °· You cannot eat or drink without throwing up (vomiting). °· You suddenly get a fever. °· Your period is a lot heavier than usual. °This information is not intended to replace advice given to you by your health care provider. Make sure you discuss any questions you have  with your health care provider. °Document Released: 09/12/2007 Document Revised: 10/14/2015 Document Reviewed: 08/28/2015 °Elsevier Interactive Patient Education © 2017 [employer] ° °

## 2016-07-20 NOTE — MAU Provider Note (Signed)
History     CSN: [id number]  Arrival date and time: 07/20/16 1737   First Provider Initiated Contact with Patient 07/20/16 1808      Chief Complaint  Patient presents with  . Abdominal Cramping  . Nausea   Peggy [name] is a 31 y.o. [phone] female who presents for abdominal cramping with onset of menses. Patient states she has regular monthly cycles with heavy bleeding. Current menses started today; lighter flow than normal but increase in pain. States she has never had cycle this painful. States pain is causing her to vomit; has reportedly vomited 6 times today and been unable to keep down food.  Patient previously seen by Dr. Audie Box but hasn't seen him in 4 years due to her pregnancies when she was seen by Dr. Henderson Cloud. Plans on returning to Dr. Audie Box now that she is "done having babies". States she called Dr. Henderson Cloud today & was told to "call Dr. Audie Box & see what he says". She called Dr. Reynold Bowen office & was told to come to MAU.    Abdominal Cramping  This is a new problem. The current episode started today. The onset quality is sudden. The problem occurs intermittently. The problem has been gradually worsening. The pain is located in the suprapubic region and LLQ. The pain is at a severity of 10/10. The quality of the pain is cramping. The abdominal pain does not radiate. Associated symptoms include nausea and vomiting. Pertinent negatives include no constipation, diarrhea, dysuria, fever, headaches, hematuria or myalgias. The pain is aggravated by movement. The pain is relieved by nothing. She has tried acetaminophen (ibuprofen, advil, motrin) for the symptoms. The treatment provided no relief. 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 ADD (attention deficit disorder)   . Complete miscarriage 09-12-06  . Factor V deficiency (HCC)   . LGSIL (low grade squamous intraepithelial dysplasia) 12-07 &3-08   C&B 3-08/CONDYLOMA TREATED  06-19-06  . Ovarian cyst   . Pulmonary embolus [facility])    age 73    Past Surgical History:  Procedure Laterality Date  . AUGMENTATION MAMMAPLASTY  08/2009  . CHOLECYSTECTOMY N/A 12/27/2014   Procedure: LAPAROSCOPIC CHOLECYSTECTOMY ;  Surgeon: Gaynelle Adu, MD;  Location: [facility] OR;  Service: General;  Laterality: N/A;  . ERCP N/A 12/26/2014   Procedure: ENDOSCOPIC RETROGRADE CHOLANGIOPANCREATOGRAPHY (ERCP);  Surgeon: Dorena Cookey, MD;  Location: [facility] OR;  Service: Gastroenterology;  Laterality: N/A;  . INCISE AND DRAIN ABCESS  11-16-09   LEFT LABIA  . TONSILLECTOMY    . WISDOM TOOTH EXTRACTION      Family History  Problem Relation Age of Onset  . Cancer Maternal Grandmother     skin  . Diabetes Maternal Grandmother   . Hypertension Maternal Grandfather   . Breast cancer Paternal Grandmother   . Cancer Paternal Grandfather     ? colon    Social History  Substance Use Topics  . Smoking status: Current Some Day Smoker    Packs/day: 0.25  . Smokeless tobacco: Never Used  . Alcohol use No     Comment: once a week    Allergies:  Allergies  Allergen Reactions  . Caffeine Anaphylaxis  . Peanut Butter Flavor Anaphylaxis    Prescriptions Prior to Admission  Medication Sig Dispense Refill Last Dose  . docusate sodium (COLACE) 100 MG capsule Take 1 capsule (100 mg total) by mouth 2 (two) times daily. 60 capsule 0   . enoxaparin (  LOVENOX) 40 MG/0.4ML injection Inject 0.4 mLs (40 mg total) into the skin every 12 (twelve) hours. 60 Syringe 3 10/01/2015 at Unknown time  . FOLIC ACID PO Take 1 tablet by mouth daily.   10/02/2015 at Unknown time  . ibuprofen (ADVIL,MOTRIN) 600 MG tablet Take 1 tablet (600 mg total) by mouth every 6 (six) hours as needed. 90 tablet 0   . [DISCONTINUED] oxyCODONE-acetaminophen (ROXICET) 5-325 MG tablet Take 2 tablets by mouth every 4 (four) hours as needed. May take 1-2 tablets every 4-6 hours as needed for pain 20 tablet 0     Review of Systems  Constitutional:  Negative.  Negative for fever.  Gastrointestinal: Positive for abdominal pain, nausea and vomiting. Negative for constipation and diarrhea.  Genitourinary: Positive for vaginal bleeding. Negative for dysuria and hematuria.  Musculoskeletal: Negative for myalgias.  Neurological: Positive for dizziness. Negative for syncope and headaches.   Physical Exam   Blood pressure (!) 97/56, pulse 76, temperature 98.6 F (37 C), resp. rate 18, height  (1.803 m), weight 157 lb (71.2 kg), last menstrual period 07/20/2016, SpO2 99 %, unknown if currently breastfeeding.  Physical Exam  Nursing note and vitals reviewed. Constitutional: She is oriented to person, place, and time. She appears well-developed and well-nourished. No distress.  HENT:  Head: Normocephalic and atraumatic.  Eyes: Conjunctivae are normal. Right eye exhibits no discharge. Left eye exhibits no discharge. No scleral icterus.  Neck: Normal range of motion.  Cardiovascular: Normal rate, regular rhythm and normal heart sounds.   No murmur heard. Respiratory: Effort normal and breath sounds normal. No respiratory distress. She has no wheezes.  GI: Soft. Bowel sounds are normal. She exhibits no distension. There is tenderness in the left lower quadrant. There is no rigidity, no rebound and no guarding.  Genitourinary: Uterus normal. Cervix exhibits no motion tenderness. Right adnexum displays no mass and no tenderness. Left adnexum displays mass and tenderness.  Neurological: She is alert and oriented to person, place, and time.  Skin: Skin is warm and dry. She is not diaphoretic.  Psychiatric: She has a normal mood and affect. Her behavior is normal. Judgment and thought content normal.    MAU Course  Procedures Results for orders placed or performed during the hospital encounter of 07/20/16 (from the past 48 hour(s))  Urinalysis, Routine w reflex microscopic     Status: Abnormal   Collection Time: 07/20/16  5:45 PM  Result  Value Ref Range   Color, Urine YELLOW YELLOW   APPearance CLOUDY (A) CLEAR   Specific Gravity, Urine 1.029 1.005 - 1.030   pH 5.0 5.0 - 8.0   Glucose, UA NEGATIVE NEGATIVE mg/dL   Hgb urine dipstick LARGE (A) NEGATIVE   Bilirubin Urine NEGATIVE NEGATIVE   Ketones, ur NEGATIVE NEGATIVE mg/dL   Protein, ur 30 (A) NEGATIVE mg/dL   Nitrite NEGATIVE NEGATIVE   Leukocytes, UA MODERATE (A) NEGATIVE   RBC / HPF TOO NUMEROUS TO COUNT 0 - 5 RBC/hpf   WBC, UA TOO NUMEROUS TO COUNT 0 - 5 WBC/hpf   Bacteria, UA FEW (A) NONE SEEN   Squamous Epithelial / LPF 0-5 (A) NONE SEEN   Mucous PRESENT   Pregnancy, urine POC     Status: None   Collection Time: 07/20/16  5:53 PM  Result Value Ref Range   Preg Test, Ur NEGATIVE NEGATIVE    Comment:        THE SENSITIVITY OF THIS METHODOLOGY IS >24 mIU/mL   CBC  Status: Abnormal   Collection Time: 07/20/16  6:30 PM  Result Value Ref Range   WBC 10.8 (H) 4.0 - 10.5 K/uL   RBC 4.63 3.87 - 5.11 MIL/uL   Hemoglobin 13.7 12.0 - 15.0 g/dL   HCT 16.1 09.6 - 04.5 %   MCV 88.6 78.0 - 100.0 fL   MCH 29.6 26.0 - 34.0 pg   MCHC 33.4 30.0 - 36.0 g/dL   RDW 40.9 81.1 - 91.4 %   Platelets 227 150 - 400 K/uL   US Transvaginal Non-ob  Result Date: 07/20/2016 CLINICAL DATA:  LEFT lower quadrant pain. Heavy vaginal bleeding, severe vomiting and cramps. History of ovarian cyst. EXAM: TRANSABDOMINAL AND TRANSVAGINAL ULTRASOUND OF PELVIS TECHNIQUE: Both transabdominal and transvaginal ultrasound examinations of the pelvis were performed. Transabdominal technique was performed for global imaging of the pelvis including uterus, ovaries, adnexal regions, and pelvic cul-de-sac. It was necessary to proceed with endovaginal exam following the transabdominal exam to visualize the adnexa. COMPARISON:  None FINDINGS: Uterus Measurements: 9.2 x 4.5 x 5.5 cm. No fibroids or other mass visualized. Endometrium Thickness: 8.5 mm.  No focal abnormality visualized. Right ovary  Measurements: 4.1 x 1.5 x 2.2 cm. Normal appearance/no adnexal mass. Left ovary Measurements: 4 x 2 x 2.7 cm. Normal appearance. Heterogeneously echogenic 1.9 x 1.2 x 1.8 cm ovarian mass with increased through transmission and no vascularity. Other findings No abnormal free fluid. IMPRESSION: 1.9 cm LEFT ovarian hemorrhagic cyst.  No indicated follow-up. Otherwise negative pelvic ultrasound. Electronically Signed   By: Awilda Metro M.D.   On: 07/20/2016 21:02   US Pelvis Complete  Result Date: 07/20/2016 CLINICAL DATA:  LEFT lower quadrant pain. Heavy vaginal bleeding, severe vomiting and cramps. History of ovarian cyst. EXAM: TRANSABDOMINAL AND TRANSVAGINAL ULTRASOUND OF PELVIS TECHNIQUE: Both transabdominal and transvaginal ultrasound examinations of the pelvis were performed. Transabdominal technique was performed for global imaging of the pelvis including uterus, ovaries, adnexal regions, and pelvic cul-de-sac. It was necessary to proceed with endovaginal exam following the transabdominal exam to visualize the adnexa. COMPARISON:  None FINDINGS: Uterus Measurements: 9.2 x 4.5 x 5.5 cm. No fibroids or other mass visualized. Endometrium Thickness: 8.5 mm.  No focal abnormality visualized. Right ovary Measurements: 4.1 x 1.5 x 2.2 cm. Normal appearance/no adnexal mass. Left ovary Measurements: 4 x 2 x 2.7 cm. Normal appearance. Heterogeneously echogenic 1.9 x 1.2 x 1.8 cm ovarian mass with increased through transmission and no vascularity. Other findings No abnormal free fluid. IMPRESSION: 1.9 cm LEFT ovarian hemorrhagic cyst.  No indicated follow-up. Otherwise negative pelvic ultrasound. Electronically Signed   By: Awilda Metro M.D.   On: 07/20/2016 21:02    MDM UPT negative CBC -- hemoglobin stable VSS IV fluids, zofran 4 mg IV, dilaudid 1 mg IV S/w Dr. Edward Jolly regarding patient presentation. Will proceed with pelvic ultrasound  Care turned over to [facility]     Judeth Horn, NP  07/20/2016 8:06 PM   2000 - Care assumed from Judeth Horn, NP. Patient waiting for [place].  Discussed results with Dr. Edward Jolly. Ok for discharge at this time with Rx for limited Vicodin (#5) for the weekend. Call Dr. Reynold Bowen office on Monday for a follow-up appointment.   Assessment and Plan  A: Left hemorrhagic ovarian cyst   P: Discharge home Rx for Vicodin given to patient  NSAIDs for breakthrough pain advised Warning signs for worsening condition discussed Patient advised to follow-up with Dr. Audie Box as indicated. Patient should call the office on  Monday for an appointment.  Patient may return to MAU as needed or if her condition were to change or worsen  Marny Lowenstein, PA-C  07/20/2016 9:15 PM

## 2016-07-20 NOTE — Telephone Encounter (Signed)
Pt called c/o heavy vaginal bleeding, severe vomiting and extreme cramps with cycle. Pt cramps are sharp and intense,unable to keep anything down. I advised pt to go to ER due to the above symptoms, pt said her husband is on the way home from work to pick up her and take. Pt was last seen here in 2014. Pt verbalized she understood.

## 2017-01-02 ENCOUNTER — Emergency Department (HOSPITAL_COMMUNITY)
Admission: EM | Admit: 2017-01-02 | Discharge: 2017-01-02 | Disposition: A | Discharge status: Home / Self Care | Payer: Medicaid Other | Attending: Emergency Medicine | Admitting: Emergency Medicine

## 2017-01-02 ENCOUNTER — Emergency Department (HOSPITAL_COMMUNITY): Payer: Medicaid Other

## 2017-01-02 ENCOUNTER — Encounter (HOSPITAL_COMMUNITY): Payer: Self-pay | Admitting: Emergency Medicine

## 2017-01-02 DIAGNOSIS — E876 Hypokalemia: Secondary | ICD-10-CM | POA: Insufficient documentation

## 2017-01-02 DIAGNOSIS — Z91010 Allergy to peanuts: Secondary | ICD-10-CM | POA: Insufficient documentation

## 2017-01-02 DIAGNOSIS — F17200 Nicotine dependence, unspecified, uncomplicated: Secondary | ICD-10-CM | POA: Diagnosis not present

## 2017-01-02 DIAGNOSIS — Z79899 Other long term (current) drug therapy: Secondary | ICD-10-CM | POA: Insufficient documentation

## 2017-01-02 DIAGNOSIS — Z7901 Long term (current) use of anticoagulants: Secondary | ICD-10-CM | POA: Insufficient documentation

## 2017-01-02 DIAGNOSIS — R1110 Vomiting, unspecified: Secondary | ICD-10-CM

## 2017-01-02 DIAGNOSIS — R112 Nausea with vomiting, unspecified: Secondary | ICD-10-CM | POA: Diagnosis not present

## 2017-01-02 DIAGNOSIS — R1012 Left upper quadrant pain: Secondary | ICD-10-CM | POA: Diagnosis not present

## 2017-01-02 LAB — COMPREHENSIVE METABOLIC PANEL
ALT: 110 U/L — ABNORMAL HIGH (ref 14–54)
ANION GAP: 9 (ref 5–15)
AST: 54 U/L — ABNORMAL HIGH (ref 15–41)
Albumin: 3.6 g/dL (ref 3.5–5.0)
Alkaline Phosphatase: 83 U/L (ref 38–126)
BUN: <5 mg/dL — ABNORMAL LOW (ref 6–20)
CHLORIDE: 101 mmol/L (ref 101–111)
CO2: 25 mmol/L (ref 22–32)
Calcium: 8.5 mg/dL — ABNORMAL LOW (ref 8.9–10.3)
Creatinine, Ser: 0.8 mg/dL (ref 0.44–1.00)
GFR calc non Af Amer: >60 mL/min (ref >60)
Glucose, Bld: 93 mg/dL (ref 65–99)
POTASSIUM: 2.8 mmol/L — AB (ref 3.5–5.1)
SODIUM: 135 mmol/L (ref 135–145)
Total Bilirubin: 0.4 mg/dL (ref 0.3–1.2)
Total Protein: 6.1 g/dL — ABNORMAL LOW (ref 6.5–8.1)

## 2017-01-02 LAB — URINALYSIS, ROUTINE W REFLEX MICROSCOPIC
BILIRUBIN URINE: NEGATIVE
GLUCOSE, UA: NEGATIVE mg/dL
Hgb urine dipstick: NEGATIVE
KETONES UR: NEGATIVE mg/dL
Nitrite: NEGATIVE
PROTEIN: NEGATIVE mg/dL
Specific Gravity, Urine: 1.006 (ref 1.005–1.030)
pH: 6 (ref 5.0–8.0)

## 2017-01-02 LAB — CBC
HCT: 38.5 % (ref 36.0–46.0)
HEMOGLOBIN: 12.8 g/dL (ref 12.0–15.0)
MCH: 28.1 pg (ref 26.0–34.0)
MCHC: 33.2 g/dL (ref 30.0–36.0)
MCV: 84.6 fL (ref 78.0–100.0)
Platelets: 228 10*3/uL (ref 150–400)
RBC: 4.55 MIL/uL (ref 3.87–5.11)
RDW: 13.9 % (ref 11.5–15.5)
WBC: 8 10*3/uL (ref 4.0–10.5)

## 2017-01-02 LAB — HCG, QUANTITATIVE, PREGNANCY: hCG, Beta Chain, Quant, S: <1 m[IU]/mL (ref <5)

## 2017-01-02 LAB — LIPASE, BLOOD: LIPASE: 53 U/L — AB (ref 11–51)

## 2017-01-02 MED ORDER — POTASSIUM CHLORIDE IN NACL 20-0.9 MEQ/L-% IV SOLN
Freq: Once | INTRAVENOUS | Status: AC
  Administered 2017-01-02: 09:00:00 via INTRAVENOUS
  Filled 2017-01-02 (×2): qty 1000

## 2017-01-02 MED ORDER — IBUPROFEN 400 MG PO TABS: 400.0000 mg | ORAL_TABLET | Freq: Once | ORAL | Status: DC | PRN

## 2017-01-02 MED ORDER — PROMETHAZINE HCL 25 MG PO TABS: 25.0000 mg | ORAL_TABLET | Freq: Four times a day (QID) | ORAL | 0 refills | Status: DC | PRN

## 2017-01-02 MED ORDER — DICYCLOMINE HCL 20 MG PO TABS: 20.0000 mg | ORAL_TABLET | Freq: Two times a day (BID) | ORAL | 0 refills | Status: DC

## 2017-01-02 MED ORDER — SODIUM CHLORIDE 0.9 % IV BOLUS (SEPSIS)
1000.0000 mL | Freq: Once | INTRAVENOUS | Status: AC
Start: 2017-01-02 — End: 2017-01-02
  Administered 2017-01-02: 1000 mL via INTRAVENOUS

## 2017-01-02 MED ORDER — OXYCODONE-ACETAMINOPHEN 5-325 MG PO TABS
ORAL_TABLET | ORAL | Status: AC
  Filled 2017-01-02: qty 1

## 2017-01-02 MED ORDER — ONDANSETRON 4 MG PO TBDP
ORAL_TABLET | ORAL | Status: AC
  Administered 2017-01-02: 4 mg
  Filled 2017-01-02: qty 1

## 2017-01-02 MED ORDER — HALOPERIDOL LACTATE 5 MG/ML IJ SOLN
2.5000 mg | Freq: Once | INTRAMUSCULAR | Status: AC
  Administered 2017-01-02: 2.5 mg via INTRAVENOUS
  Filled 2017-01-02: qty 1

## 2017-01-02 MED ORDER — OXYCODONE-ACETAMINOPHEN 5-325 MG PO TABS
1.0000 | ORAL_TABLET | ORAL | Status: DC | PRN
  Administered 2017-01-02: 1 via ORAL

## 2017-01-02 MED ORDER — TRAMADOL HCL 50 MG PO TABS: 50.0000 mg | ORAL_TABLET | Freq: Four times a day (QID) | ORAL | 0 refills | Status: DC | PRN

## 2017-01-02 MED ORDER — FENTANYL CITRATE (PF) 100 MCG/2ML IJ SOLN
50.0000 ug | Freq: Once | INTRAMUSCULAR | Status: AC
  Administered 2017-01-02: 50 ug via INTRAVENOUS
  Filled 2017-01-02: qty 2

## 2017-01-02 NOTE — ED Notes (Signed)
Pt friend up to [facility] stating that she needs pain medicine, informed pt I could take her to triage and we can try Zofran ODT for nausea and if that helps subside nausea then we can try pain medicine. Pt told this RN during triage that everything she has attempted to eat/drink has immediately come up so if I given pain medicine she will throw it up which is why I want to treat the nausea first. 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 Once again informed the pt and her friend that in order for her to keep down pain medicine she must have her nausea controlled first. Also please note that pt refused Zofran during triage b/c she states "it wont work"

## 2017-01-02 NOTE — ED Notes (Signed)
Patient ambulated to restroom without assistance. Gait slow, steady. No distress noted.  

## 2017-01-02 NOTE — ED Triage Notes (Signed)
Pt reports L sided abd pain present for few days with N/V/D. Pt unable to describe the pain but rates it 10/10. Pt seen at [facility] for same, was given Zofran but states it didn't help. Pt refused Zofran in triage.

## 2017-01-02 NOTE — ED Notes (Signed)
ED Provider at bedside. 

## 2017-01-02 NOTE — ED Notes (Signed)
Pt states she doesn't know if her nausea is better... Gave her the pain medicine anyways bc they continue to yell at this RN. Pt is wailing in triage room when I'm not in there but as soon as I come in the room and talk to her pt is laughing and talking with no wailing noted.Peggy KitchenMarland [name]

## 2017-01-02 NOTE — ED Notes (Signed)
Patient c/o vomiting onset Thurs. States she had 3 episodes of diarrhea yest. Was seen by her PCP on Monday was given phenergan and zofran of which hasn't helped. States her PCP told her this was stress induced. C/o abd. Pain onset Sunday left lower quad with radiation into her left flank. C/o decreased urine outpt.

## 2017-01-02 NOTE — ED Provider Notes (Signed)
[facility]-EMERGENCY DEPT Provider Note   CSN: [id number] Arrival date & time: 01/02/17  0032     History   Chief Complaint Chief Complaint  Patient presents with  . Multiple Complaints    HPI Peggy [name] is a 31 y.o. female who presents to the emergency department with chief complaint of abdominal pain, nausea, vomiting and vomiting and diarrhea. She states the onset of her vomiting started around 3:00 in the morning Friday, 21 September, 2018. She states that she has been unable hold "anything down" since that time. She states that she would try to take Zofran or Phenergan however she has had continuing vomiting. She saw her primary care physician and an urgent care. She has had localizing pain on the left upper and lower quadrants of her abdomen. 2 days ago she developed 3 episodes of watery stool. She denies any ingestion of suspicious foods, recent foreign travel. She denies history of kidney stones, urinary symptoms, back pain. She rates her pain at "11 out of 10."  She describes her pain as constant, intermittently crampy. She denies fevers.  HPI  Past Medical History:  Diagnosis Date  . ADD (attention deficit disorder)   . Complete miscarriage 09-12-06  . Factor V deficiency (HCC)   . LGSIL (low grade squamous intraepithelial dysplasia) 12-07 &3-08   C&B 3-08/CONDYLOMA TREATED 06-19-06  . Ovarian cyst   . Pulmonary embolus [facility])    age 88    Patient Active Problem List   Diagnosis Date Noted  . Elevated LFTs 12/27/2014  . Choledocholithiasis 12/25/2014  . Pregnancy 10/04/2013  . Postpartum state 10/04/2013  . ADD (attention deficit disorder)     Past Surgical History:  Procedure Laterality Date  . AUGMENTATION MAMMAPLASTY  08/2009  . CHOLECYSTECTOMY N/A 12/27/2014   Procedure: LAPAROSCOPIC CHOLECYSTECTOMY ;  Surgeon: Gaynelle Adu, MD;  Location: [facility] OR;  Service: General;  Laterality: N/A;  . ERCP N/A 12/26/2014   Procedure: ENDOSCOPIC RETROGRADE CHOLANGIOPANCREATOGRAPHY  (ERCP);  Surgeon: Dorena Cookey, MD;  Location: [facility] OR;  Service: Gastroenterology;  Laterality: N/A;  . INCISE AND DRAIN ABCESS  11-16-09   LEFT LABIA  . TONSILLECTOMY    . WISDOM TOOTH EXTRACTION      OB History    Gravida Para Term Preterm AB Living   SAB TAB Ectopic Multiple Live Births   1     0 2       Home Medications    Prior to Admission medications   Medication Sig Start Date End Date Taking? Authorizing Provider  amphetamine-dextroamphetamine (ADDERALL) 10 MG tablet Take 10 mg by mouth 2 (two) times daily with a meal.   Yes [provider]  diphenoxylate-atropine (LOMOTIL) 2.5-0.025 MG tablet Take 1 tablet by mouth every 6 (six) hours.   Yes [provider]  enoxaparin (LOVENOX) 60 MG/0.6ML injection Inject 60 mg into the skin daily.   Yes [provider]  ondansetron (ZOFRAN) 8 MG tablet Take 8 mg by mouth every 8 (eight) hours as needed for nausea or vomiting.   Yes [provider]  docusate sodium (COLACE) 100 MG capsule Take 1 capsule (100 mg total) by mouth 2 (two) times daily. Patient not taking: Reported on 01/02/2017 10/03/15   Waynard Reeds, MD  enoxaparin (LOVENOX) 40 MG/0.4ML injection Inject 0.4 mLs (40 mg total) into the skin every 12 (twelve) hours. Patient not taking: Reported on 01/02/2017 12/30/14   Albertine Grates, MD  HYDROcodone-acetaminophen (NORCO/VICODIN) [phone]  MG tablet Take 1 tablet by mouth every 6 (six) hours as needed for moderate pain. Patient not taking: Reported on 01/02/2017 07/20/16   Marny Lowenstein, PA-C  ibuprofen (ADVIL,MOTRIN) 600 MG tablet Take 1 tablet (600 mg total) by mouth every 6 (six) hours as needed. Patient not taking: Reported on 01/02/2017 10/03/15   Waynard Reeds, MD  ondansetron (ZOFRAN) 4 MG tablet Take 1 tablet (4 mg total) by mouth every 6 (six) hours. Patient not taking: Reported on 01/02/2017 07/20/16   Marny Lowenstein, PA-C    Family History Family History  Problem Relation Age of  Onset  . Cancer Maternal Grandmother        skin  . Diabetes Maternal Grandmother   . Hypertension Maternal Grandfather   . Breast cancer Paternal Grandmother   . Cancer Paternal Grandfather        ? colon    Social History Social History  Substance Use Topics  . Smoking status: Current Some Day Smoker    Packs/day: 0.25  . Smokeless tobacco: Never Used  . Alcohol use Yes     Comment: once a week     Allergies   Caffeine and Peanut butter flavor   Review of Systems Review of Systems   Physical Exam Updated Vital Signs BP 98/66   Pulse 63   Temp 98.1 F (36.7 C)   Resp 18   Ht  (1.778 m)   Wt 68 kg (150 lb)   LMP 12/11/2016 (Approximate)   SpO2 99%   BMI 21.52 kg/m   Physical Exam  Constitutional: She is oriented to person, place, and time. She appears well-developed and well-nourished. No distress.  HENT:  Head: Normocephalic and atraumatic.  Eyes: Conjunctivae are normal. No scleral icterus.  Neck: Normal range of motion.  Cardiovascular: Normal rate, regular rhythm and normal heart sounds.  Exam reveals no gallop and no friction rub.   No murmur heard. Pulmonary/Chest: Effort normal and breath sounds normal. No respiratory distress.  Abdominal: Soft. Bowel sounds are normal. She exhibits no distension and no mass. There is tenderness. There is no guarding.  Mild diffuse tenderness worse in the left upper and lower quadrant  Neurological: She is alert and oriented to person, place, and time.  Skin: Skin is warm and dry. She is not diaphoretic.  Psychiatric: Her behavior is normal.  Nursing note and vitals reviewed.    ED Treatments / Results  Labs (all labs ordered are listed, but only abnormal results are displayed) Labs Reviewed  LIPASE, BLOOD - Abnormal; Notable for the following:       Result Value   Lipase 53 (*)    All other components within normal limits  COMPREHENSIVE METABOLIC PANEL - Abnormal; Notable for the following:     Potassium 2.8 (*)    BUN <5 (*)    Calcium 8.5 (*)    Total Protein 6.1 (*)    AST 54 (*)    ALT 110 (*)    All other components within normal limits  URINALYSIS, ROUTINE W REFLEX MICROSCOPIC - Abnormal; Notable for the following:    APPearance HAZY (*)    Leukocytes, UA SMALL (*)    Bacteria, UA RARE (*)    Squamous Epithelial / LPF 6-30 (*)    All other components within normal limits  GASTROINTESTINAL PANEL BY PCR, STOOL (REPLACES STOOL CULTURE)  CBC  HCG, QUANTITATIVE, PREGNANCY  RAPID URINE DRUG SCREEN, HOSP PERFORMED  HEPATITIS PANEL, ACUTE  OCCULT BLOOD  GASTRIC / DUODENUM (SPECIMEN CUP)  I-STAT TROPONIN, ED    EKG  EKG Interpretation  Date/Time:  Wednesday January 02 2017 01:23:59 EDT Ventricular Rate:  79 PR Interval:  172 QRS Duration: 88 QT Interval:  352 QTC Calculation: 403 R Axis:   66 Text Interpretation:  Normal sinus rhythm Normal ECG When compared with ECG of 11/04/2014, No significant change was found Confirmed by Dione Booze ([id number]) on 01/02/2017 5:59:10 AM       Radiology Ct Abdomen Pelvis Wo Contrast  Result Date: 01/02/2017 CLINICAL DATA:  Abdominal pain with suspected diverticulitis. Pain is left lower quadrant and pelvic. Nausea and vomiting for 6 days. EXAM: CT ABDOMEN AND PELVIS WITHOUT CONTRAST TECHNIQUE: Multidetector CT imaging of the abdomen and pelvis was performed following the standard protocol without IV contrast. COMPARISON:  12/25/2014 FINDINGS: Lower chest: No acute finding. Partly seen bilateral breast implants. Hepatobiliary: No focal liver abnormality.Cholecystectomy which accounts for prominence of the common bile duct. Normal bilirubin today. Pancreas: Unremarkable. Spleen: Unremarkable. Adrenals/Urinary Tract: Negative adrenals. Punctate stone in the lower right kidney, nonobstructive. No hydronephrosis or suspected ureteral stone (there are multiple pelvic phleboliths and the lower ureters are difficult to discretely visualize, but  no secondary signs of ureteral calculi). Limited assessment of the bladder due to collapsed state Stomach/Bowel:  No obstruction or inflammatory changes. Vascular/Lymphatic: No vascular FINDINGS.  No mass or adenopathy. Reproductive:Bilateral adnexal cystic densities, measuring up to 3 cm on the left. Patient had pelvic ultrasound April 2018 Other: No ascites or pneumoperitoneum. Musculoskeletal: Mild lumbar dextroscoliosis, also seen previously. No acute or aggressive finding. IMPRESSION: 1. No acute finding. No evidence of diverticulitis or diverticulosis. 2. 3 cm left adnexal cyst with simple appearance. 3. Punctate right nephrolithiasis. Electronically Signed   By: Marnee Spring M.D.   On: 01/02/2017 08:09    Procedures Procedures (including critical care time)  Medications Ordered in ED Medications  oxyCODONE-acetaminophen (PERCOCET/ROXICET) 5-325 MG per tablet 1 tablet (1 tablet Oral Not Given 01/02/17 0907)  ondansetron (ZOFRAN-ODT) 4 MG disintegrating tablet (4 mg  Given 01/02/17 0236)  sodium chloride 0.9 % bolus 1,000 mL (1,000 mLs Intravenous New Bag/Given 01/02/17 0836)  0.9 % NaCl with KCl 20 mEq/ L  infusion ( Intravenous New Bag/Given 01/02/17 0903)  haloperidol lactate (HALDOL) injection 2.5 mg (2.5 mg Intravenous Given 01/02/17 0856)  fentaNYL (SUBLIMAZE) injection 50 mcg (50 mcg Intravenous Given 01/02/17 0900)     Initial Impression / Assessment and Plan / ED Course  I have reviewed the triage vital signs and the nursing notes.  Pertinent labs & imaging results that were available during my care of the patient were reviewed by me and considered in my medical decision making (see chart for details).     Patient is nontoxic, nonseptic appearing, in no apparent distress.  Patient's pain and other symptoms adequately managed in emergency department.  Fluid bolus given.  Labs, imaging and vitals reviewed.  Patient does not meet the SIRS or Sepsis criteria.  On repeat exam patient  does not have a surgical abdomin and there are no peritoneal signs.  No indication of appendicitis, bowel obstruction, bowel perforation, cholecystitis, diverticulitis, PID or ectopic pregnancy.  Patient discharged home with symptomatic treatment and given strict instructions for follow-up with their primary care physician.  I have also discussed reasons to return immediately to the ER.  Patient expresses understanding and agrees with plan.     Final Clinical Impressions(s) / ED Diagnoses   Final diagnoses:  Non-intractable vomiting, presence  of nausea not specified, unspecified vomiting type  Hypokalemia  Left upper quadrant pain    New Prescriptions New Prescriptions   No medications on file     Arthor Captain, PA-C 01/05/17 1610    Mancel Bale, MD 01/07/17 1036

## 2017-01-02 NOTE — ED Notes (Signed)
Friend reports that patient is now having CP. patient taken to triage for EKG.

## 2017-01-02 NOTE — Discharge Instructions (Signed)

## 2017-01-03 LAB — HEPATITIS PANEL, ACUTE
HEP A IGM: NEGATIVE
HEP B C IGM: NEGATIVE
Hepatitis B Surface Ag: NEGATIVE

## 2017-01-09 ENCOUNTER — Ambulatory Visit (INDEPENDENT_AMBULATORY_CARE_PROVIDER_SITE_OTHER): Payer: Medicaid Other | Admitting: Obstetrics and Gynecology

## 2017-01-09 ENCOUNTER — Encounter: Payer: Self-pay | Admitting: Obstetrics and Gynecology

## 2017-01-09 VITALS — BP 100/68 | HR 108 | Ht 70.0 in | Wt 149.0 lb

## 2017-01-09 DIAGNOSIS — F329 Major depressive disorder, single episode, unspecified: Secondary | ICD-10-CM | POA: Insufficient documentation

## 2017-01-09 DIAGNOSIS — Z7901 Long term (current) use of anticoagulants: Secondary | ICD-10-CM

## 2017-01-09 DIAGNOSIS — N939 Abnormal uterine and vaginal bleeding, unspecified: Secondary | ICD-10-CM | POA: Diagnosis not present

## 2017-01-09 DIAGNOSIS — M797 Fibromyalgia: Secondary | ICD-10-CM | POA: Insufficient documentation

## 2017-01-09 DIAGNOSIS — Z6835 Body mass index (BMI) 35.0-35.9, adult: Secondary | ICD-10-CM

## 2017-01-09 DIAGNOSIS — Z86711 Personal history of pulmonary embolism: Secondary | ICD-10-CM | POA: Diagnosis not present

## 2017-01-09 DIAGNOSIS — N946 Dysmenorrhea, unspecified: Secondary | ICD-10-CM | POA: Diagnosis not present

## 2017-01-09 NOTE — Progress Notes (Signed)
Patient ID: Peggy [name], female   DOB: [birth year], 31 y.o.   MRN: [id number]   [facility] Clinic Visit  @            Patient name: Peggy [name] MRN [id number]  Date of birth: Oct 08, 1985  CC & HPI:  Peggy [name] is a 31 y.o. female presenting today for abdominal pain that started about 12 days ago. She initially went to her PCP but did not agree with what they said. On 01/02/17 her pain became severe and she went to [facility] ED. She was given pain medication with moderate relief. Pt reports that she still has pain, but it is not as severe. Associated symptoms include heavy vaginal bleeding since 01/03/17. Her menstrual cycles have never been regular and she notes that they are always painful with heavy bleeding and large blood clots. Menarche: 31 years old.  She is currently not on any form of birth control and notes that her husband was fixed. Pt reports having only tried Nuva ring when she was around 18 and the implant after that. Hx of PE at 31 years old and at 31 years old. Her second PE occurred while on the birth control implant. Pt is on permanent anticoagulation therapy. Hx of cyst. Pt notes her grandmother died of cervical cancer. Pt asked about an ablation. Otherwise, pt denies any other associated symptoms at this time.  ROS:  ROS (+) abdominal pain All systems are negative except as noted in the HPI and PMH.   Pertinent History Reviewed:   Reviewed: Significant for ovarian cyst Medical         Past Medical History:  Diagnosis Date  . ADD (attention deficit disorder)   . Complete miscarriage 09-12-06  . Factor V deficiency (HCC)   . LGSIL (low grade squamous intraepithelial dysplasia) 12-07 &3-08   C&B 3-08/CONDYLOMA TREATED 06-19-06  . Ovarian cyst   . Pulmonary embolus [facility])    age 71                              Surgical Hx:    Past Surgical History:  Procedure Laterality Date  . AUGMENTATION MAMMAPLASTY  08/2009  . CHOLECYSTECTOMY N/A 12/27/2014   Procedure:  LAPAROSCOPIC CHOLECYSTECTOMY ;  Surgeon: Gaynelle Adu, MD;  Location: [facility] OR;  Service: General;  Laterality: N/A;  . ERCP N/A 12/26/2014   Procedure: ENDOSCOPIC RETROGRADE CHOLANGIOPANCREATOGRAPHY (ERCP);  Surgeon: Dorena Cookey, MD;  [facility] OR;  Service: Gastroenterology;  Laterality: N/A;  . INCISE AND DRAIN ABCESS  11-16-09   LEFT LABIA  . TONSILLECTOMY    . WISDOM TOOTH EXTRACTION     Medications: Reviewed & Updated - see associated section                       Current Outpatient Prescriptions:  .  amphetamine-dextroamphetamine (ADDERALL) 10 MG tablet, Take 10 mg by mouth 2 (two) times daily with a meal., Disp: , Rfl:  .  dicyclomine (BENTYL) 20 MG tablet, Take 1 tablet (20 mg total) by mouth 2 (two) times daily., Disp: 20 tablet, Rfl: 0 .  diphenoxylate-atropine (LOMOTIL) 2.5-0.025 MG tablet, Take 1 tablet by mouth every 6 (six) hours., Disp: , Rfl:  .  docusate sodium (COLACE) 100 MG capsule, Take 1 capsule (100 mg total) by mouth 2 (two) times daily. (Patient not taking: Reported on 01/02/2017), Disp: 60 capsule, Rfl: 0 .  enoxaparin (  LOVENOX) 40 MG/0.4ML injection, Inject 0.4 mLs (40 mg total) into the skin every 12 (twelve) hours. (Patient not taking: Reported on 01/02/2017), Disp: 60 Syringe, Rfl: 3 .  enoxaparin (LOVENOX) 60 MG/0.6ML injection, Inject 60 mg into the skin daily., Disp: , Rfl:  .  HYDROcodone-acetaminophen (NORCO/VICODIN) 5-325 MG tablet, Take 1 tablet by mouth every 6 (six) hours as needed for moderate pain. (Patient not taking: Reported on 01/02/2017), Disp: 5 tablet, Rfl: 0 .  ibuprofen (ADVIL,MOTRIN) 600 MG tablet, Take 1 tablet (600 mg total) by mouth every 6 (six) hours as needed. (Patient not taking: Reported on 01/02/2017), Disp: 90 tablet, Rfl: 0 .  ondansetron (ZOFRAN) 4 MG tablet, Take 1 tablet (4 mg total) by mouth every 6 (six) hours. (Patient not taking: Reported on 01/02/2017), Disp: 12 tablet, Rfl: 0 .  ondansetron (ZOFRAN) 8 MG tablet, Take 8 mg by mouth  every 8 (eight) hours as needed for nausea or vomiting., Disp: , Rfl:  .  promethazine (PHENERGAN) 25 MG tablet, Take 1 tablet (25 mg total) by mouth every 6 (six) hours as needed for nausea or vomiting., Disp: 12 tablet, Rfl: 0 .  traMADol (ULTRAM) 50 MG tablet, Take 1 tablet (50 mg total) by mouth every 6 (six) hours as needed., Disp: 15 tablet, Rfl: 0   Social History: Reviewed -  reports that she has been smoking.  She has been smoking about 0.25 packs per day. She has never used smokeless tobacco.  Objective Findings:  Vitals: Blood pressure 100/68, pulse (!) 108, height  (1.778 m), weight 149 lb (67.6 kg), last menstrual period 01/07/2017, unknown if currently breastfeeding.  Physical Examination: General appearance - alert, well appearing, and in no distress and oriented to person, place, and time Mental status - alert, oriented to person, place, and time, normal mood, behavior, speech, dress, motor activity, and thought processes, affect appropriate to mood   Assessment & Plan:   A:  1. AUB 2. 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 Dysmenorrhea  P:  1. F/U 1 week pelvic U/S, GYN physical exam 2. Was given endometrial ablation info   By signing my name below, I, Diona Browner, attest that this documentation has been prepared under the direction and in the presence of Tilda Burrow, MD. Electronically Signed: Diona Browner, Medical Scribe. 01/09/17. 1:20 PM.  I personally performed the services described in this documentation, which was SCRIBED in my presence. The recorded information has been reviewed and considered accurate. It has been edited as necessary during review. Tilda Burrow, MD

## 2017-01-15 ENCOUNTER — Other Ambulatory Visit: Payer: Self-pay | Admitting: Obstetrics and Gynecology

## 2017-01-15 DIAGNOSIS — N83201 Unspecified ovarian cyst, right side: Secondary | ICD-10-CM

## 2017-01-15 DIAGNOSIS — R102 Pelvic and perineal pain: Secondary | ICD-10-CM

## 2017-01-16 ENCOUNTER — Other Ambulatory Visit: Payer: Self-pay | Admitting: Obstetrics and Gynecology

## 2017-01-16 ENCOUNTER — Ambulatory Visit (INDEPENDENT_AMBULATORY_CARE_PROVIDER_SITE_OTHER): Payer: Medicaid Other

## 2017-01-16 ENCOUNTER — Ambulatory Visit (INDEPENDENT_AMBULATORY_CARE_PROVIDER_SITE_OTHER): Payer: Medicaid Other | Admitting: Obstetrics and Gynecology

## 2017-01-16 ENCOUNTER — Encounter: Payer: Self-pay | Admitting: Obstetrics and Gynecology

## 2017-01-16 VITALS — BP 102/70 | HR 84 | Ht 70.0 in | Wt 148.0 lb

## 2017-01-16 DIAGNOSIS — Z7901 Long term (current) use of anticoagulants: Secondary | ICD-10-CM

## 2017-01-16 DIAGNOSIS — N83202 Unspecified ovarian cyst, left side: Secondary | ICD-10-CM | POA: Diagnosis not present

## 2017-01-16 DIAGNOSIS — N939 Abnormal uterine and vaginal bleeding, unspecified: Secondary | ICD-10-CM

## 2017-01-16 DIAGNOSIS — R102 Pelvic and perineal pain: Secondary | ICD-10-CM

## 2017-01-16 DIAGNOSIS — Z86711 Personal history of pulmonary embolism: Secondary | ICD-10-CM | POA: Diagnosis not present

## 2017-01-16 DIAGNOSIS — N83201 Unspecified ovarian cyst, right side: Secondary | ICD-10-CM

## 2017-01-16 NOTE — Progress Notes (Signed)
PELVIC US TA/TV: homogeneous anteverted uterus,wnl,EEC 6.6 mm,normal right ovary w/a simple dominate follicle 2.2 x 2.2 x 1.8 cm,complex left ovarian cyst w/low level echos and a thick septation 4.3 x 4.8 x 4 cm,venous and arterial flow visualized in left ovary,no free fluid,left adnexal pain

## 2017-01-16 NOTE — Progress Notes (Signed)
[facility] Clinic Visit  01/16/2017            Patient name: Peggy [name] MRN [id number]  Date of birth: 1985/06/02  CC & HPI:  Peggy [name] is a 31 y.o. female presenting today for f/u US for her sudden onset abdominal pain about 3 weeks ago. Pt had gone to [facility] ED on 01/02/17 for severe pain. Associated symptoms included heavy vaginal bleeding. No alleviating factors noted. Pt does not use birth control. Patient's last menstrual period was 01/07/2017.    ROS:  ROS  +abdominal pain +AUB -fever All systems are negative except as noted in the HPI and PMH.  Bm"s normal not affecting pain    Pertinent History Reviewed:   Reviewed: Significant for PEx2, ovarian cysts, LGSIL Medical         Past Medical History:  Diagnosis Date   ADD (attention deficit disorder)    Complete miscarriage 09-12-06   Factor V deficiency (HCC)    LGSIL (low grade squamous intraepithelial dysplasia) 12-07 &3-08   C&B 3-08/CONDYLOMA TREATED 06-19-06   Ovarian cyst    Pulmonary embolus [facility])    age 73                              Surgical Hx:    Past Surgical History:  Procedure Laterality Date   AUGMENTATION MAMMAPLASTY  08/2009   CHOLECYSTECTOMY N/A 12/27/2014   Procedure: LAPAROSCOPIC CHOLECYSTECTOMY ;  Surgeon: Gaynelle Adu, MD;  Location: [facility] OR;  Service: General;  Laterality: N/A;   ERCP N/A 12/26/2014   Procedure: ENDOSCOPIC RETROGRADE CHOLANGIOPANCREATOGRAPHY (ERCP);  Surgeon: Dorena Cookey, MD;  Location: [facility] OR;  Service: Gastroenterology;  Laterality: N/A;   INCISE AND DRAIN ABCESS  11-16-09   LEFT LABIA   TONSILLECTOMY     WISDOM TOOTH EXTRACTION     Medications: Reviewed & Updated - see associated section                       Current Outpatient Prescriptions:    amphetamine-dextroamphetamine (ADDERALL) 10 MG tablet, Take 10 mg by mouth 2 (two) times daily with a meal., Disp: , Rfl:    enoxaparin (LOVENOX) 60 MG/0.6ML injection, Inject 60 mg into the skin daily. , Disp: ,  Rfl:    Social History: Reviewed -  reports that she has been smoking.  She has been smoking about 0.25 packs per day. She has never used smokeless tobacco.  Objective Findings:  Vitals: Blood pressure 102/70, pulse 84, height  (1.778 m), weight 148 lb (67.1 kg), last menstrual period 01/07/2017, not currently breastfeeding.  Physical Examination: General appearance - alert, well appearing, and in no distress Mental status - alert, oriented to person, place, and time Pelvic - not indicated  U/S:  Left ovary: 3.9 cm x 5.6 cm x 4.2 cm (per U/S) Right ovary: small cyst  Discussion: 1. Discussed with pt risks and benefits of endometrial ablation and hysteroscopy. Today's U/S results were also discussed and cysts on left and right ovaries noted.  At end of discussion, pt had opportunity to ask questions and has no further questions at this time.   Specific discussion as noted above. Greater than 50% was spent in counseling and coordination of care with the patient.   Total time greater than: 25 minutes.     Assessment & Plan:   A:  1. AUB 2. Hx of PE x2  on permeant anticoagulation therapy 3. Dysmenorrhea  P:  1. Schedule endometrial ablation for Nov 6th hysteroscopy and endometrial ablation 2. Schedule pre-op visit    By signing my name below, I, Izna Ahmed, attest that this documentation has been prepared under the direction and in the presence of Tilda Burrow, MD. Electronically Signed: Redge Gainer, Medical Scribe. 01/16/17. 3:17 PM.  I personally performed the services described in this documentation, which was SCRIBED in my presence. The recorded information has been reviewed and considered accurate. It has been edited as necessary during review. Tilda Burrow, MD

## 2017-01-23 ENCOUNTER — Encounter: Payer: Self-pay | Admitting: Obstetrics and Gynecology

## 2017-01-23 ENCOUNTER — Ambulatory Visit (INDEPENDENT_AMBULATORY_CARE_PROVIDER_SITE_OTHER): Payer: Medicaid Other | Admitting: Obstetrics and Gynecology

## 2017-01-23 DIAGNOSIS — Z7901 Long term (current) use of anticoagulants: Secondary | ICD-10-CM

## 2017-01-23 DIAGNOSIS — Z86711 Personal history of pulmonary embolism: Secondary | ICD-10-CM | POA: Diagnosis not present

## 2017-01-23 DIAGNOSIS — N939 Abnormal uterine and vaginal bleeding, unspecified: Secondary | ICD-10-CM | POA: Diagnosis not present

## 2017-01-23 DIAGNOSIS — Z01818 Encounter for other preprocedural examination: Secondary | ICD-10-CM

## 2017-01-23 NOTE — Progress Notes (Signed)
Patient ID: Peggy [name], female   DOB: 06/12/1985, 31 y.o.   MRN: [id number] Preoperative History and Physical  Peggy LocksKrista D Gilbert is a 31 y.o. [id number] here for surgical management of AUB. Last menDoristine Locksstrual cycle was 01/07/2017. Pt reports her husband has had a vasectomy No significant preoperative concerns.  Proposed surgery: DILATATION & CURETTAGE/HYSTEROSCOPY WITH NOVASURE ABLATION  Past Medical History:  Diagnosis Date  . ADD (attention deficit disorder)   . Complete miscarriage 09-12-06  . Factor V deficiency (HCC)   . LGSIL (low grade squamous intraepithelial dysplasia) 12-07 &3-08   C&B 3-08/CONDYLOMA TREATED 06-19-06  . Ovarian cyst   . Pulmonary embolus [facility](HCC)    age 31   Past Surgical History:  Procedure Laterality Date  . AUGMENTATION MAMMAPLASTY  08/2009  . CHOLECYSTECTOMY N/A 12/27/2014   Procedure: LAPAROSCOPIC CHOLECYSTECTOMY ;  Surgeon: Gaynelle AduEric Wilson, MD;  [facility];  Service: General;  Laterality: N/A;  . ERCP N/A 12/26/2014   Procedure: ENDOSCOPIC RETROGRADE CHOLANGIOPANCREATOGRAPHY (ERCP);  Surgeon: Dorena CookeyJohn Hayes, MD;  [facility];  Service: Gastroenterology;  Laterality: N/A;  . INCISE AND DRAIN ABCESS  11-16-09   LEFT LABIA  . TONSILLECTOMY    . WISDOM TOOTH EXTRACTION     OB History  Gravida Para Term Preterm AB Living  4 3 3   1 3   SAB TAB Ectopic Multiple Live Births  1     0 3    # Outcome Date GA Lbr Len/2nd Weight Sex Delivery Anes PTL Lv  4 Term 10/02/15 5684w2d 02:14 / 00:02 6 lb 11.2 oz (3.039 kg) M Vag-Spont EPI  LIV  3 Term 10/04/13 4214w6d 03:05 / 00:21 7 lb 7.4 oz (3.385 kg) F Vag-Spont EPI    2 Term 2009 3637w0d  6 lb 9 oz (2.977 kg) F Vag-Spont   LIV  1 SAB 2007            Patient denies any other pertinent gynecologic issues.   Current Outpatient Prescriptions on File Prior to Visit  Medication Sig Dispense Refill  . amphetamine-dextroamphetamine (ADDERALL) 10 MG tablet Take 10 mg by mouth 2 (two) times daily with a meal.    . enoxaparin (LOVENOX)  60 MG/0.6ML injection Inject 60 mg into the skin daily.      No current facility-administered medications on file prior to visit.    Allergies  Allergen Reactions  . Caffeine Anaphylaxis  . Peanut Butter Flavor Anaphylaxis    Social History:   reports that she has been smoking.  She has been smoking about 0.25 packs per day. She has never used smokeless tobacco. She reports that she drinks alcohol. She reports that she does not use drugs.  Family History  Problem Relation Age of Onset  . Diabetes Maternal Grandmother   . Hypertension Maternal Grandfather   . Cancer Maternal Grandfather        bladder  . Breast cancer Paternal Grandmother   . Cancer Paternal Grandmother        ovarian  . Cancer Paternal Grandfather        ? colon  . Asthma Father   . Thyroid disease Mother   . Thyroid disease Brother     Review of Systems: Noncontributory  PHYSICAL EXAM: Blood pressure 120/90, pulse 79, height 5\' 10"  (1.778 m), weight 149 lb 8 oz (67.8 kg), last menstrual period 01/07/2017, not currently breastfeeding. General appearance - alert, well appearing, and in no distress Chest - clear to auscultation, no wheezes, rales or rhonchi,  symmetric air entry Heart - normal rate and regular rhythm, no murmurs Abdomen - soft, nontender, nondistended, no masses or organomegaly Pelvic - examination, First degree uterine descensus, cervix is maltiporus Extremities - peripheral pulses normal, no pedal edema, no clubbing or cyanosis  Labs: No results found for this or any previous visit (from the past 336 hour(s)).  Imaging Studies: Ct Abdomen Pelvis Wo Contrast  Result Date: 01/02/2017 CLINICAL DATA:  Abdominal pain with suspected diverticulitis. Pain is left lower quadrant and pelvic. Nausea and vomiting for 6 days. EXAM: CT ABDOMEN AND PELVIS WITHOUT CONTRAST TECHNIQUE: Multidetector CT imaging of the abdomen and pelvis was performed following the standard protocol without IV contrast.  COMPARISON:  12/25/2014 FINDINGS: Lower chest: No acute finding. Partly seen bilateral breast implants. Hepatobiliary: No focal liver abnormality.Cholecystectomy which accounts for prominence of the common bile duct. Normal bilirubin today. Pancreas: Unremarkable. Spleen: Unremarkable. Adrenals/Urinary Tract: Negative adrenals. Punctate stone in the lower right kidney, nonobstructive. No hydronephrosis or suspected ureteral stone (there are multiple pelvic phleboliths and the lower ureters are difficult to discretely visualize, but no secondary signs of ureteral calculi). Limited assessment of the bladder due to collapsed state Stomach/Bowel:  No obstruction or inflammatory changes. Vascular/Lymphatic: No vascular FINDINGS.  No mass or adenopathy. Reproductive:Bilateral adnexal cystic densities, measuring up to 3 cm on the left. Patient had pelvic ultrasound April 2018 Other: No ascites or pneumoperitoneum. Musculoskeletal: Mild lumbar dextroscoliosis, also seen previously. No acute or aggressive finding. IMPRESSION: 1. No acute finding. No evidence of diverticulitis or diverticulosis. 2. 3 cm left adnexal cyst with simple appearance. 3. Punctate right nephrolithiasis. Electronically Signed   By: Marnee Spring M.D.   On: 01/02/2017 08:09    Assessment: 1. AUB 2. 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 Dysmenorrhea Patient Active Problem List   Diagnosis Date Noted  . Fibromyalgia syndrome 01/09/2017  . Severe obesity (BMI 35.0-35.9 with comorbidity) (HCC) 01/09/2017  . Reactive depression (situational) 01/09/2017  . Elevated LFTs 12/27/2014  . Choledocholithiasis 12/25/2014  . Pregnancy 10/04/2013  . Postpartum state 10/04/2013  . ADD (attention deficit disorder)     Plan: 1. Patient will undergo surgical management with DILATATION & CURETTAGE/HYSTEROSCOPY WITH NOVASURE ABLATION on 02/12/2017.  Risks, rationale for surgery, potential complications discussed including risks of uterine  perforation, injury to adjacent organs, or long term risks such as incomplete resolution of bleeding or cervical stenosis. 2. Pt will temporarily stop her Lovenox 24 hours prior to surgery and start back up 24 hours after surgery.  3. F/u 4 weeks for post-op   .mec 01/23/2017 11:16 AM   By signing my name below, I, Diona Browner, attest that this documentation has been prepared under the direction and in the presence of Tilda Burrow, MD. Electronically Signed: Diona Browner, Medical Scribe. 01/23/17. 11:19 AM.  I personally performed the services described in this documentation, which was SCRIBED in my presence. The recorded information has been reviewed and considered accurate. It has been edited as necessary during review. Tilda Burrow, MD '

## 2017-01-26 LAB — GC/CHLAMYDIA PROBE AMP
CHLAMYDIA, DNA PROBE: NEGATIVE
NEISSERIA GONORRHOEAE BY PCR: NEGATIVE

## 2017-02-05 NOTE — Patient Instructions (Signed)
Peggy LocksKrista D [name]  02/05/2017     @PREFPERIOPPHARMACY @   Your procedure is scheduled on  02/12/2017 .  [facility]  [facility]   [facility].  Call this number if you have problems the morning of surgery:  [phone]   Remember:  Do not eat food or drink liquids after midnight.  Take these medicines the morning of surgery with A SIP OF WATER adderall.   Do not wear jewelry, make-up or nail polish.  Do not wear lotions, powders, or perfumes, or deoderant.  Do not shave 48 hours prior to surgery.  Men may shave face and neck.  Do not bring valuables to the hospital.  [facility] or [facility]uables.  Contacts, dentures or bridgework may not be worn into surgery.  Leave your suitcase in the car.  After surgery it may be brought to your room.  For patients admitted to the hospital, discharge time will be determined by your treatment team.  Patients discharged the day of surgery will not be allowed to drive home.   Name and phone number of your driver:   family Special instructions:  None  Please read over the following fact sheets that you were given. Anesthesia Post-op Instructions and Care and Recovery After Surgery      Hysteroscopy Hysteroscopy is a procedure used for looking inside the womb (uterus). It may be done for various reasons, including:  To evaluate abnormal bleeding, fibroid (benign, noncancerous) tumors, polyps, scar tissue (adhesions), and possibly cancer of the uterus.  To look for lumps (tumors) and other uterine growths.  To look for causes of why a woman cannot get pregnant (infertility), causes of recurrent loss of pregnancy (miscarriages), or a lost intrauterine device (IUD).  To perform a sterilization by blocking the fallopian tubes from inside the uterus.  In this procedure, a thin, flexible tube with a tiny light and camera on the end of it (hysteroscope) is used to look inside the  uterus. A hysteroscopy should be done right after a menstrual period to be sure you are not pregnant. LET [facility] KNOW ABOUT:  Any allergies you have.  All medicines you are taking, including vitamins, herbs, eye drops, creams, and over-the-counter medicines.  Previous problems you or members of your family have had with the use of anesthetics.  Any blood disorders you have.  Previous surgeries you have had.  Medical conditions you have. RISKS AND COMPLICATIONS Generally, this is a safe procedure. However, as with any procedure, complications can occur. Possible complications include:  Putting a hole in the uterus.  Excessive bleeding.  Infection.  Damage to the cervix.  Injury to other organs.  Allergic reaction to medicines.  Too much fluid used in the uterus for the procedure.  BEFORE THE PROCEDURE  Ask your health care provider about changing or stopping any regular medicines.  Do not take aspirin or blood thinners for 1 week before the procedure, or as directed by your health care provider. These can cause bleeding.  If you smoke, do not smoke for 2 weeks before the procedure.  In some cases, a medicine is placed in the cervix the day before the procedure. This medicine makes the cervix have a larger opening (dilate). This makes it easier for the instrument to be inserted into the uterus during the procedure.  Do not eat or drink  anything for at least 8 hours before the surgery.  Arrange for someone to take you home after the procedure. PROCEDURE  You may be given a medicine to relax you (sedative). You may also be given one of the following: ? A medicine that numbs the area around the cervix (local anesthetic). ? A medicine that makes you sleep through the procedure (general anesthetic).  The hysteroscope is inserted through the vagina into the uterus. The camera on the hysteroscope sends a picture to a TV screen. This gives the surgeon a good  view inside the uterus.  During the procedure, air or a liquid is put into the uterus, which allows the surgeon to see better.  Sometimes, tissue is gently scraped from inside the uterus. These tissue samples are sent to a lab for testing. What to expect after the procedure  If you had a general anesthetic, you may be groggy for a couple hours after the procedure.  If you had a local anesthetic, you will be able to go home as soon as you are stable and feel ready.  You may have some cramping. This normally lasts for a couple days.  You may have bleeding, which varies from light spotting for a few days to menstrual-like bleeding for 3-7 days. This is normal.  If your test results are not back during the visit, make an appointment with your health care provider to find out the results. This information is not intended to replace advice given to you by your health care provider. Make sure you discuss any questions you have with your health care provider. Document Released: 07/02/2000 Document Revised: 09/01/2015 Document Reviewed: 10/23/2012 Elsevier Interactive Patient Education  2017 [place]. Hysteroscopy, Care After Refer to this sheet in the next few weeks. These instructions provide you with information on caring for yourself after your procedure. Your health care provider may also give you more specific instructions. Your treatment has been planned according to current medical practices, but problems sometimes occur. Call your health care provider if you have any problems or questions after your procedure. What can I expect after the procedure? After your procedure, it is typical to have the following:  You may have some cramping. This normally lasts for a couple days.  You may have bleeding. This can vary from light spotting for a few days to menstrual-like bleeding for 3-7 days.  Follow these instructions at home:  Rest for the first 1-2 days after the procedure.  Only take  over-the-counter or prescription medicines as directed by your health care provider. Do not take aspirin. It can increase the chances of bleeding.  Take showers instead of baths for 2 weeks or as directed by your health care provider.  Do not drive for 24 hours or as directed.  Do not drink alcohol while taking pain medicine.  Do not use tampons, douche, or have sexual intercourse for 2 weeks or until your health care provider says it is okay.  Take your temperature twice a day for 4-5 days. Write it down each time.  Follow your health care provider's advice about diet, exercise, and lifting.  If you develop constipation, you may: ? Take a mild laxative if your health care provider approves. ? Add bran foods to your diet. ? Drink enough fluids to keep your urine clear or pale yellow.  Try to have someone with you or available to you for the first 24-48 hours, especially if you were given a general anesthetic.  Follow up  with your health care provider as directed. Contact a health care provider if:  You feel dizzy or lightheaded.  You feel sick to your stomach (nauseous).  You have abnormal vaginal discharge.  You have a rash.  You have pain that is not controlled with medicine. Get help right away if:  You have bleeding that is heavier than a normal menstrual period.  You have a fever.  You have increasing cramps or pain, not controlled with medicine.  You have new belly (abdominal) pain.  You pass out.  You have pain in the tops of your shoulders (shoulder strap areas).  You have shortness of breath. This information is not intended to replace advice given to you by your health care provider. Make sure you discuss any questions you have with your health care provider. Document Released: 01/14/2013 Document Revised: 09/01/2015 Document Reviewed: 10/23/2012 Elsevier Interactive Patient Education  2017 [place].  Dilation and Curettage or Vacuum  Curettage Dilation and curettage (D&C) and vacuum curettage are minor procedures. A D&C involves stretching (dilation) the cervix and scraping (curettage) the inside lining of the uterus (endometrium). During a D&C, tissue is gently scraped from the endometrium, starting from the top portion of the uterus down to the lowest part of the uterus (cervix). During a vacuum curettage, the lining and tissue in the uterus are removed with the use of gentle suction. Curettage may be performed to either diagnose or treat a problem. As a diagnostic procedure, curettage is performed to examine tissues from the uterus. A diagnostic curettage may be done if you have:  Irregular bleeding in the uterus.  Bleeding with the development of clots.  Spotting between menstrual periods.  Prolonged menstrual periods or other abnormal bleeding.  Bleeding after menopause.  No menstrual period (amenorrhea).  A change in size and shape of the uterus.  Abnormal endometrial cells discovered during a Pap test.  As a treatment procedure, curettage may be performed for the following reasons:  Removal of an IUD (intrauterine device).  Removal of retained placenta after giving birth.  Abortion.  Miscarriage.  Removal of endometrial polyps.  Removal of uncommon types of noncancerous lumps (fibroids).  Tell a health care provider about:  Any allergies you have, including allergies to prescribed medicine or latex.  All medicines you are taking, including vitamins, herbs, eye drops, creams, and over-the-counter medicines. This is especially important if you take any blood-thinning medicine. Bring a list of all of your medicines to your appointment.  Any problems you or family members have had with anesthetic medicines.  Any blood disorders you have.  Any surgeries you have had.  Your medical history and any medical conditions you have.  Whether you are pregnant or may be pregnant.  Recent vaginal  infections you have had.  Recent menstrual periods, bleeding problems you have had, and what form of birth control (contraception) you use. What are the risks? Generally, this is a safe procedure. However, problems may occur, including:  Infection.  Heavy vaginal bleeding.  Allergic reactions to medicines.  Damage to the cervix or other structures or organs.  Development of scar tissue (adhesions) inside the uterus, which can cause abnormal amounts of menstrual bleeding. This may make it harder to get pregnant in the future.  A hole (perforation) or puncture in the uterine wall. This is rare.  What happens before the procedure? Staying hydrated Follow instructions from your health care provider about hydration, which may include:  Up to 2 hours before the procedure -  you may continue to drink clear liquids, such as water, clear fruit juice, black coffee, and plain tea.  Eating and drinking restrictions Follow instructions from your health care provider about eating and drinking, which may include:  8 hours before the procedure - stop eating heavy meals or foods such as meat, fried foods, or fatty foods.  6 hours before the procedure - stop eating light meals or foods, such as toast or cereal.  6 hours before the procedure - stop drinking milk or drinks that contain milk.  2 hours before the procedure - stop drinking clear liquids. If your health care provider told you to take your medicine(s) on the day of your procedure, take them with only a sip of water.  Medicines  Ask your health care provider about: ? Changing or stopping your regular medicines. This is especially important if you are taking diabetes medicines or blood thinners. ? Taking medicines such as aspirin and ibuprofen. These medicines can thin your blood. Do not take these medicines before your procedure if your health care provider instructs you not to.  You may be given antibiotic medicine to help prevent  infection. General instructions  For 24 hours before your procedure, do not: ? Douche. ? Use tampons. ? Use medicines, creams, or suppositories in the vagina. ? Have sexual intercourse.  You may be given a pregnancy test on the day of the procedure.  Plan to have someone take you home from the hospital or clinic.  You may have a blood or urine sample taken.  If you will be going home right after the procedure, plan to have someone with you for 24 hours. What happens during the procedure?  To reduce your risk of infection: ? Your health care team will wash or sanitize their hands. ? Your skin will be washed with soap.  An IV tube will be inserted into one of your veins.  You will be given one of the following: ? A medicine that numbs the area in and around the cervix (local anesthetic). ? A medicine to make you fall asleep (general anesthetic).  You will lie down on your back, with your feet in foot rests (stirrups).  The size and position of your uterus will be checked.  A lubricated instrument (speculum or Sims retractor) will be inserted into the back side of your vagina. The speculum will be used to hold apart the walls of your vagina so your health care provider can see your cervix.  A tool (tenaculum) will be attached to the lip of the cervix to stabilize it.  Your cervix will be softened and dilated. This may be done by: ? Taking a medicine. ? Having tapered dilators or thin rods (laminaria) or gradual widening instruments (tapered dilators) inserted into your cervix.  A small, sharp, curved instrument (curette) will be used to scrape a small amount of tissue or cells from the endometrium or cervical canal. In some cases, gentle suction is applied with the curette. The curette will then be removed. The cells will be taken to a lab for testing. The procedure may vary among health care providers and hospitals. What happens after the procedure?  You may have mild  cramping, backache, pain, and light bleeding or spotting. You may pass small blood clots from your vagina.  You may have to wear compression stockings. These stockings help to prevent blood clots and reduce swelling in your legs.  Your blood pressure, heart rate, breathing rate, and blood oxygen level  will be monitored until the medicines you were given have worn off. Summary  Dilation and curettage (D&C) involves stretching (dilation) the cervix and scraping (curettage) the inside lining of the uterus (endometrium).  After the procedure, you may have mild cramping, backache, pain, and light bleeding or spotting. You may pass small blood clots from your vagina.  Plan to have someone take you home from the hospital or clinic. This information is not intended to replace advice given to you by your health care provider. Make sure you discuss any questions you have with your health care provider. Document Released: 03/26/2005 Document Revised: 12/11/2015 Document Reviewed: 12/11/2015 Elsevier Interactive Patient Education  2018 [employer].  Dilation and Curettage or Vacuum Curettage, Care After These instructions give you information about caring for yourself after your procedure. Your doctor may also give you more specific instructions. Call your doctor if you have any problems or questions after your procedure. Follow these instructions at home: Activity  Do not drive or use heavy machinery while taking prescription pain medicine.  For 24 hours after your procedure, avoid driving.  Take short walks often, followed by rest periods. Ask your doctor what activities are safe for you. After one or two days, you may be able to return to your normal activities.  Do not lift anything that is heavier than 10 lb (4.5 kg) until your doctor approves.  For at least 2 weeks, or as long as told by your doctor: ? Do not douche. ? Do not use tampons. ? Do not have sex. General instructions  Take  over-the-counter and prescription medicines only as told by your doctor. This is very important if you take blood thinning medicine.  Do not take baths, swim, or use a hot tub until your doctor approves. Take showers instead of baths.  Wear compression stockings as told by your doctor.  It is up to you to get the results of your procedure. Ask your doctor when your results will be ready.  Keep all follow-up visits as told by your doctor. This is important. Contact a doctor if:  You have very bad cramps that get worse or do not get better with medicine.  You have very bad pain in your belly (abdomen).  You cannot drink fluids without throwing up (vomiting).  You get pain in a different part of the area between your belly and thighs (pelvis).  You have bad-smelling discharge from your vagina.  You have a rash. Get help right away if:  You are bleeding a lot from your vagina. A lot of bleeding means soaking more than one sanitary pad in an hour, for 2 hours in a row.  You have clumps of blood (blood clots) coming from your vagina.  You have a fever or chills.  Your belly feels very tender or hard.  You have chest pain.  You have trouble breathing.  You cough up blood.  You feel dizzy.  You feel light-headed.  You pass out (faint).  You have pain in your neck or shoulder area. Summary  Take short walks often, followed by rest periods. Ask your doctor what activities are safe for you. After one or two days, you may be able to return to your normal activities.  Do not lift anything that is heavier than 10 lb (4.5 kg) until your doctor approves.  Do not take baths, swim, or use a hot tub until your doctor approves. Take showers instead of baths.  Contact your doctor if you  have any symptoms of infection, like bad-smelling discharge from your vagina. This information is not intended to replace advice given to you by your health care provider. Make sure you discuss any  questions you have with your health care provider. Document Released: 01/03/2008 Document Revised: 12/12/2015 Document Reviewed: 12/12/2015 Elsevier Interactive Patient Education  2017 [place].  Endometrial Ablation Endometrial ablation is a procedure that destroys the thin inner layer of the lining of the uterus (endometrium). This procedure may be done:  To stop heavy periods.  To stop bleeding that is causing anemia.  To control irregular bleeding.  To treat bleeding caused by small tumors (fibroids) in the endometrium.  This procedure is often an alternative to major surgery, such as removal of the uterus and cervix (hysterectomy). As a result of this procedure:  You may not be able to have children. However, if you are premenopausal (you have not gone through menopause): ? You may still have a small chance of getting pregnant. ? You will need to use a reliable method of birth control after the procedure to prevent pregnancy.  You may stop having a menstrual period, or you may have only a small amount of bleeding during your period. Menstruation may return several years after the procedure.  Tell a health care provider about:  Any allergies you have.  All medicines you are taking, including vitamins, herbs, eye drops, creams, and over-the-counter medicines.  Any problems you or family members have had with the use of anesthetic medicines.  Any blood disorders you have.  Any surgeries you have had.  Any medical conditions you have. What are the risks? Generally, this is a safe procedure. However, problems may occur, including:  A hole (perforation) in the uterus or bowel.  Infection of the uterus, bladder, or vagina.  Bleeding.  Damage to other structures or organs.  An air bubble in the lung (air embolus).  Problems with pregnancy after the procedure.  Failure of the procedure.  Decreased ability to diagnose cancer in the endometrium.  What happens  before the procedure?  You will have tests of your endometrium to make sure there are no pre-cancerous cells or cancer cells present.  You may have an ultrasound of the uterus.  You may be given medicines to thin the endometrium.  Ask your health care provider about: ? Changing or stopping your regular medicines. This is especially important if you take diabetes medicines or blood thinners. ? Taking medicines such as aspirin and ibuprofen. These medicines can thin your blood. Do not take these medicines before your procedure if your doctor tells you not to.  Plan to have someone take you home from the hospital or clinic. What happens during the procedure?  You will lie on an exam table with your feet and legs supported as in a pelvic exam.  To lower your risk of infection: ? Your health care team will wash or sanitize their hands and put on germ-free (sterile) gloves. ? Your genital area will be washed with soap.  An IV tube will be inserted into one of your veins.  You will be given a medicine to help you relax (sedative).  A surgical instrument with a light and camera (resectoscope) will be inserted into your vagina and moved into your uterus. This allows your surgeon to see inside your uterus.  Endometrial tissue will be removed using one of the following methods: ? Radiofrequency. This method uses a radiofrequency-alternating electric current to remove the endometrium. ? Cryotherapy.  This method uses extreme cold to freeze the endometrium. ? Heated-free liquid. This method uses a heated saltwater (saline) solution to remove the endometrium. ? Microwave. This method uses high-energy microwaves to heat up the endometrium and remove it. ? Thermal balloon. This method involves inserting a catheter with a balloon tip into the uterus. The balloon tip is filled with heated fluid to remove the endometrium. The procedure may vary among health care providers and hospitals. What happens  after the procedure?  Your blood pressure, heart rate, breathing rate, and blood oxygen level will be monitored until the medicines you were given have worn off.  As tissue healing occurs, you may notice vaginal bleeding for 4-6 weeks after the procedure. You may also experience: ? Cramps. ? Thin, watery vaginal discharge that is light pink or brown in color. ? A need to urinate more frequently than usual. ? Nausea.  Do not drive for 24 hours if you were given a sedative.  Do not have sex or insert anything into your vagina until your health care provider approves. Summary  Endometrial ablation is done to treat the many causes of heavy menstrual bleeding.  The procedure may be done only after medications have been tried to control the bleeding.  Plan to have someone take you home from the hospital or clinic. This information is not intended to replace advice given to you by your health care provider. Make sure you discuss any questions you have with your health care provider. Document Released: 02/03/2004 Document Revised: 04/12/2016 Document Reviewed: 04/12/2016 Elsevier Interactive Patient Education  2017 [place] Anesthesia, Adult General anesthesia is the use of medicines to make a person "go to sleep" (be unconscious) for a medical procedure. General anesthesia is often recommended when a procedure:  Is long.  Requires you to be still or in an unusual position.  Is major and can cause you to lose blood.  Is impossible to do without general anesthesia.  The medicines used for general anesthesia are called general anesthetics. In addition to making you sleep, the medicines:  Prevent pain.  Control your blood pressure.  Relax your muscles.  Tell a health care provider about:  Any allergies you have.  All medicines you are taking, including vitamins, herbs, eye drops, creams, and over-the-counter medicines.  Any problems you or family members have had  with anesthetic medicines.  Types of anesthetics you have had in the past.  Any bleeding disorders you have.  Any surgeries you have had.  Any medical conditions you have.  Any history of heart or lung conditions, such as heart failure, sleep apnea, or chronic obstructive pulmonary disease (COPD).  Whether you are pregnant or may be pregnant.  Whether you use tobacco, alcohol, marijuana, or street drugs.  Any history of Armed forces logistics/support/administrative officer.  Any history of depression or anxiety. What are the risks? Generally, this is a safe procedure. However, problems may occur, including:  Allergic reaction to anesthetics.  Lung and heart problems.  Inhaling food or liquids from your stomach into your lungs (aspiration).  Injury to nerves.  Waking up during your procedure and being unable to move (rare).  Extreme agitation or a state of mental confusion (delirium) when you wake up from the anesthetic.  Air in the bloodstream, which can lead to stroke.  These problems are more likely to develop if you are having a major surgery or if you have an advanced medical condition. You can prevent some of these complications by answering  all of your health care provider's questions thoroughly and by following all pre-procedure instructions. General anesthesia can cause side effects, including:  Nausea or vomiting  A sore throat from the breathing tube.  Feeling cold or shivery.  Feeling tired, washed out, or achy.  Sleepiness or drowsiness.  Confusion or agitation.  What happens before the procedure? Staying hydrated Follow instructions from your health care provider about hydration, which may include:  Up to 2 hours before the procedure - you may continue to drink clear liquids, such as water, clear fruit juice, black coffee, and plain tea.  Eating and drinking restrictions Follow instructions from your health care provider about eating and drinking, which may include:  8 hours  before the procedure - stop eating heavy meals or foods such as meat, fried foods, or fatty foods.  6 hours before the procedure - stop eating light meals or foods, such as toast or cereal.  6 hours before the procedure - stop drinking milk or drinks that contain milk.  2 hours before the procedure - stop drinking clear liquids.  Medicines  Ask your health care provider about: ? Changing or stopping your regular medicines. This is especially important if you are taking diabetes medicines or blood thinners. ? Taking medicines such as aspirin and ibuprofen. These medicines can thin your blood. Do not take these medicines before your procedure if your health care provider instructs you not to. ? Taking new dietary supplements or medicines. Do not take these during the week before your procedure unless your health care provider approves them.  If you are told to take a medicine or to continue taking a medicine on the day of the procedure, take the medicine with sips of water. General instructions   Ask if you will be going home the same day, the following day, or after a longer hospital stay. ? Plan to have someone take you home. ? Plan to have someone stay with you for the first 24 hours after you leave the hospital or clinic.  For 3-6 weeks before the procedure, try not to use any tobacco products, such as cigarettes, chewing tobacco, and e-cigarettes.  You may brush your teeth on the morning of the procedure, but make sure to spit out the toothpaste. What happens during the procedure?  You will be given anesthetics through a mask and through an IV tube in one of your veins.  You may receive medicine to help you relax (sedative).  As soon as you are asleep, a breathing tube may be used to help you breathe.  An anesthesia specialist will stay with you throughout the procedure. He or she will help keep you comfortable and safe by continuing to give you medicines and adjusting the amount  of medicine that you get. He or she will also watch your blood pressure, pulse, and oxygen levels to make sure that the anesthetics do not cause any problems.  If a breathing tube was used to help you breathe, it will be removed before you wake up. The procedure may vary among health care providers and hospitals. What happens after the procedure?  You will wake up, often slowly, after the procedure is complete, usually in a recovery area.  Your blood pressure, heart rate, breathing rate, and blood oxygen level will be monitored until the medicines you were given have worn off.  You may be given medicine to help you calm down if you feel anxious or agitated.  If you will be going home  the same day, your health care provider may check to make sure you can stand, drink, and urinate.  Your health care providers will treat your pain and side effects before you go home.  Do not drive for 24 hours if you received a sedative.  You may: ? Feel nauseous and vomit. ? Have a sore throat. ? Have mental slowness. ? Feel cold or shivery. ? Feel sleepy. ? Feel tired. ? Feel sore or achy, even in parts of your body where you did not have surgery. This information is not intended to replace advice given to you by your health care provider. Make sure you discuss any questions you have with your health care provider. Document Released: 07/03/2007 Document Revised: 09/06/2015 Document Reviewed: 03/10/2015 Elsevier Interactive Patient Education  2018 [employer] Anesthesia, Adult, Care After These instructions provide you with information about caring for yourself after your procedure. Your health care provider may also give you more specific instructions. Your treatment has been planned according to current medical practices, but problems sometimes occur. Call your health care provider if you have any problems or questions after your procedure. What can I expect after the procedure? After the  procedure, it is common to have:  Vomiting.  A sore throat.  Mental slowness.  It is common to feel:  Nauseous.  Cold or shivery.  Sleepy.  Tired.  Sore or achy, even in parts of your body where you did not have surgery.  Follow these instructions at home: For at least 24 hours after the procedure:  Do not: ? Participate in activities where you could fall or become injured. ? Drive. ? Use heavy machinery. ? Drink alcohol. ? Take sleeping pills or medicines that cause drowsiness. ? Make important decisions or sign legal documents. ? Take care of children on your own.  Rest. Eating and drinking  If you vomit, drink water, juice, or soup when you can drink without vomiting.  Drink enough fluid to keep your urine clear or pale yellow.  Make sure you have little or no nausea before eating solid foods.  Follow the diet recommended by your health care provider. General instructions  Have a responsible adult stay with you until you are awake and alert.  Return to your normal activities as told by your health care provider. Ask your health care provider what activities are safe for you.  Take over-the-counter and prescription medicines only as told by your health care provider.  If you smoke, do not smoke without supervision.  Keep all follow-up visits as told by your health care provider. This is important. Contact a health care provider if:  You continue to have nausea or vomiting at home, and medicines are not helpful.  You cannot drink fluids or start eating again.  You cannot urinate after 8-12 hours.  You develop a skin rash.  You have fever.  You have increasing redness at the site of your procedure. Get help right away if:  You have difficulty breathing.  You have chest pain.  You have unexpected bleeding.  You feel that you are having a life-threatening or urgent problem. This information is not intended to replace advice given to you by your  health care provider. Make sure you discuss any questions you have with your health care provider. Document Released: 07/02/2000 Document Revised: 08/29/2015 Document Reviewed: 03/10/2015 Elsevier Interactive Patient Education  [employer].

## 2017-02-07 ENCOUNTER — Encounter (HOSPITAL_COMMUNITY): Payer: Self-pay

## 2017-02-07 ENCOUNTER — Encounter (HOSPITAL_COMMUNITY)
Admission: RE | Admit: 2017-02-07 | Discharge: 2017-02-07 | Disposition: A | Discharge status: Home / Self Care | Payer: Medicaid Other | Source: Ambulatory Visit | Attending: Obstetrics and Gynecology | Admitting: Obstetrics and Gynecology

## 2017-02-11 ENCOUNTER — Encounter (HOSPITAL_COMMUNITY): Payer: Self-pay

## 2017-02-11 ENCOUNTER — Encounter (HOSPITAL_COMMUNITY)
Admission: RE | Admit: 2017-02-11 | Discharge: 2017-02-11 | Disposition: A | Discharge status: Home / Self Care | Payer: Medicaid Other | Source: Ambulatory Visit | Attending: Obstetrics and Gynecology | Admitting: Obstetrics and Gynecology

## 2017-02-11 ENCOUNTER — Other Ambulatory Visit: Payer: Self-pay

## 2017-02-11 DIAGNOSIS — Z9889 Other specified postprocedural states: Secondary | ICD-10-CM | POA: Diagnosis not present

## 2017-02-11 DIAGNOSIS — Z9049 Acquired absence of other specified parts of digestive tract: Secondary | ICD-10-CM | POA: Diagnosis not present

## 2017-02-11 DIAGNOSIS — R109 Unspecified abdominal pain: Secondary | ICD-10-CM | POA: Diagnosis not present

## 2017-02-11 DIAGNOSIS — N939 Abnormal uterine and vaginal bleeding, unspecified: Secondary | ICD-10-CM | POA: Insufficient documentation

## 2017-02-11 DIAGNOSIS — Z01812 Encounter for preprocedural laboratory examination: Secondary | ICD-10-CM | POA: Diagnosis present

## 2017-02-11 DIAGNOSIS — Z79899 Other long term (current) drug therapy: Secondary | ICD-10-CM | POA: Diagnosis not present

## 2017-02-11 DIAGNOSIS — N946 Dysmenorrhea, unspecified: Secondary | ICD-10-CM | POA: Insufficient documentation

## 2017-02-11 DIAGNOSIS — Z791 Long term (current) use of non-steroidal anti-inflammatories (NSAID): Secondary | ICD-10-CM | POA: Insufficient documentation

## 2017-02-11 DIAGNOSIS — F988 Other specified behavioral and emotional disorders with onset usually occurring in childhood and adolescence: Secondary | ICD-10-CM | POA: Diagnosis not present

## 2017-02-11 DIAGNOSIS — Z79891 Long term (current) use of opiate analgesic: Secondary | ICD-10-CM | POA: Insufficient documentation

## 2017-02-11 DIAGNOSIS — Z86711 Personal history of pulmonary embolism: Secondary | ICD-10-CM | POA: Insufficient documentation

## 2017-02-11 LAB — APTT: APTT: 30 s (ref 24–36)

## 2017-02-11 LAB — CBC
HEMATOCRIT: 41.7 % (ref 36.0–46.0)
HEMOGLOBIN: 13.6 g/dL (ref 12.0–15.0)
MCH: 28.9 pg (ref 26.0–34.0)
MCHC: 32.6 g/dL (ref 30.0–36.0)
MCV: 88.5 fL (ref 78.0–100.0)
Platelets: 284 10*3/uL (ref 150–400)
RBC: 4.71 MIL/uL (ref 3.87–5.11)
RDW: 14.8 % (ref 11.5–15.5)
WBC: 7.8 10*3/uL (ref 4.0–10.5)

## 2017-02-11 LAB — HCG, SERUM, QUALITATIVE: Preg, Serum: POSITIVE — AB

## 2017-02-11 NOTE — H&P (Signed)
[facility] Visit  @DATE @            Patient name: Peggy [name]      MRN [id number]  Date of birth: 08-Jan-1986  CC & HPI:  Peggy [name] is a 31 y.o. female presenting today for abdominal pain that started about 12 days ago. She initially went to her PCP but did not agree with what they said. On 01/02/17 her pain became severe and she went to [facility] ED. She was given pain medication with moderate relief. Pt reports that she still has pain, but it is not as severe. Associated symptoms include heavy vaginal bleeding since 01/03/17. Her menstrual cycles have never been regular and she notes that they are always painful with heavy bleeding and large blood clots. Menarche: 31 years old.  She is currently not on any form of birth control and notes that her husband was fixed. Pt reports having only tried Nuva ring when she was around 18 and the implant after that. Hx of PE at 31 years old and at 32 years old. Her second PE occurred while on the birth control implant. Pt is on permanent anticoagulation therapy. Hx of cyst. Pt notes her grandmother died of cervical cancer. Pt asked about an ablation. Otherwise, pt denies any other associated symptoms at this time.  ROS:  ROS (+) abdominal pain All systems are negative except as noted in the HPI and PMH.   Pertinent History Reviewed:   Reviewed: Significant for ovarian cyst Medical             Past Medical History:  Diagnosis Date  . ADD (attention deficit disorder)   . Complete miscarriage 09-12-06  . Factor V deficiency (HCC)   . LGSIL (low grade squamous intraepithelial dysplasia) 12-07 &3-08   C&B 3-08/CONDYLOMA TREATED 06-19-06  . Ovarian cyst   . Pulmonary embolus [facility])    age 89                              Surgical Hx:         Past Surgical History:  Procedure Laterality Date  . AUGMENTATION MAMMAPLASTY  08/2009  . CHOLECYSTECTOMY N/A 12/27/2014   Procedure: LAPAROSCOPIC CHOLECYSTECTOMY ;  Surgeon: Gaynelle Adu, MD;   Location: [facility] OR;  Service: General;  Laterality: N/A;  . ERCP N/A 12/26/2014   Procedure: ENDOSCOPIC RETROGRADE CHOLANGIOPANCREATOGRAPHY (ERCP);  Surgeon: Dorena Cookey, MD;  Location: [facility] OR;  Service: Gastroenterology;  Laterality: N/A;  . INCISE AND DRAIN ABCESS  11-16-09   LEFT LABIA  . TONSILLECTOMY    . WISDOM TOOTH EXTRACTION     Medications: Reviewed & Updated - see associated section                       Current Outpatient Prescriptions:  .  amphetamine-dextroamphetamine (ADDERALL) 10 MG tablet, Take 10 mg by mouth 2 (two) times daily with a meal., Disp: , Rfl:  .  dicyclomine (BENTYL) 20 MG tablet, Take 1 tablet (20 mg total) by mouth 2 (two) times daily., Disp: 20 tablet, Rfl: 0 .  diphenoxylate-atropine (LOMOTIL) 2.5-0.025 MG tablet, Take 1 tablet by mouth every 6 (six) hours., Disp: , Rfl:  .  docusate sodium (COLACE) 100 MG capsule, Take 1 capsule (100 mg total) by mouth 2 (two) times daily. (Patient not taking: Reported on 01/02/2017), Disp: 60 capsule, Rfl: 0 .  enoxaparin (LOVENOX) 40  MG/0.4ML injection, Inject 0.4 mLs (40 mg total) into the skin every 12 (twelve) hours. (Patient not taking: Reported on 01/02/2017), Disp: 60 Syringe, Rfl: 3 .  enoxaparin (LOVENOX) 60 MG/0.6ML injection, Inject 60 mg into the skin daily., Disp: , Rfl:  .  HYDROcodone-acetaminophen (NORCO/VICODIN) 5-325 MG tablet, Take 1 tablet by mouth every 6 (six) hours as needed for moderate pain. (Patient not taking: Reported on 01/02/2017), Disp: 5 tablet, Rfl: 0 .  ibuprofen (ADVIL,MOTRIN) 600 MG tablet, Take 1 tablet (600 mg total) by mouth every 6 (six) hours as needed. (Patient not taking: Reported on 01/02/2017), Disp: 90 tablet, Rfl: 0 .  ondansetron (ZOFRAN) 4 MG tablet, Take 1 tablet (4 mg total) by mouth every 6 (six) hours. (Patient not taking: Reported on 01/02/2017), Disp: 12 tablet, Rfl: 0 .  ondansetron (ZOFRAN) 8 MG tablet, Take 8 mg by mouth every 8 (eight) hours as needed for nausea or  vomiting., Disp: , Rfl:  .  promethazine (PHENERGAN) 25 MG tablet, Take 1 tablet (25 mg total) by mouth every 6 (six) hours as needed for nausea or vomiting., Disp: 12 tablet, Rfl: 0 .  traMADol (ULTRAM) 50 MG tablet, Take 1 tablet (50 mg total) by mouth every 6 (six) hours as needed., Disp: 15 tablet, Rfl: 0   Social History: Reviewed -  reports that she has been smoking.  She has been smoking about 0.25 packs per day. She has never used smokeless tobacco.  Objective Findings:  Vitals: Blood pressure 100/68, pulse (!) 108, height 5\' 10"  (1.778 m), weight 149 lb (67.6 kg), last menstrual period 01/07/2017, unknown if currently breastfeeding.  Physical Examination: General appearance - alert, well appearing, and in no distress and oriented to person, place, and time Mental status - alert, oriented to person, place, and time, normal mood, behavior, speech, dress, motor activity, and thought processes, affect appropriate to mood   CBC    Component Value Date/Time   WBC 7.8 02/11/2017 1319   RBC 4.71 02/11/2017 1319   HGB 13.6 02/11/2017 1319   HCT 41.7 02/11/2017 1319   PLT 284 02/11/2017 1319   MCV 88.5 02/11/2017 1319   MCH 28.9 02/11/2017 1319   MCHC 32.6 02/11/2017 1319   RDW 14.8 02/11/2017 1319   CMP Latest Ref Rng & Units 01/02/2017 12/29/2014 12/28/2014  Glucose 65 - 99 mg/dL 93 - 161(W158(H)  BUN 6 - 20 mg/dL <9(U<5(L) - <0(A<5(L)  Creatinine 0.44 - 1.00 mg/dL 5.400.80 - 9.810.70  Sodium 191135 - 145 mmol/L 135 - 137  Potassium 3.5 - 5.1 mmol/L 2.8(L) - 4.1  Chloride 101 - 111 mmol/L 101 - 104  CO2 22 - 32 mmol/L 25 - 26  Calcium 8.9 - 10.3 mg/dL 4.7(W8.5(L) - 8.8(L)  Total Protein 6.5 - 8.1 g/dL 6.1(L) 5.0(L) 5.3(L)  Total Bilirubin 0.3 - 1.2 mg/dL 0.4 2.9(H) 3.4(H)  Alkaline Phos 38 - 126 U/L 83 641(H) 845(H)  AST 15 - 41 U/L 54(H) 54(H) 97(H)  ALT 14 - 54 U/L 110(H) 181(H) 278(H)     Assessment & Plan:   A:  1. AUB 2. 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  Dysmenorrhea  P:  1. pt to proeceed with hysteroscopy, dilation and curtettage, with endometrial ablation 2. To d/c lovenox 24 hours before procedure. Risks , potential benefits , potential complications all reviewed with the patient.  By signing my name below, I, Diona BrownerJennifer Gorman, attest that this documentation has been prepared under the direction and in the presence  of Tilda Burrow, MD. Electronically Signed: Diona Browner, Medical Scribe. 01/09/17. 1:20 PM.  I personally performed the services described in this documentation, which was SCRIBED in my presence. The recorded information has been reviewed and considered accurate. It has been edited as necessary during review. Tilda Burrow, MD

## 2017-02-12 ENCOUNTER — Encounter (HOSPITAL_COMMUNITY)
Admission: RE | Disposition: A | Discharge status: Home / Self Care | Payer: Self-pay | Source: Ambulatory Visit | Attending: Obstetrics and Gynecology

## 2017-02-12 ENCOUNTER — Ambulatory Visit (HOSPITAL_COMMUNITY)
Admission: RE | Admit: 2017-02-12 | Discharge: 2017-02-12 | Disposition: A | Discharge status: Home / Self Care | Payer: Medicaid Other | Source: Ambulatory Visit | Attending: Obstetrics and Gynecology | Admitting: Obstetrics and Gynecology

## 2017-02-12 ENCOUNTER — Ambulatory Visit (HOSPITAL_COMMUNITY): Payer: Medicaid Other | Admitting: Anesthesiology

## 2017-02-12 ENCOUNTER — Encounter (HOSPITAL_COMMUNITY): Payer: Self-pay | Admitting: *Deleted

## 2017-02-12 DIAGNOSIS — N920 Excessive and frequent menstruation with regular cycle: Secondary | ICD-10-CM | POA: Diagnosis present

## 2017-02-12 DIAGNOSIS — Z79899 Other long term (current) drug therapy: Secondary | ICD-10-CM | POA: Insufficient documentation

## 2017-02-12 DIAGNOSIS — F329 Major depressive disorder, single episode, unspecified: Secondary | ICD-10-CM | POA: Insufficient documentation

## 2017-02-12 DIAGNOSIS — Z7901 Long term (current) use of anticoagulants: Secondary | ICD-10-CM | POA: Diagnosis not present

## 2017-02-12 DIAGNOSIS — N939 Abnormal uterine and vaginal bleeding, unspecified: Secondary | ICD-10-CM | POA: Insufficient documentation

## 2017-02-12 DIAGNOSIS — N946 Dysmenorrhea, unspecified: Secondary | ICD-10-CM | POA: Diagnosis not present

## 2017-02-12 DIAGNOSIS — R945 Abnormal results of liver function studies: Secondary | ICD-10-CM | POA: Insufficient documentation

## 2017-02-12 DIAGNOSIS — Z8049 Family history of malignant neoplasm of other genital organs: Secondary | ICD-10-CM | POA: Insufficient documentation

## 2017-02-12 DIAGNOSIS — Z86711 Personal history of pulmonary embolism: Secondary | ICD-10-CM | POA: Insufficient documentation

## 2017-02-12 DIAGNOSIS — Z87891 Personal history of nicotine dependence: Secondary | ICD-10-CM | POA: Insufficient documentation

## 2017-02-12 HISTORY — PX: DILITATION & CURRETTAGE/HYSTROSCOPY WITH NOVASURE ABLATION: SHX5568

## 2017-02-12 LAB — POCT I-STAT 4, (NA,K, GLUC, HGB,HCT)
Glucose, Bld: 80 mg/dL (ref 65–99)
HEMATOCRIT: 39 % (ref 36.0–46.0)
HEMOGLOBIN: 13.3 g/dL (ref 12.0–15.0)
POTASSIUM: 3.6 mmol/L (ref 3.5–5.1)
Sodium: 140 mmol/L (ref 135–145)

## 2017-02-12 LAB — HCG, QUANTITATIVE, PREGNANCY: hCG, Beta Chain, Quant, S: <1 m[IU]/mL (ref <5)

## 2017-02-12 SURGERY — DILATATION & CURETTAGE/HYSTEROSCOPY WITH NOVASURE ABLATION
Anesthesia: General

## 2017-02-12 MED ORDER — CEFAZOLIN SODIUM-DEXTROSE 2-4 GM/100ML-% IV SOLN
2.0000 g | INTRAVENOUS | Status: AC
  Administered 2017-02-12: 2 g via INTRAVENOUS
  Filled 2017-02-12: qty 100

## 2017-02-12 MED ORDER — FENTANYL CITRATE (PF) 100 MCG/2ML IJ SOLN
INTRAMUSCULAR | Status: DC | PRN
Start: 2017-02-12 — End: 2017-02-12
  Administered 2017-02-12: 100 ug via INTRAVENOUS
  Administered 2017-02-12: 50 ug via INTRAVENOUS

## 2017-02-12 MED ORDER — SODIUM CHLORIDE 0.9% FLUSH
INTRAVENOUS | Status: AC
  Filled 2017-02-12: qty 10

## 2017-02-12 MED ORDER — ONDANSETRON 4 MG PO TBDP
ORAL_TABLET | ORAL | Status: AC
  Filled 2017-02-12: qty 1

## 2017-02-12 MED ORDER — MIDAZOLAM HCL 2 MG/2ML IJ SOLN
1.0000 mg | Freq: Once | INTRAMUSCULAR | Status: AC | PRN
  Administered 2017-02-12: 2 mg via INTRAVENOUS

## 2017-02-12 MED ORDER — PROPOFOL 10 MG/ML IV BOLUS
INTRAVENOUS | Status: DC | PRN
  Administered 2017-02-12: 20 mg via INTRAVENOUS
  Administered 2017-02-12: 180 mg via INTRAVENOUS

## 2017-02-12 MED ORDER — BUPIVACAINE-EPINEPHRINE (PF) 0.5% -1:200000 IJ SOLN
INTRAMUSCULAR | Status: AC
  Filled 2017-02-12: qty 30

## 2017-02-12 MED ORDER — MIDAZOLAM HCL 2 MG/2ML IJ SOLN
INTRAMUSCULAR | Status: AC
  Filled 2017-02-12: qty 2

## 2017-02-12 MED ORDER — KETOROLAC TROMETHAMINE 10 MG PO TABS: 10.0000 mg | ORAL_TABLET | Freq: Four times a day (QID) | ORAL | 0 refills | Status: DC | PRN

## 2017-02-12 MED ORDER — ONDANSETRON 4 MG PO TBDP
4.0000 mg | ORAL_TABLET | Freq: Once | ORAL | Status: AC
  Administered 2017-02-12: 4 mg via ORAL

## 2017-02-12 MED ORDER — LIDOCAINE HCL (PF) 1 % IJ SOLN
INTRAMUSCULAR | Status: AC
  Filled 2017-02-12: qty 5

## 2017-02-12 MED ORDER — HYDROCODONE-ACETAMINOPHEN 5-325 MG PO TABS: 1.0000 | ORAL_TABLET | Freq: Four times a day (QID) | ORAL | 0 refills | Status: DC | PRN

## 2017-02-12 MED ORDER — FENTANYL CITRATE (PF) 250 MCG/5ML IJ SOLN
INTRAMUSCULAR | Status: AC
  Filled 2017-02-12: qty 5

## 2017-02-12 MED ORDER — LACTATED RINGERS IV SOLN
INTRAVENOUS | Status: DC
  Administered 2017-02-12: 08:00:00 via INTRAVENOUS

## 2017-02-12 MED ORDER — LIDOCAINE HCL (CARDIAC) 10 MG/ML IV SOLN
INTRAVENOUS | Status: DC | PRN
  Administered 2017-02-12: 50 mg via INTRAVENOUS

## 2017-02-12 MED ORDER — 0.9 % SODIUM CHLORIDE (POUR BTL) OPTIME
TOPICAL | Status: DC | PRN
  Administered 2017-02-12: 1000 mL

## 2017-02-12 MED ORDER — BUPIVACAINE-EPINEPHRINE (PF) 0.5% -1:200000 IJ SOLN
INTRAMUSCULAR | Status: DC | PRN
  Administered 2017-02-12: 30 mL

## 2017-02-12 MED ORDER — ONDANSETRON HCL 4 MG/2ML IJ SOLN
INTRAMUSCULAR | Status: AC
  Filled 2017-02-12: qty 2

## 2017-02-12 MED ORDER — ROCURONIUM BROMIDE 50 MG/5ML IV SOLN
INTRAVENOUS | Status: AC
  Filled 2017-02-12: qty 1

## 2017-02-12 MED ORDER — SODIUM CHLORIDE 0.9 % IR SOLN
Status: DC | PRN
  Administered 2017-02-12: 3000 mL

## 2017-02-12 SURGICAL SUPPLY — 28 items
ABLATOR ENDOMETRIAL BIPOLAR (ABLATOR) ×3 IMPLANT
BAG HAMPER (MISCELLANEOUS) ×3 IMPLANT
CATH ROBINSON RED A/P 16FR (CATHETERS) ×3 IMPLANT
CLOTH BEACON ORANGE TIMEOUT ST (SAFETY) ×3 IMPLANT
COVER LIGHT HANDLE STERIS (MISCELLANEOUS) ×6 IMPLANT
DECANTER SPIKE VIAL GLASS SM (MISCELLANEOUS) ×3 IMPLANT
FORMALIN 10 PREFIL 120ML (MISCELLANEOUS) ×3 IMPLANT
GAUZE SPONGE 4X4 12PLY STRL (GAUZE/BANDAGES/DRESSINGS) ×3 IMPLANT
GLOVE BIOGEL PI IND STRL 7.0 (GLOVE) ×2 IMPLANT
GLOVE BIOGEL PI IND STRL 9 (GLOVE) ×1 IMPLANT
GLOVE BIOGEL PI INDICATOR 7.0 (GLOVE) ×4
GLOVE BIOGEL PI INDICATOR 9 (GLOVE) ×2
GLOVE ECLIPSE 6.5 STRL STRAW (GLOVE) ×3 IMPLANT
GLOVE ECLIPSE 9.0 STRL (GLOVE) ×3 IMPLANT
GOWN SPEC L3 XXLG W/TWL (GOWN DISPOSABLE) ×3 IMPLANT
GOWN STRL REUS W/TWL LRG LVL3 (GOWN DISPOSABLE) ×3 IMPLANT
INST SET HYSTEROSCOPY (KITS) ×3 IMPLANT
IV NS IRRIG 3000ML ARTHROMATIC (IV SOLUTION) ×3 IMPLANT
KIT ROOM TURNOVER AP CYSTO (KITS) ×3 IMPLANT
KIT ROOM TURNOVER APOR (KITS) ×3 IMPLANT
MANIFOLD NEPTUNE II (INSTRUMENTS) ×3 IMPLANT
NS IRRIG 1000ML POUR BTL (IV SOLUTION) ×3 IMPLANT
PACK PERI GYN (CUSTOM PROCEDURE TRAY) ×3 IMPLANT
PAD ARMBOARD 7.5X6 YLW CONV (MISCELLANEOUS) ×3 IMPLANT
PAD TELFA 3X4 1S STER (GAUZE/BANDAGES/DRESSINGS) ×3 IMPLANT
SET BASIN LINEN APH (SET/KITS/TRAYS/PACK) ×3 IMPLANT
SET IRRIG Y TYPE TUR BLADDER L (SET/KITS/TRAYS/PACK) ×3 IMPLANT
SYR CONTROL 10ML LL (SYRINGE) ×3 IMPLANT

## 2017-02-12 NOTE — Op Note (Signed)
Please see the brief operative note for surgical details 

## 2017-02-12 NOTE — Anesthesia Preprocedure Evaluation (Signed)
Anesthesia Evaluation  Patient identified by MRN, date of birth, ID band Patient awake    Airway Mallampati: I  TM Distance: >3 FB Neck ROM: Full    Dental  (+) Teeth Intact   Pulmonary former smoker,  Hx of PE, on lovenox   Pulmonary exam normal        Cardiovascular Normal cardiovascular exam Rhythm:Regular Rate:Normal     Neuro/Psych PSYCHIATRIC DISORDERS Depression    GI/Hepatic negative GI ROS, Results for Peggy [name], Peggy [name] (MRN [id number]) as of 02/12/2017 06:38  Slightly elevated LFT's  01/02/2017 00:43 Alkaline Phosphatase: 83 Albumin: 3.6 Lipase: 53 (H) AST: 54 (H) ALT: 110 (H)    Endo/Other  negative endocrine ROS  Renal/GU negative Renal ROS     Musculoskeletal   Abdominal Normal abdominal exam  (+)  Abdomen: soft.    Peds  Hematology negative hematology ROS (+) Results for Peggy [name], Peggy [name] (MRN [id number]) as of 02/12/2017 06:38  02/11/2017 13:19 Hemoglobin: 13.6 HCT: 41.7 MCV: 88.5 MCH: 28.9 MCHC: 32.6 RDW: 14.8 Platelets: 284    Anesthesia Other Findings Results for Peggy [name], Peggy [name] (MRN [id number]) as of 02/12/2017 06:38  02/11/2017 13:19 Preg, Serum: WEAK POSITIVE (A)  Will do quantitative HCG   Reproductive/Obstetrics                            Anesthesia Physical Anesthesia Plan  ASA: III  Anesthesia Plan: General   Post-op Pain Management:    Induction: Intravenous  PONV Risk Score and Plan: 3 and Ondansetron and Midazolam  Airway Management Planned: LMA  Additional Equipment:   Intra-op Plan:   Post-operative Plan: Extubation in OR  Informed Consent: I have reviewed the patients History and Physical, chart, labs and discussed the procedure including the risks, benefits and alternatives for the proposed anesthesia with the patient or authorized representative who has indicated his/her understanding and acceptance.   Dental advisory given  Plan  Discussed with: CRNA and Surgeon  Anesthesia Plan Comments:         Anesthesia Quick Evaluation

## 2017-02-12 NOTE — Anesthesia Postprocedure Evaluation (Signed)
Anesthesia Post Note  Patient: Peggy [name]  Procedure(s) Performed: DILATATION & CURETTAGE/HYSTEROSCOPY WITH NOVASURE ENDOMETRIAL ABLATION (N/A )  Patient location during evaluation: PACU Anesthesia Type: General Level of consciousness: awake and alert Pain management: satisfactory to patient Vital Signs Assessment: post-procedure vital signs reviewed and stable Respiratory status: spontaneous breathing Cardiovascular status: stable Postop Assessment: adequate PO intake and no apparent nausea or vomiting Anesthetic complications: no     Last Vitals:  Vitals:   02/12/17 0921 02/12/17 0923  BP:  111/79  Pulse: 67   Resp: 16   Temp: (!) 36.3 C   SpO2: 100%     Last Pain:  Vitals:   02/12/17 0921  TempSrc: Oral                 Tieler Cournoyer

## 2017-02-12 NOTE — Discharge Instructions (Signed)
Endometrial Ablation Endometrial ablation is a procedure that destroys the thin inner layer of the lining of the uterus (endometrium). This procedure may be done:  To stop heavy periods.  To stop bleeding that is causing anemia.  To control irregular bleeding.  To treat bleeding caused by small tumors (fibroids) in the endometrium.  This procedure is often an alternative to major surgery, such as removal of the uterus and cervix (hysterectomy). As a result of this procedure:  You may not be able to have children. However, if you are premenopausal (you have not gone through menopause): ? You may still have a small chance of getting pregnant. ? You will need to use a reliable method of birth control after the procedure to prevent pregnancy.  You may stop having a menstrual period, or you may have only a small amount of bleeding during your period. Menstruation may return several years after the procedure.  Tell a health care provider about:  Any allergies you have.  All medicines you are taking, including vitamins, herbs, eye drops, creams, and over-the-counter medicines.  Any problems you or family members have had with the use of anesthetic medicines.  Any blood disorders you have.  Any surgeries you have had.  Any medical conditions you have. What are the risks? Generally, this is a safe procedure. However, problems may occur, including:  A hole (perforation) in the uterus or bowel.  Infection of the uterus, bladder, or vagina.  Bleeding.  Damage to other structures or organs.  An air bubble in the lung (air embolus).  Problems with pregnancy after the procedure.  Failure of the procedure.  Decreased ability to diagnose cancer in the endometrium.  What happens before the procedure?  You will have tests of your endometrium to make sure there are no pre-cancerous cells or cancer cells present.  You may have an ultrasound of the uterus.  You may be given  medicines to thin the endometrium.  Ask your health care provider about: ? Changing or stopping your regular medicines. This is especially important if you take diabetes medicines or blood thinners. ? Taking medicines such as aspirin and ibuprofen. These medicines can thin your blood. Do not take these medicines before your procedure if your doctor tells you not to.  Plan to have someone take you home from the hospital or clinic. What happens during the procedure?  You will lie on an exam table with your feet and legs supported as in a pelvic exam.  To lower your risk of infection: ? Your health care team will wash or sanitize their hands and put on germ-free (sterile) gloves. ? Your genital area will be washed with soap.  An IV tube will be inserted into one of your veins.  You will be given a medicine to help you relax (sedative).  A surgical instrument with a light and camera (resectoscope) will be inserted into your vagina and moved into your uterus. This allows your surgeon to see inside your uterus.  Endometrial tissue will be removed using one of the following methods: ? Radiofrequency. This method uses a radiofrequency-alternating electric current to remove the endometrium. ? Cryotherapy. This method uses extreme cold to freeze the endometrium. ? Heated-free liquid. This method uses a heated saltwater (saline) solution to remove the endometrium. ? Microwave. This method uses high-energy microwaves to heat up the endometrium and remove it. ? Thermal balloon. This method involves inserting a catheter with a balloon tip into the uterus. The balloon tip is   filled with heated fluid to remove the endometrium. The procedure may vary among health care providers and hospitals. What happens after the procedure?  Your blood pressure, heart rate, breathing rate, and blood oxygen level will be monitored until the medicines you were given have worn off.  As tissue healing occurs, you may  notice vaginal bleeding for 4-6 weeks after the procedure. You may also experience: ? Cramps. ? Thin, watery vaginal discharge that is light pink or brown in color. ? A need to urinate more frequently than usual. ? Nausea.  Do not drive for 24 hours if you were given a sedative.  Do not have sex or insert anything into your vagina until your health care provider approves. Summary  Endometrial ablation is done to treat the many causes of heavy menstrual bleeding.  The procedure may be done only after medications have been tried to control the bleeding.  Plan to have someone take you home from the hospital or clinic. This information is not intended to replace advice given to you by your health care provider. Make sure you discuss any questions you have with your health care provider. Document Released: 02/03/2004 Document Revised: 04/12/2016 Document Reviewed: 04/12/2016 Elsevier Interactive Patient Education  2017 [employer]  

## 2017-02-12 NOTE — Anesthesia Procedure Notes (Signed)
Procedure Name: LMA Insertion Date/Time: 02/12/2017 7:58 AM Performed by: Franco NonesYates, [place] Stell S, CRNA Pre-anesthesia Checklist: Patient identified, Patient being monitored, Emergency Drugs available, Timeout performed and Suction available Patient Re-evaluated:Patient Re-evaluated prior to induction Oxygen Delivery Method: Circle System Utilized Preoxygenation: Pre-oxygenation with 100% oxygen Induction Type: IV induction Ventilation: Mask ventilation without difficulty LMA: LMA inserted LMA Size: 4.0 Number of attempts: 1 Placement Confirmation: positive ETCO2 and breath sounds checked- equal and bilateral Tube secured with: Tape Dental Injury: Teeth and Oropharynx as per pre-operative assessment

## 2017-02-12 NOTE — Transfer of Care (Signed)
Immediate Anesthesia Transfer of Care Note  Patient: Peggy [name]  Procedure(s) Performed: DILATATION & CURETTAGE/HYSTEROSCOPY WITH NOVASURE ENDOMETRIAL ABLATION (N/A )  Patient Location: PACU  Anesthesia Type:General  Level of Consciousness: awake and alert   Airway & Oxygen Therapy: Patient Spontanous Breathing and non-rebreather face mask  Post-op Assessment: Report given to RN and Post -op Vital signs reviewed and stable  Post vital signs: Reviewed and stable  Last Vitals:  Vitals:   02/12/17 0708  BP: 110/77  Pulse: 78  Resp: (!) 25  Temp: 37 C  SpO2: 98%    Last Pain:  Vitals:   02/12/17 0708  TempSrc: Oral         Complications: No apparent anesthesia complications

## 2017-02-12 NOTE — Interval H&P Note (Signed)
History and Physical Interval Note:  02/12/2017 7:11 AM  Peggy LocksKrista D Echeverri  has presented today for surgery, with the diagnosis of dysmenorrhea, abnormal uterine and vaginal bleeding  The various methods of treatment have been discussed with the patient and family. After consideration of risks, benefits and other options for treatment, the patient has consented to  Procedure(s): DILATATION & CURETTAGE/HYSTEROSCOPY WITH NOVASURE ENDOMETRIAL ABLATION (N/A) as a surgical intervention .  The patient's history has been reviewed, patient examined, no change in status, stable for surgery.  I have reviewed the patient's chart and labs.  Questions were answered to the patient's satisfaction.   History update: the patient has a weakly positive HcG from yesterday.  Her LMP was on time 02/06/17, ending yesterday. Her husband had a vasectomy after their last child, and was tested after the vasectomy to confirm sterility. No other partners. We will check the quantitative HCG, and if it is low positive, IE 10 or less, will attribute it to Hypothalamic production of HCG and proceed with surgery.  If higher than that range, will repeat hcg in 48 hours and reschedule procedure for later once second results obtained.   Tilda BurrowFERGUSON,Nashley Cordoba V

## 2017-02-12 NOTE — Brief Op Note (Signed)
02/12/2017  9:04 AM  PATIENT:  Peggy [name]  31 y.o. female  PRE-OPERATIVE DIAGNOSIS:  dysmenorrhea, abnormal uterine and vaginal bleeding, on Lovenox lifetime anticoagulation  POST-OPERATIVE DIAGNOSIS:  dysmenorrhea, abnormal uterine and vaginal bleeding, on Lovenox lifetime anticoagulation  PROCEDURE:  Procedure(s): DILATATION & CURETTAGE/HYSTEROSCOPY WITH NOVASURE ENDOMETRIAL ABLATION (N/A)  SURGEON:  Surgeon(s) and Role:    Tilda Burrow* Kerry Chisolm V, MD - Primary  PHYSICIAN ASSISTANT:   ASSISTANTS: none   ANESTHESIA:   general and paracervical block  EBL:  0 mL   BLOOD ADMINISTERED:none  DRAINS: none   LOCAL MEDICATIONS USED:  MARCAINE    and Amount: 30 ml  SPECIMEN:  No Specimen  DISPOSITION OF SPECIMEN:  N/A  COUNTS:  YES  TOURNIQUET:  * No tourniquets in log *  DICTATION: .Dragon Dictation  PLAN OF CARE: Discharge to home after PACU  PATIENT DISPOSITION:  PACU - hemodynamically stable.   Delay start of Pharmacological VTE agent (>24hrs) due to surgical blood loss or risk of bleeding: yes, restart tomorrow  Details of procedure: Patient was taken to the operating room prepped and draped for vaginal procedure, with timeout conducted and procedure confirmed by operative team. Ancef was administered 2 g. Speculum was inserted cervix grasped sounded to 8 cm dilated to 25 JamaicaFrench allowing introduction of the rigid 30 operative hysteroscope which confirmed a thin endometrial cavity without significant endometrial tissue. Both tubal ostia could be visualized. The uterus was somewhat retroverted. The cavity was sounded to 5 cm using the NovaSure sounding device, and then the ablation device was placed in position, with a width of 2.8 cm, activated and a 47 second ablation sequence completed with good thermal change. Repeat hysteroscopy confirmed satisfactory coagulation changes and photo documentation was performed and will become a part of the chart. Paracervical block using  30 cc of Marcaine was injected at 2:00 4:00 to 8:00 and 10:00 around the cervix and the patient allowed to go to recovery room in stable condition she'll be discharged home on Toradol and Vicodin for follow-up in 2 weeks for office. Sponge and needle counts correct

## 2017-02-13 ENCOUNTER — Encounter (HOSPITAL_COMMUNITY): Payer: Self-pay | Admitting: Obstetrics and Gynecology

## 2017-02-17 ENCOUNTER — Encounter: Payer: Self-pay | Admitting: Obstetrics and Gynecology

## 2017-02-17 DIAGNOSIS — N83202 Unspecified ovarian cyst, left side: Secondary | ICD-10-CM | POA: Insufficient documentation

## 2017-02-20 ENCOUNTER — Encounter: Payer: Self-pay | Admitting: Obstetrics and Gynecology

## 2017-02-20 ENCOUNTER — Ambulatory Visit: Payer: Medicaid Other | Admitting: Obstetrics and Gynecology

## 2017-02-20 VITALS — BP 102/70 | HR 93 | Wt 147.2 lb

## 2017-02-20 DIAGNOSIS — Z09 Encounter for follow-up examination after completed treatment for conditions other than malignant neoplasm: Secondary | ICD-10-CM

## 2017-02-20 DIAGNOSIS — Z9889 Other specified postprocedural states: Secondary | ICD-10-CM | POA: Diagnosis not present

## 2017-02-20 NOTE — Progress Notes (Signed)
Patient ID: Peggy LocksKrista D Martindelcampo, female   DOB: 12/12/1985, 31 y.o.   MRN: [id number]    Subjective:  Peggy [name] is a 31 y.o. female now 1 weeks status post Dilatation & Curettage/ Hysteroscopy with Novasure Endometrial Ablation.  Pt reports that she is doing well. She denies any bleeding or vaginal discharge.  Review of Systems Negative    Diet:   Normal   Bowel movements : normal.  The patient is not having any pain.  Objective:  BP 102/70 (BP Location: Right Arm, Patient Position: Sitting, Cuff Size: Small)   Pulse 93   Wt 147 lb 3.2 oz (66.8 kg)   LMP 01/23/2017 (Exact Date)   BMI 20.53 kg/m  General:Well developed, well nourished.  No acute distress. Abdomen: Bowel sounds normal, soft, non-tender. Pelvic Exam: Deferred by patient  Incision(s):   Healing well, no drainage, no erythema, no hernia, no swelling, no dehiscence,     Assessment:  Post-Op 1 weeks s/p Dilatation & Curettage/ Hysteroscopy with Novasure Endometrial Ablation    Healing well postoperatively. Discussion only, patient reports doing well   Plan:  1.Wound care discussed   2. . current medications. 3. Activity restrictions: none 4. return to work: not applicable. 5. Follow up in PRN.   By signing my name below, I, Diona BrownerJennifer Gorman, attest that this documentation has been prepared under the direction and in the presence of Tilda BurrowFerguson, Sereniti Wan V, MD. Electronically Signed: Diona BrownerJennifer Gorman, Medical Scribe. 02/20/17. 9:30 AM. I personally performed the services described in this documentation, which was SCRIBED in my presence. The recorded information has been reviewed and considered accurate. It has been edited as necessary during review. Tilda BurrowFERGUSON,Alyjah Lovingood V, MD

## 2018-01-30 ENCOUNTER — Encounter: Payer: Self-pay | Admitting: Obstetrics and Gynecology

## 2018-01-30 ENCOUNTER — Ambulatory Visit (INDEPENDENT_AMBULATORY_CARE_PROVIDER_SITE_OTHER): Payer: Self-pay | Admitting: Obstetrics and Gynecology

## 2018-01-30 ENCOUNTER — Other Ambulatory Visit (HOSPITAL_COMMUNITY)
Admission: RE | Admit: 2018-01-30 | Discharge: 2018-01-30 | Disposition: A | Discharge status: Home / Self Care | Payer: Medicaid Other | Source: Ambulatory Visit | Attending: Obstetrics and Gynecology | Admitting: Obstetrics and Gynecology

## 2018-01-30 VITALS — BP 114/86 | HR 91 | Ht 71.0 in | Wt 148.2 lb

## 2018-01-30 DIAGNOSIS — Z01419 Encounter for gynecological examination (general) (routine) without abnormal findings: Secondary | ICD-10-CM

## 2018-01-30 DIAGNOSIS — Z3009 Encounter for other general counseling and advice on contraception: Secondary | ICD-10-CM

## 2018-01-30 NOTE — Progress Notes (Signed)
Patient ID: Peggy [name], female   DOB: [birth year], 32 y.o.   MRN: [id number]   Assessment:  1) Annual Gyn Exam 2) pap and RPR/HIV today 3) Try Aleve, Midol, and Ibuprofin for menstrual cramping  Plan:  1. pap smear done, next pap due 3 years 2. return annually or prn 3    Annual mammogram advised after age 38 Subjective:  Peggy [name] is a 32 y.o. female [phone] who presents for annual exam. Patient's last menstrual period was 01/26/2018. The patient has complaints today of a period that comes every two weeks. It only lasts 48 hours, but the pain is "horrible." The pain is the same as before her D&C on 02/12/2017 but her periods are much shorter. Her last pap on record was on 09/19/2011 and was negative. She had one in the beginning of 2018, at [facility] after she had her son, that was also normal. She performs regular breast exams and has no concerns.  The following portions of the patient's history were reviewed and updated as appropriate: allergies, current medications, past family history, past medical history, past social history, past surgical history and problem list. Past Medical History:  Diagnosis Date  . ADD (attention deficit disorder)   . Complete miscarriage 09-12-06  . Factor V deficiency (HCC)   . LGSIL (low grade squamous intraepithelial dysplasia) 12-07 &3-08   C&B 3-08/CONDYLOMA TREATED 06-19-06  . Ovarian cyst   . Pulmonary embolus [facility])    age 30    Past Surgical History:  Procedure Laterality Date  . AUGMENTATION MAMMAPLASTY  08/2009  . CHOLECYSTECTOMY N/A 12/27/2014   Procedure: LAPAROSCOPIC CHOLECYSTECTOMY ;  Surgeon: Gaynelle Adu, MD;  Location: [facility] OR;  Service: General;  Laterality: N/A;  . DILITATION & CURRETTAGE/HYSTROSCOPY WITH NOVASURE ABLATION N/A 02/12/2017   Procedure: DILATATION & CURETTAGE (no specimen),/HYSTEROSCOPY WITH NOVASURE ENDOMETRIAL ABLATION;  Surgeon: Tilda Burrow, MD;  Location: [facility]S;  Service: Gynecology;  Laterality: N/A;   . ERCP N/A 12/26/2014   Procedure: ENDOSCOPIC RETROGRADE CHOLANGIOPANCREATOGRAPHY (ERCP);  Surgeon: Dorena Cookey, MD;  Location: [facility] OR;  Service: Gastroenterology;  Laterality: N/A;  . INCISE AND DRAIN ABCESS  11-16-09   LEFT LABIA  . TONSILLECTOMY    . WISDOM TOOTH EXTRACTION      Current Outpatient Medications:  .  amphetamine-dextroamphetamine (ADDERALL) 10 MG tablet, Take 10 mg by mouth 2 (two) times daily with a meal., Disp: , Rfl:  .  enoxaparin (LOVENOX) 60 MG/0.6ML injection, Inject 60 mg into the skin daily. , Disp: , Rfl:   Review of Systems Constitutional: negative Gastrointestinal: negative Genitourinary: negative  Objective:  BP 114/86 (BP Location: Right Arm, Patient Position: Sitting, Cuff Size: Normal)   Pulse 91   Ht 5\' 11"  (1.803 m)   Wt 148 lb 3.2 oz (67.2 kg)   LMP 01/26/2018   BMI 20.67 kg/m    BMI: Body mass index is 20.67 kg/m.  General Appearance: Alert, appropriate appearance for age. No acute distress HEENT: Grossly normal Neck / Thyroid:  Cardiovascular: RRR; normal S1, S2, no murmur Lungs: CTA bilaterally Back: No CVAT Breast Exam: DEFERRED Gastrointestinal: Soft, non-tender, no masses or organomegaly Pelvic Exam: Cervix: Normal size shape and contour. Mid position. Small. Vagina: well supported Adnexa: negative Rectovaginal: not indicated Lymphatic Exam: Non-palpable nodes in neck, clavicular, axillary, or inguinal regions  Skin: no rash or abnormalities Neurologic: Normal gait and speech, no tremor  Psychiatric: Alert and oriented, appropriate affect.  Urinalysis:Not done  By signing my  name below, I, Pietro Cassis, attest that this documentation has been prepared under the direction and in the presence of Tilda Burrow, MD. Electronically Signed: Pietro Cassis, Medical Scribe. 01/30/18. 9:31 AM.  I personally performed the services described in this documentation, which was SCRIBED in my presence. The recorded information has been  reviewed and considered accurate. It has been edited as necessary during review. Tilda Burrow, MD

## 2018-01-31 LAB — HIV ANTIBODY (ROUTINE TESTING W REFLEX): HIV SCREEN 4TH GENERATION: NONREACTIVE

## 2018-01-31 LAB — RPR: RPR: NONREACTIVE

## 2018-02-03 LAB — CYTOLOGY - PAP
CHLAMYDIA, DNA PROBE: NEGATIVE
Diagnosis: NEGATIVE
HPV: NOT DETECTED
NEISSERIA GONORRHEA: NEGATIVE

## 2018-02-07 ENCOUNTER — Telehealth: Payer: Self-pay | Admitting: *Deleted

## 2018-02-07 NOTE — Telephone Encounter (Signed)
Calling restrictions. Unable to leave message.  

## 2018-04-13 ENCOUNTER — Emergency Department (HOSPITAL_COMMUNITY)
Admission: EM | Admit: 2018-04-13 | Discharge: 2018-04-13 | Disposition: A | Discharge status: Home / Self Care | Payer: Self-pay | Attending: Emergency Medicine | Admitting: Emergency Medicine

## 2018-04-13 ENCOUNTER — Encounter (HOSPITAL_COMMUNITY): Payer: Self-pay | Admitting: Emergency Medicine

## 2018-04-13 ENCOUNTER — Other Ambulatory Visit: Payer: Self-pay

## 2018-04-13 ENCOUNTER — Ambulatory Visit (HOSPITAL_COMMUNITY)
Admission: EM | Admit: 2018-04-13 | Discharge: 2018-04-13 | Disposition: A | Discharge status: Home / Self Care | Payer: Medicaid Other | Source: Ambulatory Visit

## 2018-04-13 ENCOUNTER — Emergency Department (HOSPITAL_COMMUNITY): Payer: Self-pay

## 2018-04-13 DIAGNOSIS — F17200 Nicotine dependence, unspecified, uncomplicated: Secondary | ICD-10-CM | POA: Insufficient documentation

## 2018-04-13 DIAGNOSIS — R0789 Other chest pain: Secondary | ICD-10-CM | POA: Insufficient documentation

## 2018-04-13 DIAGNOSIS — Z79899 Other long term (current) drug therapy: Secondary | ICD-10-CM | POA: Insufficient documentation

## 2018-04-13 DIAGNOSIS — Z86711 Personal history of pulmonary embolism: Secondary | ICD-10-CM | POA: Insufficient documentation

## 2018-04-13 LAB — BASIC METABOLIC PANEL
Anion gap: 8 (ref 5–15)
BUN: 13 mg/dL (ref 6–20)
CHLORIDE: 106 mmol/L (ref 98–111)
CO2: 25 mmol/L (ref 22–32)
Calcium: 9.5 mg/dL (ref 8.9–10.3)
Creatinine, Ser: 0.96 mg/dL (ref 0.44–1.00)
GFR calc non Af Amer: >60 mL/min (ref >60)
Glucose, Bld: 92 mg/dL (ref 70–99)
Potassium: 4.2 mmol/L (ref 3.5–5.1)
Sodium: 139 mmol/L (ref 135–145)

## 2018-04-13 LAB — I-STAT BETA HCG BLOOD, ED (MC, WL, AP ONLY): I-stat hCG, quantitative: <5 m[IU]/mL (ref <5)

## 2018-04-13 LAB — CBC
HCT: 47.2 % — ABNORMAL HIGH (ref 36.0–46.0)
Hemoglobin: 14.9 g/dL (ref 12.0–15.0)
MCH: 29.6 pg (ref 26.0–34.0)
MCHC: 31.6 g/dL (ref 30.0–36.0)
MCV: 93.7 fL (ref 80.0–100.0)
PLATELETS: 249 10*3/uL (ref 150–400)
RBC: 5.04 MIL/uL (ref 3.87–5.11)
RDW: 12.9 % (ref 11.5–15.5)
WBC: 8.5 10*3/uL (ref 4.0–10.5)
nRBC: 0 % (ref 0.0–0.2)

## 2018-04-13 LAB — I-STAT TROPONIN, ED: TROPONIN I, POC: 0 ng/mL (ref 0.00–0.08)

## 2018-04-13 MED ORDER — IOPAMIDOL (ISOVUE-370) INJECTION 76%
INTRAVENOUS | Status: AC
  Administered 2018-04-13: 57 mL
  Filled 2018-04-13: qty 100

## 2018-04-13 NOTE — ED Provider Notes (Signed)
[facility] Provider Note   CSN: [id number] Arrival date & time: 04/13/18  1037     History   Chief Complaint Chief Complaint  Patient presents with  . Shortness of Breath  . Chest Pain    HPI Peggy [name] is a 33 y.o. female.  HPI Patient was diagnosed with influenza approximately March 28, 2018.  She was diagnosed with "walking pneumonia" at an urgent care center last week prescribed an antibiotic and a steroid.  She does not recall which antibiotic.  She continues to have cough.  Yesterday developed substernal chest pain, pleuritic in quality which radiates under her left breast.  Associated with shortness of breath.  She thinks this may feel like pulmonary embolism she is had in the past.  She denies noncompliance with Lovenox.  Denies ever having had fever.  No other associated symptoms.  She continues to cough.  Chest pain worse with deep inspiration not improved with anything.  No other associated symptoms Past Medical History:  Diagnosis Date  . ADD (attention deficit disorder)   . Complete miscarriage 09-12-06  . Factor V deficiency (HCC)   . LGSIL (low grade squamous intraepithelial dysplasia) 12-07 &3-08   C&B 3-08/CONDYLOMA TREATED 06-19-06  . Ovarian cyst   . Pulmonary embolus [facility](HCC)    age 33    Patient Active Problem List   Diagnosis Date Noted  . Cyst of ovary, left 02/17/2017  . Menorrhagia with regular cycle 02/12/2017  . Fibromyalgia syndrome 01/09/2017  . Severe obesity (BMI 35.0-35.9 with comorbidity) (HCC) 01/09/2017  . Reactive depression (situational) 01/09/2017  . Elevated LFTs 12/27/2014  . Choledocholithiasis 12/25/2014  . Pregnancy 10/04/2013  . Postpartum state 10/04/2013  . ADD (attention deficit disorder)     Past Surgical History:  Procedure Laterality Date  . AUGMENTATION MAMMAPLASTY  08/2009  . CHOLECYSTECTOMY N/A 12/27/2014   Procedure: LAPAROSCOPIC CHOLECYSTECTOMY ;  Surgeon: Gaynelle AduEric Wilson, MD;   [facility] OR;  Service: General;  Laterality: N/A;  . DILITATION & CURRETTAGE/HYSTROSCOPY WITH NOVASURE ABLATION N/A 02/12/2017   Procedure: DILATATION & CURETTAGE (no specimen),/HYSTEROSCOPY WITH NOVASURE ENDOMETRIAL ABLATION;  Surgeon: Tilda BurrowFerguson, John V, MD;  Location: [facility] ORS;  Service: Gynecology;  Laterality: N/A;  . ERCP N/A 12/26/2014   Procedure: ENDOSCOPIC RETROGRADE CHOLANGIOPANCREATOGRAPHY (ERCP);  Surgeon: Dorena CookeyJohn Hayes, MD;  [facility];  Service: Gastroenterology;  Laterality: N/A;  . INCISE AND DRAIN ABCESS  11-16-09   LEFT LABIA  . TONSILLECTOMY    . WISDOM TOOTH EXTRACTION       OB History    Gravida  4   Para  3   Term  3   Preterm      AB  1   Living  3     SAB  1   TAB      Ectopic      Multiple  0   Live Births  3            Home Medications    Prior to Admission medications   Medication Sig Start Date End Date Taking? Authorizing Provider  amphetamine-dextroamphetamine (ADDERALL) 10 MG tablet Take 10 mg by mouth 2 (two) times daily with a meal.    [provider]  enoxaparin (LOVENOX) 60 MG/0.6ML injection Inject 60 mg into the skin daily.     [provider]    Family History Family History  Problem Relation Age of Onset  . Diabetes Maternal Grandmother   . Hypertension Maternal Grandfather   .  Cancer Maternal Grandfather        bladder  . Breast cancer Paternal Grandmother   . Cancer Paternal Grandmother        ovarian  . Cancer Paternal Grandfather        ? colon  . Asthma Father   . Thyroid disease Mother   . Thyroid disease Brother     Social History Social History   Tobacco Use  . Smoking status: Current Every Day Smoker    Packs/day: 0.25    Years: 5.00    Pack years: 1.25    Last attempt to quit: 11/18/2016    Years since quitting: 1.4  . Smokeless tobacco: Never Used  Substance Use Topics  . Alcohol use: Yes    Comment: once a week  . Drug use: No     Allergies   Caffeine and Peanut  butter flavor   Review of Systems Review of Systems  Constitutional: Negative.   HENT: Negative.   Respiratory: Positive for cough and shortness of breath.   Cardiovascular: Positive for chest pain.  Gastrointestinal: Negative.   Musculoskeletal: Negative.   Skin: Negative.   Neurological: Negative.   Psychiatric/Behavioral: Negative.   All other systems reviewed and are negative.    Physical Exam Updated Vital Signs BP 104/77 (BP Location: Left Arm)   Pulse 99   Temp 98.7 F (37.1 C) (Oral)   Resp 16   Ht 5\' 10"  (1.778 m)   Wt 68.9 kg   LMP 03/13/2018 (Exact Date)   SpO2 97%   BMI 21.81 kg/m   Physical Exam Vitals signs and nursing note reviewed.  Constitutional:      Appearance: She is well-developed.  HENT:     Head: Normocephalic and atraumatic.  Eyes:     Conjunctiva/sclera: Conjunctivae normal.     Pupils: Pupils are equal, round, and reactive to light.  Neck:     Musculoskeletal: Neck supple.     Thyroid: No thyromegaly.     Trachea: No tracheal deviation.  Cardiovascular:     Rate and Rhythm: Normal rate and regular rhythm.     Heart sounds: Normal heart sounds. No murmur.  Pulmonary:     Effort: Pulmonary effort is normal.     Breath sounds: Normal breath sounds.  Abdominal:     General: Bowel sounds are normal. There is no distension.     Palpations: Abdomen is soft.     Tenderness: There is no abdominal tenderness.  Musculoskeletal: Normal range of motion.        General: No tenderness.  Skin:    General: Skin is warm and dry.     Findings: No rash.  Neurological:     Mental Status: She is alert.     Coordination: Coordination normal.      ED Treatments / Results  Labs (all labs ordered are listed, but only abnormal results are displayed) Labs Reviewed  BASIC METABOLIC PANEL  CBC  I-STAT TROPONIN, ED  I-STAT BETA HCG BLOOD, ED ([facility], [facility], [facility] ONLY)    EKG EKG Interpretation  Date/Time:  Sunday April 13 2018 10:44:01  EST Ventricular Rate:  105 PR Interval:  132 QRS Duration: 82 QT Interval:  326 QTC Calculation: 430 R Axis:   76 Text Interpretation:  Sinus tachycardia Biatrial enlargement Cannot rule out Anterior infarct , age undetermined Abnormal ECG SINCE LAST TRACING HEART RATE HAS INCREASED Confirmed by Doug SouJacubowitz, Laparis Durrett [phone]) on 04/13/2018 11:20:59 AM  Results for orders placed or performed during  the hospital encounter of 04/13/18  Basic metabolic panel  Result Value Ref Range   Sodium 139 135 - 145 mmol/L   Potassium 4.2 3.5 - 5.1 mmol/L   Chloride 106 98 - 111 mmol/L   CO2 25 22 - 32 mmol/L   Glucose, Bld 92 70 - 99 mg/dL   BUN 13 6 - 20 mg/dL   Creatinine, Ser 2.95 0.44 - 1.00 mg/dL   Calcium 9.5 8.9 - 28.4 mg/dL   GFR calc non Af Amer >60 >60 mL/min   GFR calc Af Amer >60 >60 mL/min   Anion gap 8 5 - 15  CBC  Result Value Ref Range   WBC 8.5 4.0 - 10.5 K/uL   RBC 5.04 3.87 - 5.11 MIL/uL   Hemoglobin 14.9 12.0 - 15.0 g/dL   HCT 13.2 (H) 44.0 - 10.2 %   MCV 93.7 80.0 - 100.0 fL   MCH 29.6 26.0 - 34.0 pg   MCHC 31.6 30.0 - 36.0 g/dL   RDW 72.5 36.6 - 44.0 %   Platelets 249 150 - 400 K/uL   nRBC 0.0 0.0 - 0.2 %  I-stat troponin, ED  Result Value Ref Range   Troponin i, poc 0.00 0.00 - 0.08 ng/mL   Comment 3          I-Stat beta hCG blood, ED  Result Value Ref Range   I-stat hCG, quantitative <5.0 <5 mIU/mL   Comment 3           Ct Angio Chest Pe W And/or Wo Contrast  Result Date: 04/13/2018 CLINICAL DATA:  Chest pain and shortness of breath. Recent diagnosis of pneumonia. History of pulmonary embolus. EXAM: CT ANGIOGRAPHY CHEST WITH CONTRAST TECHNIQUE: Multidetector CT imaging of the chest was performed using the standard protocol during bolus administration of intravenous contrast. Multiplanar CT image reconstructions and MIPs were obtained to evaluate the vascular anatomy. CONTRAST:  3mL ISOVUE-370 IOPAMIDOL (ISOVUE-370) INJECTION 76% COMPARISON:  CT angiogram of the chest  dated 09/15/2014 FINDINGS: Cardiovascular: No filling defect is identified in the pulmonary arterial tree to suggest pulmonary embolus. There is some blurring of subsegmental pulmonary arterial vessels in the lung bases, especially the right middle lobe and right lower lobe, due to motion artifact from breathing during the scan, which leads to blurred hypodense vessels in some affected areas simulating subsegmental emboli. Sensitivity for such small emboli is reduced due to the patient's breathing motion during the exam. Mediastinum/Nodes: Unremarkable Lungs/Pleura: Paraseptal bullae noted at the lung apices. 4 mm sub solid density subpleural nodule in the left lower lobe along the major fissure on image 86/6, stable. Sub solid 0.7 by 0.5 cm nodule anteriorly in the left lower lobe on image 93/6, previously 0.2 by 0.5 cm on 08/13/2013 and previously 0.5 by 0.7 cm on 09/15/2014. Upper Abdomen: Cholecystectomy. Musculoskeletal: Bilateral breast implants. Review of the MIP images confirms the above findings. IMPRESSION: 1. No filling defect is identified in the pulmonary arterial tree to suggest pulmonary embolus. Sensitivity for such small emboli is reduced due to breathing motion artifact. 2. Mild sub solid nodularity in the left lower lobe along the major fissure is stable from 2016 and thought to be benign. 3. Paraseptal bullae at the lung apices. Electronically Signed   By: Gaylyn Rong M.D.   On: 04/13/2018 12:36   Radiology No results found.  Procedures Procedures (including critical care time)  Medications Ordered in ED Medications - No data to display   Initial Impression / Assessment and  Plan / ED Course  I have reviewed the triage vital signs and the nursing notes.  Pertinent labs & imaging results that were available during my care of the patient were reviewed by me and considered in my medical decision making (see chart for details).     1 PM patient is resting comfortably.   Appears in no distress.  Chest pain is felt to be nonspecific.  Likely secondary to recent respiratory illness.  Suggest Tylenol for pain.  Continue Lovenox as prescribed.  I counseled patient for 5 minutes on smoking cessation.  Follow-up with PMD if not better in 4 or 5 days.  Final Clinical Impressions(s) / ED Diagnoses  Diagnosis #1 atypical chest pain #2 tobacco abuse Final diagnoses:  None    ED Discharge Orders    None       Doug Sou, MD 04/13/18 1306

## 2018-04-13 NOTE — ED Notes (Signed)
D/C reviewed with patient.

## 2018-04-13 NOTE — ED Notes (Signed)
ED Provider at bedside. 

## 2018-04-13 NOTE — Discharge Instructions (Addendum)
Continue Lovenox as prescribed.  Make sure that you take tonight's dose.  Take Tylenol as directed for pain.  Call your primary care provider to be seen in the office if you continue to have significant pain in 4 or 5 days.  Ask your primary care provider to help you to stop smoking

## 2018-04-13 NOTE — ED Triage Notes (Signed)
Pt states, :"I was diagnosed with flu before Christmas and walking PNA last week.  I have Factor 5 and take lovanox injections twice a day." Pt reports sternal CP new last night with SOB, radiated to L chest.

## 2019-03-03 ENCOUNTER — Other Ambulatory Visit: Payer: Self-pay

## 2019-03-03 DIAGNOSIS — Z20822 Contact with and (suspected) exposure to covid-19: Secondary | ICD-10-CM

## 2019-03-04 LAB — NOVEL CORONAVIRUS, NAA: SARS-CoV-2, NAA: NOT DETECTED

## 2019-03-06 ENCOUNTER — Telehealth: Payer: Self-pay | Admitting: Family Medicine

## 2019-03-06 NOTE — Telephone Encounter (Signed)
Patient is calling to receive her negative COVID test results. Patient expressed understanding. 

## 2019-03-09 IMAGING — CT CT ABD-PELV W/O CM
2 of 4 series · 17 of 46 positions shown, 19 images · non-contrast
Comparison: 12/25/2014

CLINICAL DATA: Abdominal pain with suspected diverticulitis. Pain
is left lower quadrant and pelvic. Nausea and vomiting for 6 days.

EXAM:
CT ABDOMEN AND PELVIS WITHOUT CONTRAST
TECHNIQUE: Multidetector CT imaging of the abdomen and pelvis was performed
following the standard protocol without IV contrast.

[Series 3: abd/ pelvis 5.0 i30f 2 · axial · 0.62mm/px · z∈[+810,+1225]mm · 14 of 91 slices shown, 16 images]
[im 4/91  soft-tissue]
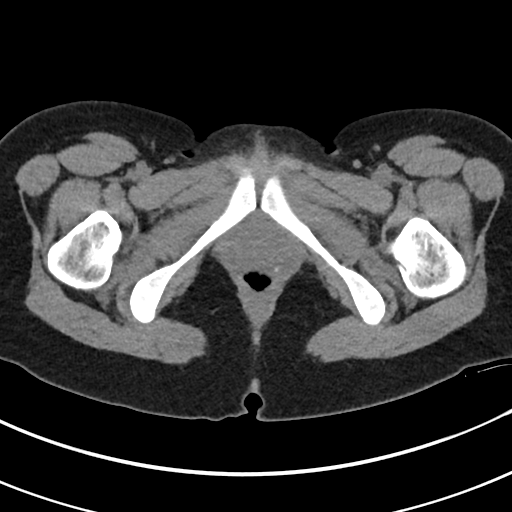
[im 4/91  bone]
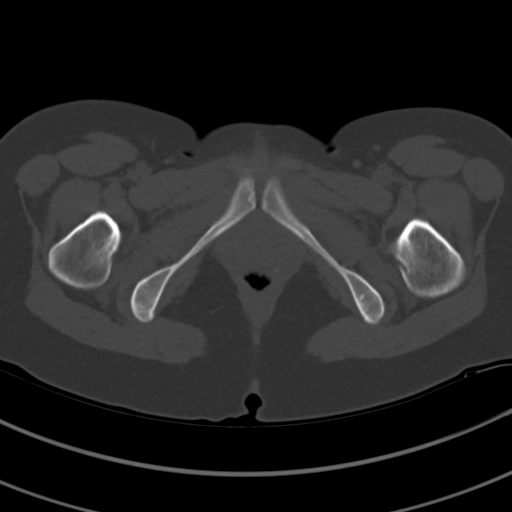
[im 11/91  soft-tissue]
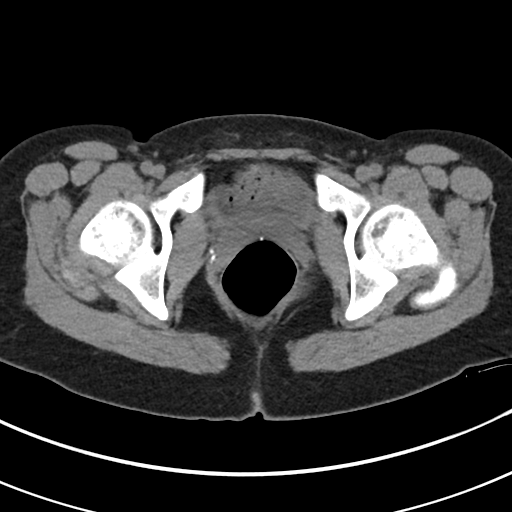
[im 18/91  soft-tissue]
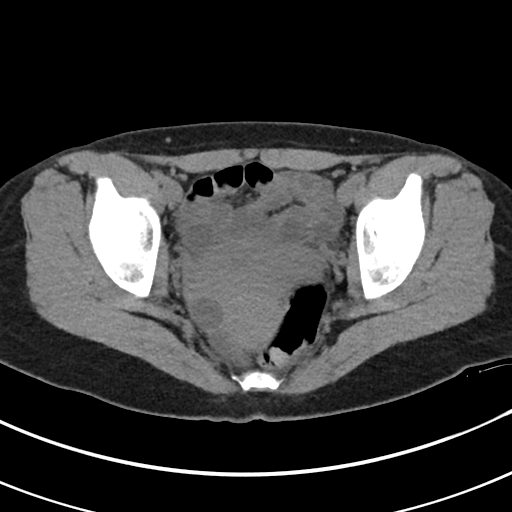
[im 25/91  soft-tissue]
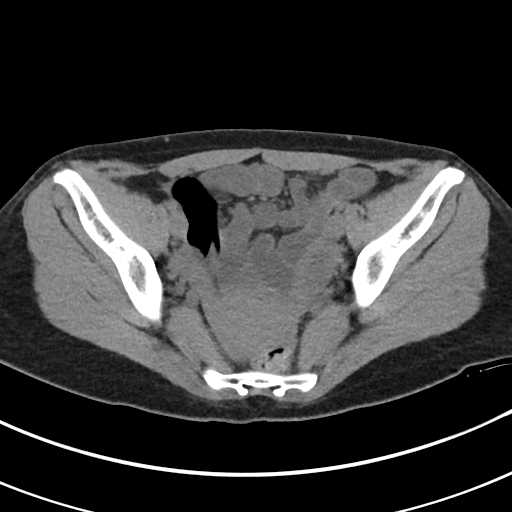
[im 32/91  soft-tissue]
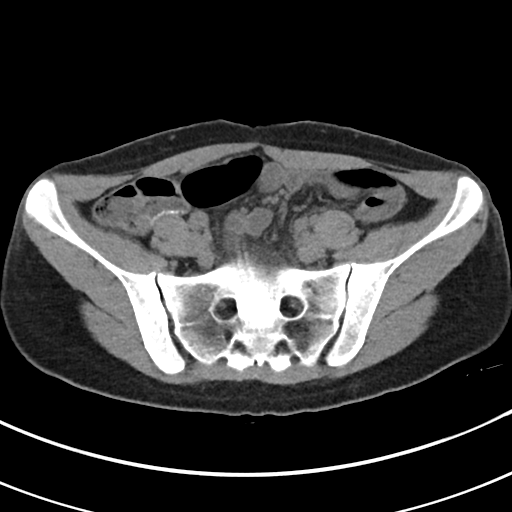
[im 35/91  soft-tissue]
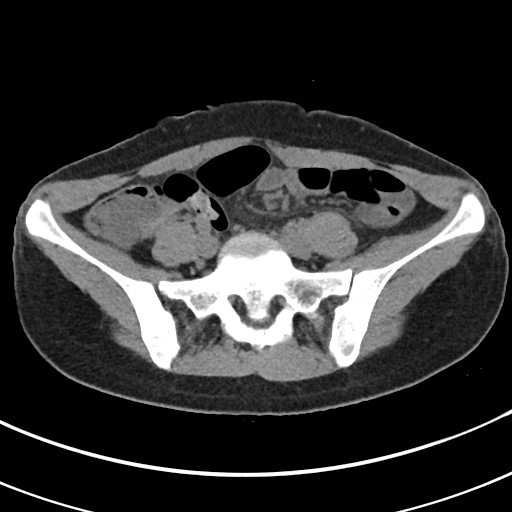
[im 42/91  soft-tissue]
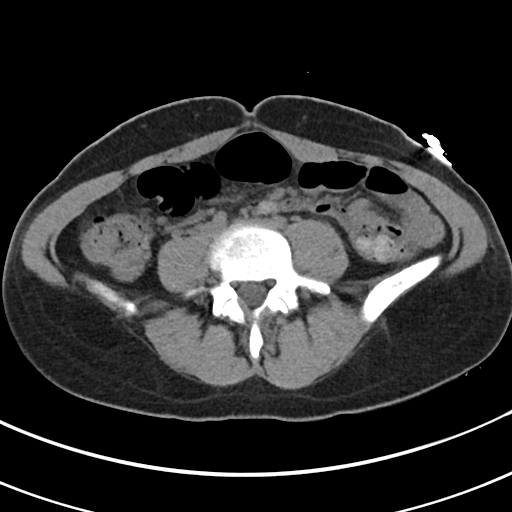
[im 49/91  soft-tissue]
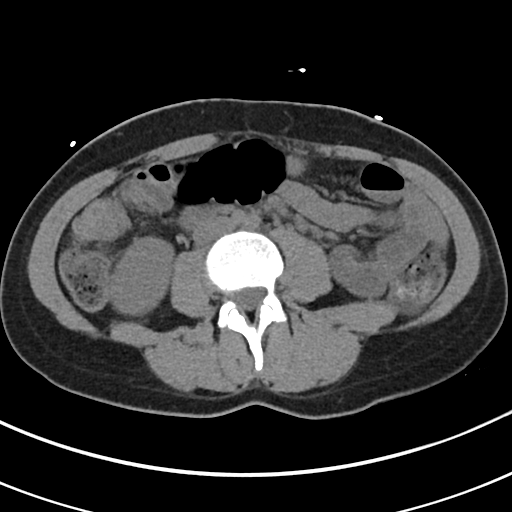
[im 56/91  soft-tissue]
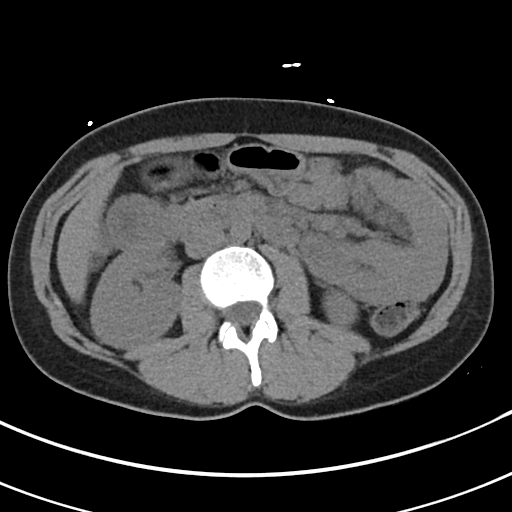
[im 56/91  bone]
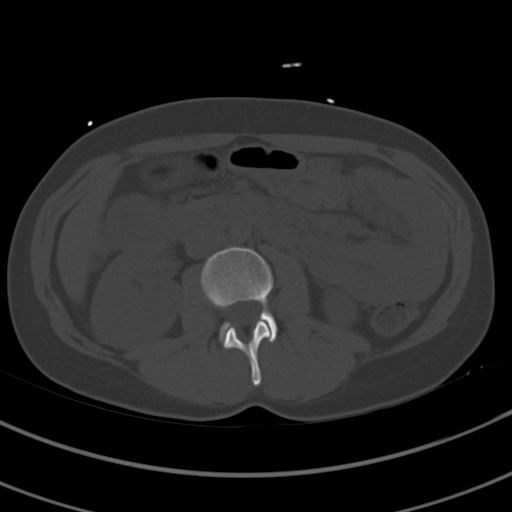
[im 59/91  soft-tissue]
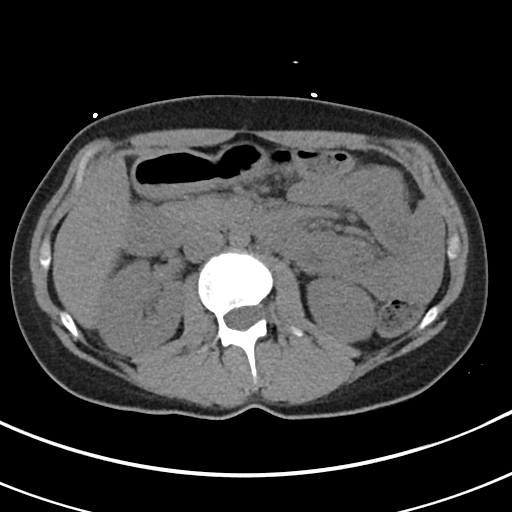
[im 66/91  soft-tissue]
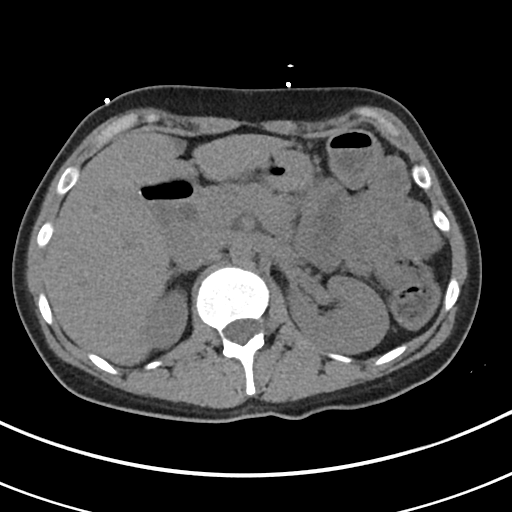
[im 73/91  soft-tissue]
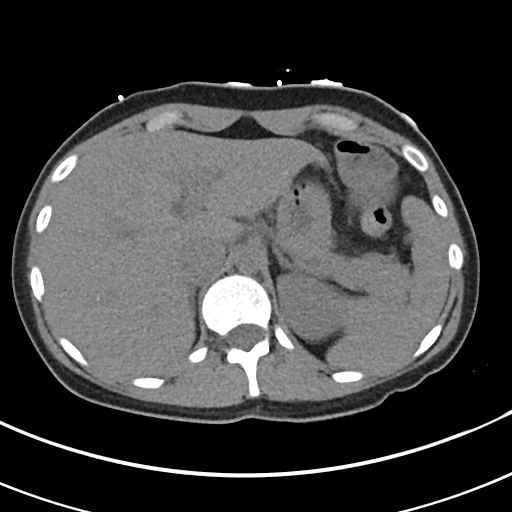
[im 80/91  soft-tissue]
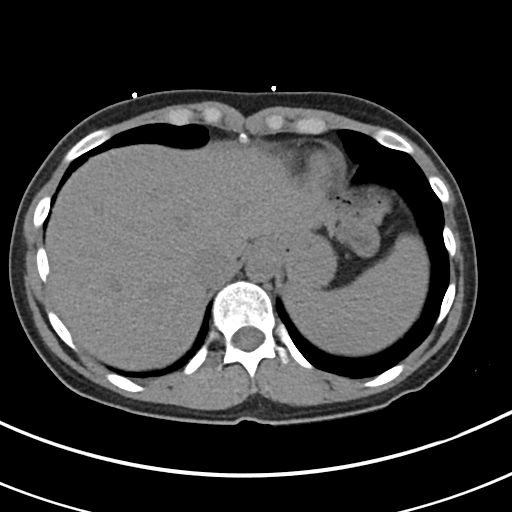
[im 87/91  soft-tissue]
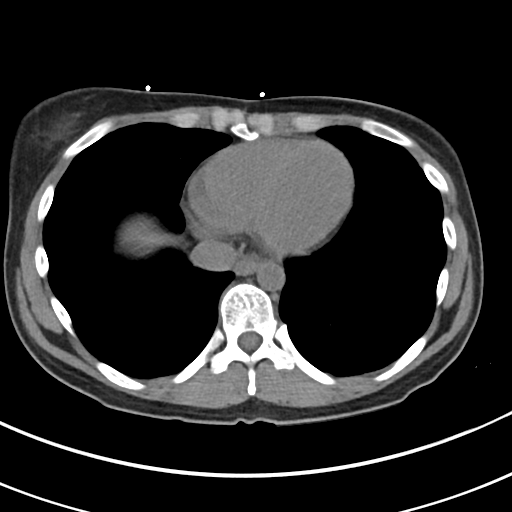

[Series 6: cor st · coronal · 0.87mm/px · 3 of 98 slices shown]
[im 33/98  soft-tissue]
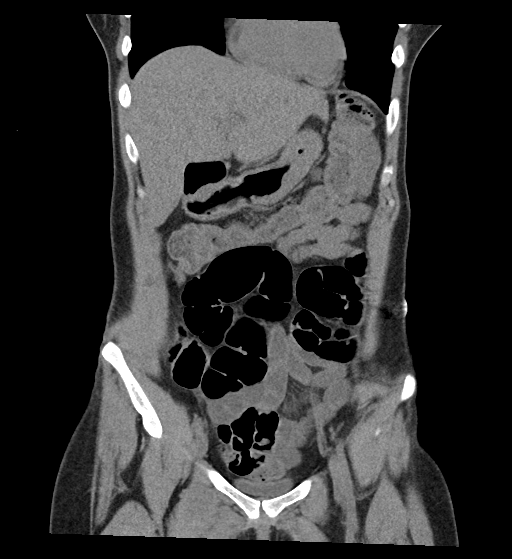
[im 44/98  soft-tissue]
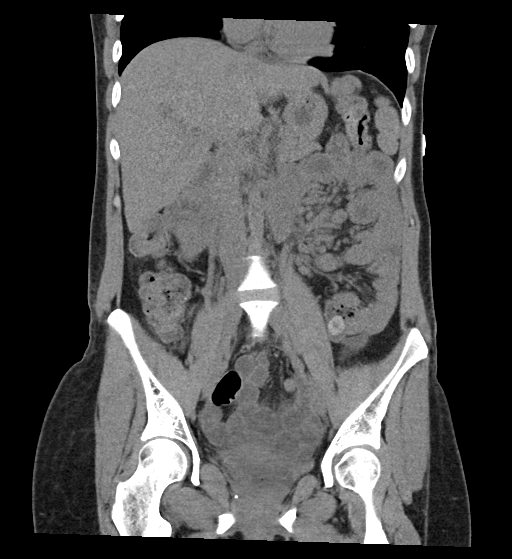
[im 54/98  soft-tissue]
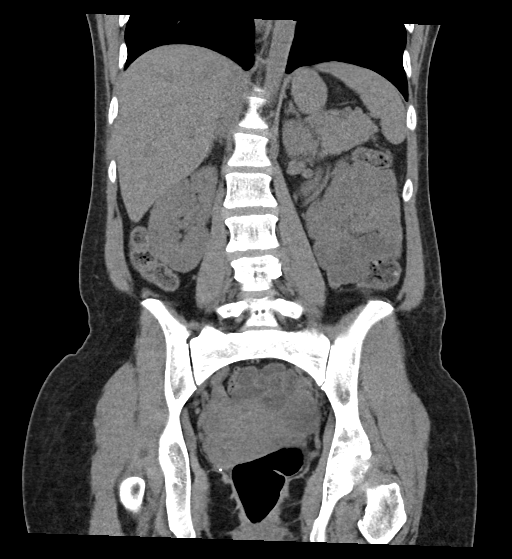

[17 of 46 positions shown; findings below may reference images not displayed]

FINDINGS: Lower chest: No acute finding. Partly seen bilateral breast
implants.

Hepatobiliary: No focal liver abnormality.Cholecystectomy which
accounts for prominence of the common bile duct. Normal bilirubin
today.

Pancreas: Unremarkable.

Spleen: Unremarkable.

Adrenals/Urinary Tract: Negative adrenals. Punctate stone in the
lower right kidney, nonobstructive. No hydronephrosis or suspected
ureteral stone (there are multiple pelvic phleboliths and the lower
ureters are difficult to discretely visualize, but no secondary
signs of ureteral calculi). Limited assessment of the bladder due to
collapsed state

Stomach/Bowel:  No obstruction or inflammatory changes.

Vascular/Lymphatic: No vascular FINDINGS.  No mass or adenopathy.

Reproductive:Bilateral adnexal cystic densities, measuring up to 3
cm on the left. Patient had pelvic ultrasound July 2016

Other: No ascites or pneumoperitoneum.

Musculoskeletal: Mild lumbar dextroscoliosis, also seen previously.
No acute or aggressive finding.
IMPRESSION: 1. No acute finding. No evidence of diverticulitis or
diverticulosis.
2. 3 cm left adnexal cyst with simple appearance.
3. Punctate right nephrolithiasis.

## 2019-10-23 ENCOUNTER — Ambulatory Visit (HOSPITAL_COMMUNITY)
Admission: RE | Admit: 2019-10-23 | Discharge: 2019-10-23 | Disposition: A | Discharge status: Home / Self Care | Payer: Medicaid Other | Source: Ambulatory Visit | Attending: Internal Medicine | Admitting: Internal Medicine

## 2019-10-23 ENCOUNTER — Other Ambulatory Visit (HOSPITAL_COMMUNITY): Payer: Self-pay | Admitting: Internal Medicine

## 2019-10-23 ENCOUNTER — Other Ambulatory Visit: Payer: Self-pay

## 2019-10-23 DIAGNOSIS — M542 Cervicalgia: Secondary | ICD-10-CM | POA: Diagnosis not present

## 2020-02-29 ENCOUNTER — Ambulatory Visit
Admission: RE | Admit: 2020-02-29 | Discharge: 2020-02-29 | Disposition: A | Discharge status: Home / Self Care | Payer: Medicaid Other | Source: Ambulatory Visit | Attending: Emergency Medicine | Admitting: Emergency Medicine

## 2020-02-29 ENCOUNTER — Other Ambulatory Visit: Payer: Self-pay

## 2020-02-29 VITALS — BP 108/76 | HR 88 | Temp 98.7°F | Resp 20

## 2020-02-29 DIAGNOSIS — J029 Acute pharyngitis, unspecified: Secondary | ICD-10-CM | POA: Diagnosis not present

## 2020-02-29 DIAGNOSIS — J040 Acute laryngitis: Secondary | ICD-10-CM | POA: Insufficient documentation

## 2020-02-29 LAB — POCT RAPID STREP A (OFFICE): Rapid Strep A Screen: NEGATIVE

## 2020-02-29 MED ORDER — FLUTICASONE PROPIONATE 50 MCG/ACT NA SUSP: 2.0000 | Freq: Every day | NASAL | 0 refills | Status: DC

## 2020-02-29 MED ORDER — CETIRIZINE HCL 10 MG PO TABS: 10.0000 mg | ORAL_TABLET | Freq: Every day | ORAL | 0 refills | Status: DC

## 2020-02-29 NOTE — Discharge Instructions (Addendum)
Strep negative.  Culture sent.   COVID testing ordered.  It will take between 5-7 days for test results.  Someone will contact you regarding abnormal results.    In the meantime: You should remain isolated in your home for 10 days from symptom onset AND greater than 72 hours after symptoms resolution (absence of fever without the use of fever-reducing medication and improvement in respiratory symptoms), whichever is longer Get plenty of rest and push fluids Use OTC zyrtec for nasal congestion, runny nose, and/or sore throat Use OTC flonase for nasal congestion and runny nose Use medications daily for symptom relief Use OTC medications like ibuprofen or tylenol as needed fever or pain Follow up with PCP Call or go to the ED if you have any new or worsening symptoms such as fever, cough, shortness of breath, chest tightness, chest pain, turning blue, changes in mental status, etc..Marland Kitchen

## 2020-02-29 NOTE — ED Provider Notes (Signed)
[facility]   [id number] 02/29/20 Arrival Time: 1548   CC: Sore throat fever  SUBJECTIVE: History from: patient.  Peggy [name] is a 34 y.o. female who presents with sore throat, hoarseness, and fever x 1 day.  Denies sick exposure to COVID, flu or strep.  Has tried NOT tried OTC medications.  Symptoms are made worse with talking.  Reports previous symptoms in the past with strep.  Denies previous COVID infection.   Denies chills, SOB, wheezing, chest pain, nausea, changes in bowel or bladder habits.    Patient currently pregnant  ROS: As per HPI.  All other pertinent ROS negative.     Past Medical History:  Diagnosis Date   ADD (attention deficit disorder)    Complete miscarriage 09-12-06   Factor V deficiency (HCC)    LGSIL (low grade squamous intraepithelial dysplasia) 12-07 &3-08   C&B 3-08/CONDYLOMA TREATED 06-19-06   Ovarian cyst    Pulmonary embolus [facility])    age 39   Past Surgical History:  Procedure Laterality Date   AUGMENTATION MAMMAPLASTY  08/2009   CHOLECYSTECTOMY N/A 12/27/2014   Procedure: LAPAROSCOPIC CHOLECYSTECTOMY ;  Surgeon: Gaynelle Adu, MD;  Location: [facility] OR;  Service: General;  Laterality: N/A;   DILITATION & CURRETTAGE/HYSTROSCOPY WITH NOVASURE ABLATION N/A 02/12/2017   Procedure: DILATATION & CURETTAGE (no specimen),/HYSTEROSCOPY WITH NOVASURE ENDOMETRIAL ABLATION;  Surgeon: Tilda Burrow, MD;  Location: [facility] ORS;  Service: Gynecology;  Laterality: N/A;   ERCP N/A 12/26/2014   Procedure: ENDOSCOPIC RETROGRADE CHOLANGIOPANCREATOGRAPHY (ERCP);  Surgeon: Dorena Cookey, MD;  Location: [facility] OR;  Service: Gastroenterology;  Laterality: N/A;   INCISE AND DRAIN ABCESS  11-16-09   LEFT LABIA   TONSILLECTOMY     WISDOM TOOTH EXTRACTION     Allergies  Allergen Reactions   Caffeine Anaphylaxis   Peanut Butter Flavor Anaphylaxis   No current facility-administered medications on file prior to encounter.   Current Outpatient Medications on  File Prior to Encounter  Medication Sig Dispense Refill   amphetamine-dextroamphetamine (ADDERALL) 10 MG tablet Take 10 mg by mouth 2 (two) times daily with a meal.     Social History   Socioeconomic History   Marital status: Legally Separated    Spouse name: Not on file   Number of children: Not on file   Years of education: Not on file   Highest education level: Not on file  Occupational History   Not on file  Tobacco Use   Smoking status: Current Every Day Smoker    Packs/day: 0.25    Years: 5.00    Pack years: 1.25    Last attempt to quit: 11/18/2016    Years since quitting: 3.2   Smokeless tobacco: Never Used  Vaping Use   Vaping Use: Never used  Substance and Sexual Activity   Alcohol use: Yes    Comment: once a week   Drug use: No   Sexual activity: Yes    Birth control/protection: Other-see comments    Comment: spouse had vasectomy  Other Topics Concern   Not on file  Social History Narrative   Not on file   Social Determinants of Health   Financial Resource Strain:    Difficulty of Paying Living Expenses: Not on file  Food Insecurity:    Worried About Programme researcher, broadcasting/film/video in the Last Year: Not on file   [employer] of Food in the Last Year: Not on file  Transportation Needs:    Lack of Transportation (Medical): Not on  file   Lack of Transportation (Non-Medical): Not on file  Physical Activity:    Days of Exercise per Week: Not on file   Minutes of Exercise per Session: Not on file  Stress:    Feeling of Stress : Not on file  Social Connections:    Frequency of Communication with Friends and Family: Not on file   Frequency of Social Gatherings with Friends and Family: Not on file   Attends Religious Services: Not on file   Active Member of Clubs or Organizations: Not on file   Attends Banker Meetings: Not on file   Marital Status: Not on file  Intimate Partner Violence:    Fear of Current or Ex-Partner: Not on  file   Emotionally Abused: Not on file   Physically Abused: Not on file   Sexually Abused: Not on file   Family History  Problem Relation Age of Onset   Diabetes Maternal Grandmother    Hypertension Maternal Grandfather    Cancer Maternal Grandfather        bladder   Breast cancer Paternal Grandmother    Cancer Paternal Grandmother        ovarian   Cancer Paternal Grandfather        ? colon   Asthma Father    Thyroid disease Mother    Thyroid disease Brother     OBJECTIVE:  Vitals:   02/29/20 1625  BP: 108/76  Pulse: 88  Resp: 20  Temp: 98.7 F (37.1 C)  SpO2: 96%     General appearance: alert; appears mildly fatigued, but nontoxic; speaking in full sentences and tolerating own secretions HEENT: NCAT; Ears: EACs clear, TMs pearly gray; Eyes: PERRL.  EOM grossly intact. Nose: nares patent without rhinorrhea, Throat: oropharynx clear, tonsils non erythematous or enlarged, uvula midline; voice hoarse Neck: supple without LAD Lungs: unlabored respirations, symmetrical air entry; cough: mild; no respiratory distress; CTAB Heart: regular rate and rhythm.   Skin: warm and dry Psychological: alert and cooperative; normal mood and affect  LABS:  Results for orders placed or performed during the hospital encounter of 02/29/20 (from the past 24 hour(s))  POCT rapid strep A     Status: None   Collection Time: 02/29/20  4:30 PM  Result Value Ref Range   Rapid Strep A Screen Negative Negative     ASSESSMENT & PLAN:  1. Sore throat   2. Laryngitis     Meds ordered this encounter  Medications   cetirizine (ZYRTEC) 10 MG tablet    Sig: Take 1 tablet (10 mg total) by mouth daily.    Dispense:  30 tablet    Refill:  0    Order Specific Question:   Supervising Provider    Answer:   Eustace Moore [[id number]]   fluticasone (FLONASE) 50 MCG/ACT nasal spray    Sig: Place 2 sprays into both nostrils daily.    Dispense:  16 g    Refill:  0    Order Specific  Question:   Supervising Provider    Answer:   Eustace Moore [[id number]]   Strep negative.  Culture sent.   COVID testing ordered.  It will take between 5-7 days for test results.  Someone will contact you regarding abnormal results.    In the meantime: You should remain isolated in your home for 10 days from symptom onset AND greater than 72 hours after symptoms resolution (absence of fever without the use of fever-reducing medication and improvement in  respiratory symptoms), whichever is longer Get plenty of rest and push fluids Use OTC zyrtec for nasal congestion, runny nose, and/or sore throat Use OTC flonase for nasal congestion and runny nose Use medications daily for symptom relief Use OTC medications like ibuprofen or tylenol as needed fever or pain Call or go to the ED if you have any new or worsening symptoms such as fever, cough, shortness of breath, chest tightness, chest pain, turning blue, changes in mental status, etc...   Reviewed expectations re: course of current medical issues. Questions answered. Outlined signs and symptoms indicating need for more acute intervention. Patient verbalized understanding. After Visit Summary given.         Rennis Harding, PA-C 02/29/20 1709

## 2020-02-29 NOTE — ED Triage Notes (Signed)
Pt presents with c/o sore throat and fever that began yesterday.  

## 2020-03-01 LAB — COVID-19, FLU A+B AND RSV
Influenza A, NAA: NOT DETECTED
Influenza B, NAA: NOT DETECTED
RSV, NAA: NOT DETECTED
SARS-CoV-2, NAA: NOT DETECTED

## 2020-03-02 ENCOUNTER — Encounter (HOSPITAL_COMMUNITY): Payer: Self-pay | Admitting: Chiropractic Medicine

## 2020-03-02 ENCOUNTER — Other Ambulatory Visit: Payer: Self-pay

## 2020-03-02 ENCOUNTER — Inpatient Hospital Stay (HOSPITAL_COMMUNITY)
Admission: AD | Admit: 2020-03-02 | Discharge: 2020-03-02 | Disposition: A | Discharge status: Home / Self Care | Payer: Medicaid Other | Attending: Chiropractic Medicine | Admitting: Chiropractic Medicine

## 2020-03-02 DIAGNOSIS — O99111 Other diseases of the blood and blood-forming organs and certain disorders involving the immune mechanism complicating pregnancy, first trimester: Secondary | ICD-10-CM | POA: Insufficient documentation

## 2020-03-02 DIAGNOSIS — O99331 Smoking (tobacco) complicating pregnancy, first trimester: Secondary | ICD-10-CM | POA: Diagnosis not present

## 2020-03-02 DIAGNOSIS — L0591 Pilonidal cyst without abscess: Secondary | ICD-10-CM | POA: Insufficient documentation

## 2020-03-02 DIAGNOSIS — F17210 Nicotine dependence, cigarettes, uncomplicated: Secondary | ICD-10-CM | POA: Diagnosis not present

## 2020-03-02 DIAGNOSIS — D682 Hereditary deficiency of other clotting factors: Secondary | ICD-10-CM | POA: Diagnosis not present

## 2020-03-02 DIAGNOSIS — O99711 Diseases of the skin and subcutaneous tissue complicating pregnancy, first trimester: Secondary | ICD-10-CM | POA: Insufficient documentation

## 2020-03-02 DIAGNOSIS — Z3A08 8 weeks gestation of pregnancy: Secondary | ICD-10-CM | POA: Insufficient documentation

## 2020-03-02 LAB — URINALYSIS, ROUTINE W REFLEX MICROSCOPIC
Bilirubin Urine: NEGATIVE
Glucose, UA: NEGATIVE mg/dL
Hgb urine dipstick: NEGATIVE
Ketones, ur: NEGATIVE mg/dL
Nitrite: NEGATIVE
Protein, ur: NEGATIVE mg/dL
Specific Gravity, Urine: 1.017 (ref 1.005–1.030)
pH: 5 (ref 5.0–8.0)

## 2020-03-02 MED ORDER — LIDOCAINE 5 % EX OINT
TOPICAL_OINTMENT | Freq: Once | CUTANEOUS | Status: AC
  Administered 2020-03-02: 1 via TOPICAL
  Filled 2020-03-02: qty 35.44

## 2020-03-02 MED ORDER — CLINDAMYCIN HCL 300 MG PO CAPS: 300.0000 mg | ORAL_CAPSULE | Freq: Three times a day (TID) | ORAL | 0 refills | Status: AC

## 2020-03-02 NOTE — MAU Provider Note (Signed)
History     CSN: [id number]  Arrival date and time: 03/02/20 1645   First Provider Initiated Contact with Patient 03/02/20 1750      Chief Complaint  Patient presents with  . Hemorrhoids   [name] is a 34 y.o. [id number] at [redacted]w[redacted]d who presents to MAU for rectal pain and bleeding. Patient reports she felt a painful bump "near where my cheeks split" and the only thing that would relieve it was a hot sitz bath, which she was doing multiple times in the last few days. Patient reports she was taking a bath this evening when she felt a pop and then saw blood and tissue in the tub. Patient reports she had a cyst like this when she was younger that needed to be packed, but she doesn't remember what she was diagnosed with. Patient is unsure if the bleeding is coming from the area of pain or the vagina.  Pt denies vaginal discharge/odor/itching. Pt denies N/V, abdominal pain, constipation, diarrhea, or urinary problems. Pt denies fever, chills, fatigue, sweating or changes in appetite. Pt denies SOB or chest pain. Pt denies dizziness, HA, light-headedness, weakness.  Allergies? NKDA Current medications/supplements? PNVs   OB History    Gravida  5   Para  3   Term  3   Preterm      AB  1   Living  3     SAB  1   TAB      Ectopic      Multiple  0   Live Births  3           Past Medical History:  Diagnosis Date  . ADD (attention deficit disorder)   . Complete miscarriage 09-12-06  . Factor V deficiency (HCC)   . LGSIL (low grade squamous intraepithelial dysplasia) 12-07 &3-08   C&B 3-08/CONDYLOMA TREATED 06-19-06  . Ovarian cyst   . Pulmonary embolus [facility])    age 89    Past Surgical History:  Procedure Laterality Date  . AUGMENTATION MAMMAPLASTY  08/2009  . CHOLECYSTECTOMY N/A 12/27/2014   Procedure: LAPAROSCOPIC CHOLECYSTECTOMY ;  Surgeon: Gaynelle Adu, MD;  Location: [facility] OR;  Service: General;  Laterality: N/A;  . DILITATION & CURRETTAGE/HYSTROSCOPY WITH  NOVASURE ABLATION N/A 02/12/2017   Procedure: DILATATION & CURETTAGE (no specimen),/HYSTEROSCOPY WITH NOVASURE ENDOMETRIAL ABLATION;  Surgeon: Tilda Burrow, MD;  Location: [facility]S;  Service: Gynecology;  Laterality: N/A;  . ERCP N/A 12/26/2014   Procedure: ENDOSCOPIC RETROGRADE CHOLANGIOPANCREATOGRAPHY (ERCP);  Surgeon: Dorena Cookey, MD;  Location: [facility] OR;  Service: Gastroenterology;  Laterality: N/A;  . INCISE AND DRAIN ABCESS  11-16-09   LEFT LABIA  . TONSILLECTOMY    . WISDOM TOOTH EXTRACTION      Family History  Problem Relation Age of Onset  . Diabetes Maternal Grandmother   . Hypertension Maternal Grandfather   . Cancer Maternal Grandfather        bladder  . Breast cancer Paternal Grandmother   . Cancer Paternal Grandmother        ovarian  . Cancer Paternal Grandfather        ? colon  . Asthma Father   . Thyroid disease Mother   . Thyroid disease Brother     Social History   Tobacco Use  . Smoking status: Current Every Day Smoker    Packs/day: 0.25    Years: 5.00    Pack years: 1.25    Last attempt to quit: 11/18/2016    Years  since quitting: 3.2  . Smokeless tobacco: Never Used  Vaping Use  . Vaping Use: Never used  Substance Use Topics  . Alcohol use: Yes    Comment: once a week  . Drug use: No    Allergies:  Allergies  Allergen Reactions  . Caffeine Anaphylaxis  . Peanut Butter Flavor Anaphylaxis    Medications Prior to Admission  Medication Sig Dispense Refill Last Dose  . amphetamine-dextroamphetamine (ADDERALL) 10 MG tablet Take 10 mg by mouth 2 (two) times daily with a meal.   Past Month at Unknown time  . cetirizine (ZYRTEC) 10 MG tablet Take 1 tablet (10 mg total) by mouth daily. 30 tablet 0 03/01/2020 at Unknown time  . fluticasone (FLONASE) 50 MCG/ACT nasal spray Place 2 sprays into both nostrils daily. 16 g 0 03/01/2020 at Unknown time    Review of Systems  Constitutional: Negative for chills, diaphoresis, fatigue and fever.  Eyes: Negative  for visual disturbance.  Respiratory: Negative for shortness of breath.   Cardiovascular: Negative for chest pain.  Gastrointestinal: Positive for rectal pain. Negative for abdominal pain, constipation, diarrhea, nausea and vomiting.  Genitourinary: Negative for dysuria, flank pain, frequency, pelvic pain, urgency, vaginal bleeding and vaginal discharge.  Neurological: Negative for dizziness, weakness, light-headedness and headaches.   Physical Exam   Blood pressure 106/68, pulse 87, temperature 98.6 F (37 C), resp. rate 19, weight 83 kg, last menstrual period 10/23/2019, SpO2 99 %.  Patient Vitals for the past 24 hrs:  BP Temp Pulse Resp SpO2 Weight  03/02/20 1739 106/68 -- 87 -- -- --  03/02/20 1735 -- -- -- -- 99 % --  03/02/20 1730 -- -- -- -- 99 % --  03/02/20 1711 115/62 98.6 F (37 C) 89 19 -- 83 kg   Physical Exam Vitals and nursing note reviewed. Exam conducted with a chaperone present.  Constitutional:      General: She is not in acute distress.    Appearance: Normal appearance. She is not ill-appearing, toxic-appearing or diaphoretic.  HENT:     Head: Normocephalic and atraumatic.  Pulmonary:     Effort: Pulmonary effort is normal.  Genitourinary:    General: Normal vulva.     Labia:        Right: No rash, tenderness or lesion.        Left: No rash or lesion.      Vagina: No signs of injury. No vaginal discharge, tenderness or bleeding.     Cervix: No friability, erythema or cervical bleeding.       Comments: No bleeding present on speculum exam. Neurological:     Mental Status: She is alert and oriented to person, place, and time.  Psychiatric:        Mood and Affect: Mood normal.        Behavior: Behavior normal.        Thought Content: Thought content normal.        Judgment: Judgment normal.     MAU Course  Procedures  MDM -likely pilonidal cyst -consulted with Dr. Mayford Knife, recommends application of lidocaine here in MAU, anusol and aloe vera for  home use and RX clindamycin with outpatient referral to GI -lidocaine applied in MAU and remaining tube sent home with patient -pt discharged to home in stable condition  Orders Placed This Encounter  Procedures  . Urinalysis, Routine w reflex microscopic Urine, Clean Catch    Standing Status:   Standing    Number of Occurrences:  1  . Ambulatory referral to Gastroenterology    Referral Priority:   Routine    Referral Type:   Consultation    Referral Reason:   Specialty Services Required    Number of Visits Requested:   1  . Discharge patient    Order Specific Question:   Discharge disposition    Answer:   01-Home or Self Care [1]    Order Specific Question:   Discharge patient date    Answer:   03/02/2020   Meds ordered this encounter  Medications  . lidocaine (XYLOCAINE) 5 % ointment  . clindamycin (CLEOCIN) 300 MG capsule    Sig: Take 1 capsule (300 mg total) by mouth 3 (three) times daily for 5 days.    Dispense:  15 capsule    Refill:  0    Order Specific Question:   Supervising Provider    Answer:   Conan Bowens [[id number]]   Assessment and Plan   1. Pilonidal cyst without abscess    Allergies as of 03/02/2020      Reactions   Caffeine Anaphylaxis   Peanut Butter Flavor Anaphylaxis      Medication List    TAKE these medications   amphetamine-dextroamphetamine 10 MG tablet Commonly known as: ADDERALL Take 10 mg by mouth 2 (two) times daily with a meal.   cetirizine 10 MG tablet Commonly known as: ZYRTEC Take 1 tablet (10 mg total) by mouth daily.   clindamycin 300 MG capsule Commonly known as: CLEOCIN Take 1 capsule (300 mg total) by mouth 3 (three) times daily for 5 days.   fluticasone 50 MCG/ACT nasal spray Commonly known as: FLONASE Place 2 sprays into both nostrils daily.       -RX Clindamycin 300mg  TID x5 days -recommend anusol, lidocaine (given from hospital), aloe vera -referral to GI -return MAU precautions -pt discharged to home in  stable condition  Lilliona Blakeney 03/02/2020, 6:48 PM

## 2020-03-02 NOTE — MAU Note (Signed)
Patient thought she had hemorrhoids and was doing treatments (sitz bath, positioning, etc)  States today one burst and saw a lot of blood.   Called her office and was sent in for evaluation.

## 2020-03-02 NOTE — Discharge Instructions (Signed)
Pilonidal Cyst  A pilonidal cyst is a fluid-filled sac that forms beneath the skin near the tailbone, at the top of the crease of the buttocks (pilonidal area). If the cyst is not large and not infected, it may not cause any problems. If the cyst becomes irritated or infected, it may get larger and fill with pus. An infected cyst is called an abscess. A pilonidal abscess may cause pain and swelling, and it may need to be drained or removed. What are the causes? The cause of this condition is not always known. In some cases, a hair that grows into your skin (ingrown hair) may be the cause. What increases the risk? You are more likely to get a pilonidal cyst if you:  Are female.  Have lots of hair near the crease of the buttocks.  Are overweight.  Have a dimple near the crease of the buttocks.  Wear tight clothing.  Do not bathe or shower often.  Sit for long periods of time. What are the signs or symptoms? Signs and symptoms of a pilonidal cyst may include pain, swelling, redness, and warmth in the pilonidal area. Depending on how big the cyst is, you may be able to feel a lump near your tailbone. If your cyst becomes infected, symptoms may include:  Pus or fluid drainage.  Fever.  Pain, swelling, and redness getting worse.  The lump getting bigger. How is this diagnosed? This condition may be diagnosed based on:  Your symptoms and medical history.  A physical exam.  A blood test to check for infection.  Testing a pus sample, if applicable. How is this treated? If your cyst does not cause symptoms, you may not need any treatment. If your cyst bothers you or is infected, you may need a procedure to drain or remove the cyst. Depending on the size, location, and severity of your cyst, your health care provider may:  Make an incision in the cyst and drain it (incision and drainage).  Open and drain the cyst, and then stitch the wound so that it stays open while it heals  (marsupialization). You will be given instructions about how to care for your open wound while it heals.  Remove all or part of the cyst, and then close the wound (cyst removal). You may need to take antibiotic medicines before your procedure. Follow these instructions at home: Medicines  Take over-the-counter and prescription medicines only as told by your health care provider.  If you were prescribed an antibiotic medicine, take it as told by your health care provider. Do not stop taking the antibiotic even if you start to feel better. General instructions  Keep the area around your pilonidal cyst clean and dry.  If there is fluid or pus draining from your cyst: ? Cover the area with a clean bandage (dressing) as needed. ? Wash the area gently with soap and water. Pat the area dry with a clean towel. Do not rub the area because that may cause bleeding.  Remove hair from the area around the cyst only if your health care provider tells you to do this.  Do not wear tight pants or sit in one position for long periods at a time.  Keep all follow-up visits as told by your health care provider. This is important. Contact a health care provider if you have:  New redness, swelling, or pain.  A fever.  Severe pain. Summary  A pilonidal cyst is a fluid-filled sac that forms beneath the   skin near the tailbone, at the top of the crease of the buttocks (pilonidal area).  If the cyst becomes irritated or infected, it may get larger and fill with pus. An infected cyst is called an abscess.  The cause of this condition is not always known. In some cases, a hair that grows into your skin (ingrown hair) may be the cause.  If your cyst does not cause symptoms, you may not need any treatment. If your cyst bothers you or is infected, you may need a procedure to drain or remove the cyst. This information is not intended to replace advice given to you by your health care provider. Make sure you  discuss any questions you have with your health care provider. Document Revised: 03/14/2017 Document Reviewed: 03/14/2017 Elsevier Patient Education  2020 [employer]  

## 2020-03-03 LAB — CULTURE, GROUP A STREP (THRC)

## 2020-03-07 ENCOUNTER — Other Ambulatory Visit: Payer: Self-pay

## 2020-03-07 ENCOUNTER — Ambulatory Visit
Admission: EM | Admit: 2020-03-07 | Discharge: 2020-03-07 | Disposition: A | Discharge status: Home / Self Care | Payer: Medicaid Other

## 2020-03-07 DIAGNOSIS — J040 Acute laryngitis: Secondary | ICD-10-CM | POA: Diagnosis not present

## 2020-03-07 NOTE — Discharge Instructions (Addendum)
Get plenty of rest and push fluids Rest your voice for a few days until symptom resolved Work note was given Follow-up with PCP or return to the urgent care if symptom does not resolve Use OTC medications like ibuprofen or tylenol as needed fever or pain Call or go to the ED if you have any new or worsening symptoms such as fever, worsening cough, shortness of breath, chest tightness, chest pain, turning blue, changes in mental status, etc..Marland Kitchen

## 2020-03-07 NOTE — ED Triage Notes (Signed)
Pt returns with continued fevers and loss of voice, covid and flu negative

## 2020-03-07 NOTE — ED Provider Notes (Signed)
[facility]   [id number] 03/07/20 Arrival Time: 1017   CC: COVID symptoms  SUBJECTIVE: History from: patient and family.  Peggy [name] is a 34 y.o. female who is pregnant presents to the urgent care with a complaint of loss of voice for the past 7 days.  She has tested negative for Covid and flu.  Reports symptom is getting worse.  Denies recent travel.  Has tried OTC medication without relief.  Denies aggravating factors.  Denies previous symptoms in the past.   Denies  fatigue, sinus pain, rhinorrhea, sore throat, SOB, wheezing, chest pain, nausea, changes in bowel or bladder habits.    ROS: As per HPI.  All other pertinent ROS negative.      Past Medical History:  Diagnosis Date  . ADD (attention deficit disorder)   . Complete miscarriage 09-12-06  . Factor V deficiency (HCC)   . LGSIL (low grade squamous intraepithelial dysplasia) 12-07 &3-08   C&B 3-08/CONDYLOMA TREATED 06-19-06  . Ovarian cyst   . Pulmonary embolus [facility])    age 6   Past Surgical History:  Procedure Laterality Date  . AUGMENTATION MAMMAPLASTY  08/2009  . CHOLECYSTECTOMY N/A 12/27/2014   Procedure: LAPAROSCOPIC CHOLECYSTECTOMY ;  Surgeon: Gaynelle Adu, MD;  Location: [facility] OR;  Service: General;  Laterality: N/A;  . DILITATION & CURRETTAGE/HYSTROSCOPY WITH NOVASURE ABLATION N/A 02/12/2017   Procedure: DILATATION & CURETTAGE (no specimen),/HYSTEROSCOPY WITH NOVASURE ENDOMETRIAL ABLATION;  Surgeon: Tilda Burrow, MD;  Location: [facility];  Service: Gynecology;  Laterality: N/A;  . ERCP N/A 12/26/2014   Procedure: ENDOSCOPIC RETROGRADE CHOLANGIOPANCREATOGRAPHY (ERCP);  Surgeon: Dorena Cookey, MD;  Location: [facility] OR;  Service: Gastroenterology;  Laterality: N/A;  . INCISE AND DRAIN ABCESS  11-16-09   LEFT LABIA  . TONSILLECTOMY    . WISDOM TOOTH EXTRACTION     Allergies  Allergen Reactions  . Caffeine Anaphylaxis  . Peanut Butter Flavor Anaphylaxis   No current facility-administered medications on file  prior to encounter.   Current Outpatient Medications on File Prior to Encounter  Medication Sig Dispense Refill  . amphetamine-dextroamphetamine (ADDERALL) 10 MG tablet Take 10 mg by mouth 2 (two) times daily with a meal.    . cetirizine (ZYRTEC) 10 MG tablet Take 1 tablet (10 mg total) by mouth daily. 30 tablet 0  . clindamycin (CLEOCIN) 300 MG capsule Take 1 capsule (300 mg total) by mouth 3 (three) times daily for 5 days. 15 capsule 0  . fluticasone (FLONASE) 50 MCG/ACT nasal spray Place 2 sprays into both nostrils daily. 16 g 0   Social History   Socioeconomic History  . Marital status: Legally Separated    Spouse name: Not on file  . Number of children: Not on file  . Years of education: Not on file  . Highest education level: Not on file  Occupational History  . Not on file  Tobacco Use  . Smoking status: Current Every Day Smoker    Packs/day: 0.25    Years: 5.00    Pack years: 1.25    Last attempt to quit: 11/18/2016    Years since quitting: 3.3  . Smokeless tobacco: Never Used  Vaping Use  . Vaping Use: Never used  Substance and Sexual Activity  . Alcohol use: Yes    Comment: once a week  . Drug use: No  . Sexual activity: Yes    Birth control/protection: Other-see comments    Comment: spouse had vasectomy  Other Topics Concern  . Not on file  Social History Narrative  . Not on file   Social Determinants of Health   Financial Resource Strain:   . Difficulty of Paying Living Expenses: Not on file  Food Insecurity:   . Worried About Programme researcher, broadcasting/film/video in the Last Year: Not on file  . Ran Out of Food in the Last Year: Not on file  Transportation Needs:   . Lack of Transportation (Medical): Not on file  . Lack of Transportation (Non-Medical): Not on file  Physical Activity:   . Days of Exercise per Week: Not on file  . Minutes of Exercise per Session: Not on file  Stress:   . Feeling of Stress : Not on file  Social Connections:   . Frequency of  Communication with Friends and Family: Not on file  . Frequency of Social Gatherings with Friends and Family: Not on file  . Attends Religious Services: Not on file  . Active Member of Clubs or Organizations: Not on file  . Attends Banker Meetings: Not on file  . Marital Status: Not on file  Intimate Partner Violence:   . Fear of Current or Ex-Partner: Not on file  . Emotionally Abused: Not on file  . Physically Abused: Not on file  . Sexually Abused: Not on file   Family History  Problem Relation Age of Onset  . Diabetes Maternal Grandmother   . Hypertension Maternal Grandfather   . Cancer Maternal Grandfather        bladder  . Breast cancer Paternal Grandmother   . Cancer Paternal Grandmother        ovarian  . Cancer Paternal Grandfather        ? colon  . Asthma Father   . Thyroid disease Mother   . Thyroid disease Brother     OBJECTIVE:  Vitals:   03/07/20 1048  BP: 117/80  Pulse: 69  Resp: 18  Temp: 98.1 F (36.7 C)  SpO2: 98%     General appearance: alert; appears fatigued, but nontoxic; speaking in full sentences and tolerating own secretions HEENT: NCAT; Ears: EACs clear, TMs pearly gray; Eyes: PERRL.  EOM grossly intact. Sinuses: nontender; Nose: nares patent without rhinorrhea, Throat: oropharynx clear, tonsils non erythematous or enlarged, uvula midline  Neck: supple without LAD Lungs: unlabored respirations, symmetrical air entry; cough: absent, mild; no respiratory distress; CTAB Heart: regular rate and rhythm.  Radial pulses 2+ symmetrical bilaterally Skin: warm and dry Psychological: alert and cooperative; normal mood and affect  LABS:  No results found for this or any previous visit (from the past 24 hour(s)).   ASSESSMENT & PLAN:  1. Laryngitis     No orders of the defined types were placed in this encounter.   Discharge instructions   Get plenty of rest and push fluids Rest your voice for a few days until symptom  resolved Work note was given Follow-up with PCP or return to the urgent care if symptom does not resolve Use OTC medications like ibuprofen or tylenol as needed fever or pain Call or go to the ED if you have any new or worsening symptoms such as fever, worsening cough, shortness of breath, chest tightness, chest pain, turning blue, changes in mental status, etc...   Reviewed expectations re: course of current medical issues. Questions answered. Outlined signs and symptoms indicating need for more acute intervention. Patient verbalized understanding. After Visit Summary given.         Durward Parcel, FNP 03/07/20 1138

## 2020-03-08 ENCOUNTER — Ambulatory Visit: Payer: Self-pay

## 2020-03-15 LAB — OB RESULTS CONSOLE GC/CHLAMYDIA
Chlamydia: NEGATIVE
Gonorrhea: NEGATIVE

## 2020-04-13 ENCOUNTER — Ambulatory Visit: Payer: Medicaid Other

## 2020-04-14 ENCOUNTER — Other Ambulatory Visit: Payer: Self-pay

## 2020-04-14 ENCOUNTER — Ambulatory Visit
Admission: RE | Admit: 2020-04-14 | Discharge: 2020-04-14 | Disposition: A | Discharge status: Home / Self Care | Payer: Medicaid Other | Source: Ambulatory Visit | Attending: Emergency Medicine | Admitting: Emergency Medicine

## 2020-04-14 DIAGNOSIS — Z1152 Encounter for screening for COVID-19: Secondary | ICD-10-CM | POA: Diagnosis not present

## 2020-04-14 NOTE — ED Triage Notes (Signed)
Needs covid test

## 2020-04-15 LAB — OB RESULTS CONSOLE HIV ANTIBODY (ROUTINE TESTING): HIV: NONREACTIVE

## 2020-04-15 LAB — OB RESULTS CONSOLE RUBELLA ANTIBODY, IGM: Rubella: IMMUNE

## 2020-04-15 LAB — OB RESULTS CONSOLE RPR: RPR: NONREACTIVE

## 2020-04-15 LAB — OB RESULTS CONSOLE HEPATITIS B SURFACE ANTIGEN: Hepatitis B Surface Ag: NEGATIVE

## 2020-04-15 LAB — HEPATITIS C ANTIBODY: HCV Ab: NEGATIVE

## 2020-04-19 LAB — COVID-19, FLU A+B NAA
Influenza A, NAA: NOT DETECTED
Influenza B, NAA: NOT DETECTED
SARS-CoV-2, NAA: NOT DETECTED

## 2020-05-05 ENCOUNTER — Other Ambulatory Visit: Payer: Self-pay

## 2020-05-05 ENCOUNTER — Inpatient Hospital Stay (HOSPITAL_COMMUNITY)
Admission: AD | Admit: 2020-05-05 | Discharge: 2020-05-05 | Disposition: A | Discharge status: Home / Self Care | Payer: Medicaid Other | Attending: Obstetrics & Gynecology | Admitting: Obstetrics & Gynecology

## 2020-05-05 ENCOUNTER — Inpatient Hospital Stay (HOSPITAL_BASED_OUTPATIENT_CLINIC_OR_DEPARTMENT_OTHER): Payer: Medicaid Other

## 2020-05-05 ENCOUNTER — Encounter (HOSPITAL_COMMUNITY): Payer: Self-pay | Admitting: Obstetrics & Gynecology

## 2020-05-05 DIAGNOSIS — O4692 Antepartum hemorrhage, unspecified, second trimester: Secondary | ICD-10-CM

## 2020-05-05 DIAGNOSIS — Z3A25 25 weeks gestation of pregnancy: Secondary | ICD-10-CM

## 2020-05-05 DIAGNOSIS — O26892 Other specified pregnancy related conditions, second trimester: Secondary | ICD-10-CM | POA: Diagnosis not present

## 2020-05-05 DIAGNOSIS — R102 Pelvic and perineal pain: Secondary | ICD-10-CM | POA: Diagnosis not present

## 2020-05-05 DIAGNOSIS — Z3686 Encounter for antenatal screening for cervical length: Secondary | ICD-10-CM | POA: Insufficient documentation

## 2020-05-05 DIAGNOSIS — Z3689 Encounter for other specified antenatal screening: Secondary | ICD-10-CM

## 2020-05-05 LAB — URINALYSIS, ROUTINE W REFLEX MICROSCOPIC
Bilirubin Urine: NEGATIVE
Glucose, UA: NEGATIVE mg/dL
Hgb urine dipstick: NEGATIVE
Ketones, ur: 5 mg/dL — AB
Leukocytes,Ua: NEGATIVE
Nitrite: NEGATIVE
Protein, ur: NEGATIVE mg/dL
Specific Gravity, Urine: 1.021 (ref 1.005–1.030)
pH: 5 (ref 5.0–8.0)

## 2020-05-05 LAB — CBC
HCT: 34.8 % — ABNORMAL LOW (ref 36.0–46.0)
Hemoglobin: 11.3 g/dL — ABNORMAL LOW (ref 12.0–15.0)
MCH: 29.2 pg (ref 26.0–34.0)
MCHC: 32.5 g/dL (ref 30.0–36.0)
MCV: 89.9 fL (ref 80.0–100.0)
Platelets: 220 10*3/uL (ref 150–400)
RBC: 3.87 MIL/uL (ref 3.87–5.11)
RDW: 12.7 % (ref 11.5–15.5)
WBC: 9.2 10*3/uL (ref 4.0–10.5)
nRBC: 0 % (ref 0.0–0.2)

## 2020-05-05 LAB — WET PREP, GENITAL
Clue Cells Wet Prep HPF POC: NONE SEEN
Sperm: NONE SEEN
Trich, Wet Prep: NONE SEEN
Yeast Wet Prep HPF POC: NONE SEEN

## 2020-05-05 LAB — TYPE AND SCREEN
ABO/RH(D): B POS
Antibody Screen: NEGATIVE

## 2020-05-05 MED ORDER — CYCLOBENZAPRINE HCL 10 MG PO TABS: 10.0000 mg | ORAL_TABLET | Freq: Three times a day (TID) | ORAL | 1 refills | Status: DC | PRN

## 2020-05-05 NOTE — Discharge Instructions (Signed)
Vaginal Bleeding During Pregnancy, Second Trimester A small amount of bleeding from the vagina, or spotting, is common during early pregnancy. Some bleeding may be related to the pregnancy, and some may not. In many cases, the bleeding is normal and is not a problem. However, bleeding can also be a sign of something serious. Normal things may cause bleeding during the second trimester. They include:  Rapid changes in blood vessels. This is caused by changes that are happening to the body during pregnancy.  Sex.  Pelvic exams. Abnormal things that may cause bleeding during the second trimester include:  Infection, inflammation, or growths on the cervix. The growths on the cervix are also called polyps.  A condition in which the placenta partially or completely covers the opening of the cervix (placenta previa).  The placenta separating from the uterus (placenta abruption).  Miscarriage.  Early labor, also called preterm labor.  The cervix opening and thinning before pregnancy is at term and before labor starts (cervical insufficiency).  A mass of tissue developing in the uterus due to an egg being fertilized incorrectly (molar pregnancy). Tell your health care provider about any vaginal bleeding right away. Follow these instructions at home: Monitoring your bleeding Monitor your bleeding.  Pay attention to any changes in your symptoms. Let your health care provider know about any concerns.  Try to understand when the bleeding occurs. Does the bleeding start on its own, or does it start after something is done, such as sex or a pelvic exam?  Use a diary to record the things you see about your bleeding, including: ? The kind of bleeding you are having. Does the bleeding start and stop irregularly, or is it a constant flow? ? The severity of your bleeding. Is the bleeding heavy or light? ? The number of pads you use each day, how often you change them, and how soaked they are.  Tell  your health care provider if you pass tissue. He or she may want to see it.   Activity  Follow instructions from your health care provider about limiting your activity. Ask what activities are safe for you.  Do not exercise or do activities that take a lot of effort unless your health care provider approves.  Do not have sex until your health care provider says that this is safe.  Do not lift anything that is heavier than 10 lb (4.5 kg), or the limit that you are told, until your health care provider says that it is safe.  If needed, make plans for someone to help with your regular activities. Medicines  Take over-the-counter and prescription medicines only as told by your health care provider.  Do not take aspirin because it can cause bleeding. General instructions  Do not use tampons or douche.  Keep all follow-up visits. This is important. Contact a health care provider if:  You have vaginal bleeding during any time of your pregnancy.  You have cramps or labor pains.  You have a fever that does not get better when you take medicines. Get help right away if:  You have severe cramps in your back or abdomen.  You have contractions.  You have chills.  Your bleeding increases or you pass large clots or a large amount of tissue from your vagina.  You feel light-headed or weak, or you faint.  You are leaking fluid or have a gush of fluid from your vagina. Summary  A small amount of bleeding, or spotting, from the vagina is   common during early pregnancy.  Many things can cause vaginal bleeding during pregnancy. Tell your health care provider about any bleeding right away.  Try to understand when bleeding occurs. Does bleeding occur on its own, or does it occur after something is done, such as sex or pelvic exams?  Follow instructions from your health care provider about limiting your activity. Ask what activities are safe for you.  Keep all follow-up visits. This is  important. This information is not intended to replace advice given to you by your health care provider. Make sure you discuss any questions you have with your health care provider. Document Revised: 12/17/2019 Document Reviewed: 12/17/2019 Elsevier Patient Education  2021 Elsevier Inc.  

## 2020-05-05 NOTE — MAU Note (Signed)
Pt report she started having pelvic pain and back pain that started this morning pain inbetween her thighs. Having a dark brown discharge and bleeding when she wipes

## 2020-05-06 LAB — GC/CHLAMYDIA PROBE AMP (~~LOC~~) NOT AT ARMC
Chlamydia: NEGATIVE
Comment: NEGATIVE
Comment: NORMAL
Neisseria Gonorrhea: NEGATIVE

## 2020-05-19 LAB — OB RESULTS CONSOLE RPR: RPR: NONREACTIVE

## 2020-06-11 ENCOUNTER — Inpatient Hospital Stay (HOSPITAL_BASED_OUTPATIENT_CLINIC_OR_DEPARTMENT_OTHER): Payer: Medicaid Other

## 2020-06-11 ENCOUNTER — Encounter (HOSPITAL_COMMUNITY): Payer: Self-pay | Admitting: Obstetrics and Gynecology

## 2020-06-11 ENCOUNTER — Other Ambulatory Visit: Payer: Self-pay

## 2020-06-11 ENCOUNTER — Inpatient Hospital Stay (HOSPITAL_COMMUNITY)
Admission: AD | Admit: 2020-06-11 | Discharge: 2020-06-28 | DRG: 784 | Disposition: A | Discharge status: Home / Self Care | Payer: Medicaid Other | Attending: Obstetrics and Gynecology | Admitting: Obstetrics and Gynecology

## 2020-06-11 DIAGNOSIS — O4703 False labor before 37 completed weeks of gestation, third trimester: Secondary | ICD-10-CM

## 2020-06-11 DIAGNOSIS — Z3A30 30 weeks gestation of pregnancy: Secondary | ICD-10-CM

## 2020-06-11 DIAGNOSIS — O42119 Preterm premature rupture of membranes, onset of labor more than 24 hours following rupture, unspecified trimester: Secondary | ICD-10-CM

## 2020-06-11 DIAGNOSIS — O42913 Preterm premature rupture of membranes, unspecified as to length of time between rupture and onset of labor, third trimester: Secondary | ICD-10-CM

## 2020-06-11 DIAGNOSIS — Z7901 Long term (current) use of anticoagulants: Secondary | ICD-10-CM

## 2020-06-11 DIAGNOSIS — O321XX0 Maternal care for breech presentation, not applicable or unspecified: Secondary | ICD-10-CM | POA: Diagnosis present

## 2020-06-11 DIAGNOSIS — O99334 Smoking (tobacco) complicating childbirth: Secondary | ICD-10-CM | POA: Diagnosis present

## 2020-06-11 DIAGNOSIS — O9912 Other diseases of the blood and blood-forming organs and certain disorders involving the immune mechanism complicating childbirth: Secondary | ICD-10-CM | POA: Diagnosis present

## 2020-06-11 DIAGNOSIS — O42919 Preterm premature rupture of membranes, unspecified as to length of time between rupture and onset of labor, unspecified trimester: Secondary | ICD-10-CM | POA: Diagnosis present

## 2020-06-11 DIAGNOSIS — Z86711 Personal history of pulmonary embolism: Secondary | ICD-10-CM | POA: Diagnosis not present

## 2020-06-11 DIAGNOSIS — Z302 Encounter for sterilization: Secondary | ICD-10-CM | POA: Diagnosis not present

## 2020-06-11 DIAGNOSIS — Z3A32 32 weeks gestation of pregnancy: Secondary | ICD-10-CM | POA: Diagnosis not present

## 2020-06-11 DIAGNOSIS — D6851 Activated protein C resistance: Secondary | ICD-10-CM | POA: Diagnosis present

## 2020-06-11 DIAGNOSIS — O26893 Other specified pregnancy related conditions, third trimester: Secondary | ICD-10-CM | POA: Diagnosis present

## 2020-06-11 DIAGNOSIS — Z3A31 31 weeks gestation of pregnancy: Secondary | ICD-10-CM | POA: Diagnosis not present

## 2020-06-11 DIAGNOSIS — Z8759 Personal history of other complications of pregnancy, childbirth and the puerperium: Secondary | ICD-10-CM

## 2020-06-11 DIAGNOSIS — O4290 Premature rupture of membranes, unspecified as to length of time between rupture and onset of labor, unspecified weeks of gestation: Secondary | ICD-10-CM

## 2020-06-11 DIAGNOSIS — F17210 Nicotine dependence, cigarettes, uncomplicated: Secondary | ICD-10-CM | POA: Diagnosis present

## 2020-06-11 LAB — CBC
HCT: 35.7 % — ABNORMAL LOW (ref 36.0–46.0)
Hemoglobin: 12 g/dL (ref 12.0–15.0)
MCH: 30 pg (ref 26.0–34.0)
MCHC: 33.6 g/dL (ref 30.0–36.0)
MCV: 89.3 fL (ref 80.0–100.0)
Platelets: 218 10*3/uL (ref 150–400)
RBC: 4 MIL/uL (ref 3.87–5.11)
RDW: 13.3 % (ref 11.5–15.5)
WBC: 8.8 10*3/uL (ref 4.0–10.5)
nRBC: 0 % (ref 0.0–0.2)

## 2020-06-11 LAB — URINALYSIS, COMPLETE (UACMP) WITH MICROSCOPIC
Bilirubin Urine: NEGATIVE
Glucose, UA: 50 mg/dL — AB
Hgb urine dipstick: NEGATIVE
Ketones, ur: NEGATIVE mg/dL
Leukocytes,Ua: NEGATIVE
Nitrite: NEGATIVE
Protein, ur: 30 mg/dL — AB
Specific Gravity, Urine: 1.018 (ref 1.005–1.030)
pH: 6 (ref 5.0–8.0)

## 2020-06-11 LAB — COMPREHENSIVE METABOLIC PANEL
ALT: 14 U/L (ref 0–44)
AST: 25 U/L (ref 15–41)
Albumin: 2.8 g/dL — ABNORMAL LOW (ref 3.5–5.0)
Alkaline Phosphatase: 72 U/L (ref 38–126)
Anion gap: 9 (ref 5–15)
BUN: 7 mg/dL (ref 6–20)
CO2: 18 mmol/L — ABNORMAL LOW (ref 22–32)
Calcium: 8.9 mg/dL (ref 8.9–10.3)
Chloride: 106 mmol/L (ref 98–111)
Creatinine, Ser: 0.55 mg/dL (ref 0.44–1.00)
GFR, Estimated: >60 mL/min (ref >60)
Glucose, Bld: 94 mg/dL (ref 70–99)
Potassium: 4.1 mmol/L (ref 3.5–5.1)
Sodium: 133 mmol/L — ABNORMAL LOW (ref 135–145)
Total Bilirubin: 0.5 mg/dL (ref 0.3–1.2)
Total Protein: 5.9 g/dL — ABNORMAL LOW (ref 6.5–8.1)

## 2020-06-11 LAB — TYPE AND SCREEN
ABO/RH(D): B POS
Antibody Screen: NEGATIVE

## 2020-06-11 LAB — GROUP B STREP BY PCR: Group B strep by PCR: NEGATIVE

## 2020-06-11 LAB — POCT FERN TEST: POCT Fern Test: POSITIVE

## 2020-06-11 MED ORDER — CALCIUM CARBONATE ANTACID 500 MG PO CHEW
2.0000 | CHEWABLE_TABLET | ORAL | Status: DC | PRN
  Administered 2020-06-19 – 2020-06-24 (×2): 400 mg via ORAL
  Filled 2020-06-11 (×2): qty 2

## 2020-06-11 MED ORDER — DOCUSATE SODIUM 100 MG PO CAPS
100.0000 mg | ORAL_CAPSULE | Freq: Every day | ORAL | Status: DC
  Administered 2020-06-18: 100 mg via ORAL
  Filled 2020-06-11 (×9): qty 1

## 2020-06-11 MED ORDER — SODIUM CHLORIDE 0.9 % IV SOLN
2.0000 g | Freq: Four times a day (QID) | INTRAVENOUS | Status: AC
  Administered 2020-06-11 – 2020-06-13 (×8): 2 g via INTRAVENOUS
  Filled 2020-06-11 (×8): qty 2000

## 2020-06-11 MED ORDER — AZITHROMYCIN 250 MG PO TABS
1000.0000 mg | ORAL_TABLET | Freq: Once | ORAL | Status: AC
  Administered 2020-06-11: 1000 mg via ORAL
  Filled 2020-06-11: qty 4

## 2020-06-11 MED ORDER — HEPARIN SODIUM (PORCINE) 10000 UNIT/ML IJ SOLN
10000.0000 [IU] | Freq: Two times a day (BID) | INTRAMUSCULAR | Status: DC
  Administered 2020-06-11 – 2020-06-25 (×29): 10000 [IU] via SUBCUTANEOUS
  Filled 2020-06-11 (×12): qty 1
  Filled 2020-06-11: qty 2
  Filled 2020-06-11 (×20): qty 1

## 2020-06-11 MED ORDER — PRENATAL MULTIVITAMIN CH: 1.0000 | ORAL_TABLET | Freq: Every day | ORAL | Status: DC

## 2020-06-11 MED ORDER — BETAMETHASONE SOD PHOS & ACET 6 (3-3) MG/ML IJ SUSP
12.0000 mg | INTRAMUSCULAR | Status: AC
  Administered 2020-06-11 – 2020-06-12 (×2): 12 mg via INTRAMUSCULAR
  Filled 2020-06-11 (×2): qty 5

## 2020-06-11 MED ORDER — COMPLETENATE 29-1 MG PO CHEW: 1.0000 | CHEWABLE_TABLET | Freq: Every day | ORAL | Status: DC

## 2020-06-11 MED ORDER — ZOLPIDEM TARTRATE 5 MG PO TABS: 5.0000 mg | ORAL_TABLET | Freq: Every evening | ORAL | Status: DC | PRN

## 2020-06-11 MED ORDER — AZITHROMYCIN 1 G PO PACK: 1.0000 g | PACK | Freq: Once | ORAL | Status: DC

## 2020-06-11 MED ORDER — COMPLETENATE 29-1 MG PO CHEW
1.0000 | CHEWABLE_TABLET | Freq: Every day | ORAL | Status: DC
  Administered 2020-06-12 – 2020-06-26 (×15): 1 via ORAL
  Filled 2020-06-11 (×16): qty 1

## 2020-06-11 MED ORDER — ACETAMINOPHEN 325 MG PO TABS
650.0000 mg | ORAL_TABLET | ORAL | Status: DC | PRN
  Administered 2020-06-19: 650 mg via ORAL
  Filled 2020-06-11: qty 2

## 2020-06-11 NOTE — MAU Note (Signed)
Peggy [name] is a 35 y.o. at [redacted]w[redacted]d here in MAU reporting: LOF since 0900, states she is still leaking. Fluid is clear. Having irregular contractions. No bleeding. +FM  Onset of complaint: today  Pain score: 6/10  Vitals:   06/11/20 1148  BP: 137/84  Pulse: (!) 114  Resp: 16  Temp: 98.3 F (36.8 C)  SpO2: 99%     FHT: +FM  Lab orders placed from triage: none

## 2020-06-11 NOTE — H&P (Signed)
  S: Peggy [name] is a 35Y [phone] at [redacted]w[redacted]d who presented to MAU with leakage of fluid that started at 0900 this morning while she was sleeping. States she had a gush of fluid followed by continuous leakage of clear fluid since that time. Has felt 2 mild contractions over the past 5 hours. She reports good fetal movement and denies vaginal bleeding.   Her pregnancy has been complicated by: 1. Late to prenatal care: her dating [place] was done at [redacted]w[redacted]d, uncertain LMP.  2. History of PE/Factor V Leiden: PE at age 35, is on Lovenox 40mg  BID, which she did not inject this morning. Was on Lovenox 40mg  BID for all previous pregnancies. 3. History of endometrial ablation: in 2018, has not been on [facility] since. Anatomy 2019 notes anterior placenta with "Adhesion noted in LUS from ablation".   OB history significant for full term SVD x 3 (2009, 2015, 2017)  O: Vitals:   06/11/20 1148  BP: 137/84  Pulse: (!) 114  Resp: 16  Temp: 98.3 F (36.8 C)  TempSrc: Oral  SpO2: 99%  Weight: 92.8 kg   General: well appearing, comfortable, pleasant Abd: gravid, soft, nontender Back: slight right lumbar tenderness to palpation, no CVA tenderness Ext: no calf edema or tenderness Per MAU CNM: SSE: +pool, fern pos; visually closed.  BSUS: breech SVE deferred  NST: FHR 135bpm, moderate variability, + 10x10 accels, no decels Toco: 1 ctx in 1 hour, otherwise irritability not felt by patient   A/p: Peggy [name] 35 y.o. [name] at [redacted]w[redacted]d with PPROM confirmed in MAU  1. Admit to [facility] 2. PPROM without signs of infection or labor - Admission labs: CBC, UA, NG/CT, Group B strep PCR - Monitor for signs of infection with vitals, daily exams - Start latency antibiotics: Azithro 1g plus Ampicillin 2g q6h x 48 hours, followed by Amoxicillin 875mg  q 12hrs x 5 days - Plan for delivery at 34 weeks or earlier if labor, infection, or other maternal or fetal indications arise 3. Preterm - Betamethasone course  (3/5-3/6), eligible for rescue course if anticipate delivery > 7 days from initial course and < 34 weeks. If imminent delivery and <32 weeks, magnesium for neuroprotection. - Growth [redacted]w[redacted]d requested - Will request NICU consult 4. H/o PE/Factor V Leiden on Lovenox 40mg  BID - Will change to heparin 10,000U BID given risk of delivery  - SCDs 5. H/o endometrial ablation - Will assess placenta with growth [facility] today - Patient aware of risk of placenta accreta spectrum and hysterectomy at time of delivery - Admission Hgb 12.0 6. Mode of delivery: - Currently breech, patient aware if baby is breech when delivery is indicated, mode of delivery would be cesarean. If cesarean, patient desires permanent sterilization. We discussed the risks/benefits of tubal ligation vs. salpingectomy and wishes to have bilateral salpingectomy 7. Fetal monitoring - NST daily with fetal heart rate check q shift - Weekly BPP   I reviewed this plan of care with Dr. , MFM, who will make any additional recommendations after ultrasound. Francesca and her husband report understanding and agreement with plan of care.   06/11/20 2:19 PM

## 2020-06-11 NOTE — Consult Note (Signed)
MFM Note Peggy [name] is a 35 year old gravida 5 para 3 currently at 30 weeks and 1 day. She was admitted due to PPROM. She has 3 prior full-term vaginal deliveries. She ruptured membranes this morning. Rupture of membranes was confirmed in the MAU.  Her past medical history is significant for a pulmonary embolus at age 39. 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 Due to this history, she has been treated with Lovenox 40 mg twice a day throughout her current pregnancy. She also underwent an endometrial ablation in 2018.   The patient had an ultrasound performed this afternoon that showed an overall EFW of 3 pounds 15 ounces (62nd percentile for her gestational age).  The total AFI was 10.4 cm was. The fetus was in the breech presentation. A normal appearing anterior placenta was noted. There were no sonographic signs of placenta accreta noted. The views of the fetal anatomy were limited due to her advanced gestational age.   Due to preterm premature rupture of membranes, she will require inpatient management until delivery with daily fetal testing.  She should receive a complete course of antenatal corticosteroids and complete a course of latency antibiotics. Delivery is recommended at around 34 weeks.  Delivery prior to this time will be indicated should she go into spontaneous labor, should she show any signs of an intrauterine infection, or at any time for nonreassuring fetal status.  Should she require delivery before 32 weeks, magnesium sulfate should be given for fetal neuroprotection.    Should she be at risk for delivery prior to 34 weeks and it has been 7 days or greater since she received the initial course of antenatal corticosteroids, a rescue course of steroids should be given.  Due to her history of thrombophilia and a prior pulmonary embolus, I would recommend that she be placed on heparin 10,000 units twice a day for prophylaxis. Subcutaneous heparin  is recommended rather than Lovenox as the effects of heparin may be reversed should she require delivery.  Recommendations: -Inpatient management until delivery -Daily fetal testing -Weekly biophysical profiles with fluid checks -Latency antibiotics -Heparin 10,000 units BID -Magnesium sulfate for fetal neuro protection should she require delivery before 32 weeks -Rescue course of steroids as indicated -Delivery at around 34 weeks  -Delivery prior to 34 weeks would be indicated: should she go into spontaneous labor should she show any signs of an intrauterine infection at any time for nonreassuring fetal status

## 2020-06-11 NOTE — MAU Provider Note (Addendum)
History   [id number]   Chief Complaint  Patient presents with  . Contractions  . Rupture of Membranes    HPI Peggy [name] is a 35 y.o. female  [phone] @30 .6 wks here with report of gush of clear fluid at 0900.  Leaking of fluid has continued. Pt reports rare contractions. She denies vaginal bleeding. Last intercourse was not recent. She reports good fetal movement. All other systems negative.    Patient's last menstrual period was 01/14/2020.  OB History  Gravida Para Term Preterm AB Living  5 3 3   1 3   SAB IAB Ectopic Multiple Live Births  1     0 3    # Outcome Date GA Lbr Len/2nd Weight Sex Delivery Anes PTL Lv  5 Current           4 Term 10/02/15 [redacted]w[redacted]d 02:14 / 00:02 3039 g 10/04/15 EPI  LIV  3 Term 10/04/13 [redacted]w[redacted]d 03:05 / 00:21 3385 g F Vag-Spont EPI    2 Term 2009 [redacted]w[redacted]d  2977 g F Vag-Spont   LIV  1 SAB 2007            Past Medical History:  Diagnosis Date  . ADD (attention deficit disorder)   . Complete miscarriage 09-12-06  . Factor V deficiency (HCC)   . LGSIL (low grade squamous intraepithelial dysplasia) 12-07 &3-08   C&B 3-08/CONDYLOMA TREATED 06-19-06  . Ovarian cyst   . Pulmonary embolus [facility])    age 63    Family History  Problem Relation Age of Onset  . Diabetes Maternal Grandmother   . Hypertension Maternal Grandfather   . Cancer Maternal Grandfather        bladder  . Breast cancer Paternal Grandmother   . Cancer Paternal Grandmother        ovarian  . Cancer Paternal Grandfather        ? colon  . Asthma Father   . Thyroid disease Mother   . Thyroid disease Brother     Social History   Socioeconomic History  . Marital status: Legally Separated    Spouse name: Not on file  . Number of children: Not on file  . Years of education: Not on file  . Highest education level: Not on file  Occupational History  . Not on file  Tobacco Use  . Smoking status: Current Every Day Smoker    Packs/day: 0.25    Years: 5.00    Pack years: 1.25     Last attempt to quit: 11/18/2016    Years since quitting: 3.5  . Smokeless tobacco: Never Used  Vaping Use  . Vaping Use: Former  . Quit date: 02/07/2020  Substance and Sexual Activity  . Alcohol use: Yes    Comment: once a week  . Drug use: No  . Sexual activity: Yes    Birth control/protection: Other-see comments    Comment: spouse had vasectomy  Other Topics Concern  . Not on file  Social History Narrative  . Not on file   Social Determinants of Health   Financial Resource Strain: Not on file  Food Insecurity: Not on file  Transportation Needs: Not on file  Physical Activity: Not on file  Stress: Not on file  Social Connections: Not on file    Allergies  Allergen Reactions  . Caffeine Anaphylaxis  . Peanut Butter Flavor Anaphylaxis    No current facility-administered medications on file prior to encounter.   Current Outpatient Medications on File  Prior to Encounter  Medication Sig Dispense Refill  . amphetamine-dextroamphetamine (ADDERALL) 10 MG tablet Take 10 mg by mouth 2 (two) times daily with a meal.    . cetirizine (ZYRTEC) 10 MG tablet Take 1 tablet (10 mg total) by mouth daily. 30 tablet 0  . cyclobenzaprine (FLEXERIL) 10 MG tablet Take 1 tablet (10 mg total) by mouth every 8 (eight) hours as needed for muscle spasms. 30 tablet 1  . fluticasone (FLONASE) 50 MCG/ACT nasal spray Place 2 sprays into both nostrils daily. 16 g 0  . Prenatal MV & Min w/FA-DHA (PRENATAL GUMMIES PO) Take 1 tablet by mouth daily.       Review of Systems  Gastrointestinal: Negative for abdominal pain.  Genitourinary: Positive for vaginal discharge. Negative for vaginal bleeding.   Physical Exam   Vitals:   06/11/20 1148  BP: 137/84  Pulse: (!) 114  Resp: 16  Temp: 98.3 F (36.8 C)  TempSrc: Oral  SpO2: 99%  Weight: 92.8 kg    Physical Exam Constitutional:      General: She is not in acute distress.    Appearance: Normal appearance.  HENT:     Head: Normocephalic and  atraumatic.  Pulmonary:     Effort: Pulmonary effort is normal. No respiratory distress.  Abdominal:     Palpations: Abdomen is soft.     Tenderness: There is no abdominal tenderness.     Comments: gravid  Genitourinary:    Comments: SSE: +pool, fern pos; visually closed Musculoskeletal:        General: Normal range of motion.     Cervical back: Normal range of motion.  Skin:    General: Skin is warm and dry.  Neurological:     General: No focal deficit present.     Mental Status: She is alert and oriented to person, place, and time.  Psychiatric:        Mood and Affect: Mood normal.        Behavior: Behavior normal.   EFM: 145 bpm, mod variability, + accels, no decels Toco: UI BSUS: breech  Results for orders placed or performed during the hospital encounter of 06/11/20 (from the past 24 hour(s))  Fern Test     Status: Normal   Collection Time: 06/11/20 12:24 PM  Result Value Ref Range   POCT Fern Test Positive = ruptured amniotic membanes    MAU Course  Procedures  MDM Review of prenatal records>pregnancy complicated by hx of PE on Lovenox. Labs ordered and reviewed. PPROM confirmed. Dr. Timothy Lasso notified of presentation and clinical findings.   Assessment and Plan  Admit to OBCS unit [facility] per Dr. Yolonda Kida, [place], [facility] 06/11/2020 12:37 PM

## 2020-06-12 MED ORDER — AMOXICILLIN 500 MG PO CAPS
500.0000 mg | ORAL_CAPSULE | Freq: Three times a day (TID) | ORAL | Status: AC
  Administered 2020-06-13 – 2020-06-18 (×15): 500 mg via ORAL
  Filled 2020-06-12 (×15): qty 1

## 2020-06-12 MED ORDER — CAMPHOR-MENTHOL 0.5-0.5 % EX LOTN
TOPICAL_LOTION | CUTANEOUS | Status: DC | PRN
  Filled 2020-06-12: qty 222

## 2020-06-12 MED ORDER — AMOXICILLIN 500 MG PO CAPS: 500.0000 mg | ORAL_CAPSULE | Freq: Three times a day (TID) | ORAL | Status: DC

## 2020-06-12 NOTE — Progress Notes (Signed)
Peggy [name] 35 y.o. [id number] at [redacted]w[redacted]d HD#2 admitted with PPROM S: [name] is doing well this morning. She reports good fetal movement and continued leakage of clear fluid. No vaginal bleeding or contractions. Continues to endorse mild right lower back pain that is worse with movement. No fevers/chills or abdominal pain. No chest pain, shortness of breath, or calf pain.    O: Vitals:   06/11/20 2018 06/11/20 2303 06/12/20 0417 06/12/20 0811  BP: 130/90 128/79 115/73 128/85  Pulse: 95 (!) 101 83 (!) 102  Resp: 18 16 17 18   Temp: 97.9 F (36.6 C) 98.2 F (36.8 C) 97.9 F (36.6 C) 97.9 F (36.6 C)  TempSrc: Oral Oral Oral Oral  SpO2: 100% 100% 98% 100%  Weight:      Height:        General: no acute distress, resting in bed Abd: gravid, soft, nontender Back: minimal right lower lumbar tenderness, no CVA tenderness Ext; no calf edema or tenderness  NST reactive yesterday, pending this AM  A/p: AMEIA MORENCY 35 y.o. 33 at [redacted]w[redacted]d HD#2 admitted with PPROM  1. PPROM without signs of infection or labor - Admission WBC 8.8 - Monitor for signs of infection with vitals, daily exams - Continue latency antibiotics: s/p, Azithro 1g, continue Ampicillin 2g q6h x 48 hours, followed by Amoxicillin 500mg  q8hrs x 5 days - Plan for delivery around 34 weeks or earlier if labor, infection, or other maternal or fetal indications arise 2. Preterm - S/p MFM consult - Betamethasone course (3/5-3/6), eligible for rescue course if anticipate delivery > 7 days from initial course and < 34 weeks. If imminent delivery and <32 weeks, magnesium for neuroprotection. - Growth [redacted]w[redacted]d 3/5: breech, 1788g (3lb15oz), AFI 10.4cm, normal appearing anterior placenta without sonographic signs of placenta accreta, cervix appears funneled with bulging membrane in the cervical canal noted. - NICU consult requested 3. H/o PE/Factor V Leiden, previously on Lovenox 40mg  BID - Changed to heparin 10,000U BID given risk of  delivery - SCDs 4. H/o endometrial ablation - Normal appearing placenta on 3/5 [place] without sonographic signs of placenta accreta - Patient aware of risk of placenta accreta spectrum and hysterectomy at time of delivery - Admission Hgb 12.0 5. Mode of delivery: - Currently breech, patient aware if baby is breech when delivery is indicated, mode of delivery would be cesarean. If cesarean, patient desires permanent sterilization. We discussed the risks/benefits of tubal ligation vs. salpingectomy and wishes to have bilateral salpingectomy.  6. Fetal monitoring - NST daily with fetal heart rate check q shift - Weekly BPP  7. Lower back pain c/w MSK etiology  - declined muscle relaxant, will try heat/ice, camphor/menthol topical, egg crate topper ordered for mattress.  Dispo: remaining inpatient until delivery  06/12/20 8:51 AM

## 2020-06-12 NOTE — Consult Note (Signed)
Asked by Dr.Marinone to provide prenatal consultation for 35 y.o. G5 P3 who is now 30.[redacted] weeks EGA who was admitted yesterday after PROM. Pregnancy also complicated by Factor V Leiden and she was on Lovenox before being changed to heparin after admission.  She is being treated with betamethasone and will get her 2nd dose shortly and she is on latency antibiotics. The plan, in consultation with MFM, is to defer delivery until 34 wks if possible.  Discussed with patient usual expectations for preterm infant at 30+ weeks gestation, including possible needs for DR resuscitation, respiratory support, IV access, and blood products.  Explained family-centered care approach including parent participation in rounds, decision-making, and also presented usual criteria for discharge. Projected possible length of stay in NICU until around the [facility].  Discussed advantages of feeding with mother's milk and possible use of donor milk as "bridge" if needed until her supply is sufficient. She breast fed her other children and plans to do with this infant.  Patient was attentive, had appropriate questions, and was appreciative of my input.  Thank you for consulting Neonatology.  Total time 25 minutes, face-to-face time 20 minutes  JWimmer, MD

## 2020-06-13 LAB — GC/CHLAMYDIA PROBE AMP (~~LOC~~) NOT AT ARMC
Chlamydia: NEGATIVE
Comment: NEGATIVE
Comment: NORMAL
Neisseria Gonorrhea: NEGATIVE

## 2020-06-13 NOTE — Progress Notes (Signed)
NST reactive, baseline 140s, moderate variability, no decelerations

## 2020-06-13 NOTE — Progress Notes (Signed)
Peggy [name] 35 y.o. [id number] at [redacted]w[redacted]d HD#3 admitted with PPROM S: Peggy [name] is doing well this morning. She reports good fetal movement and continued leakage of clear fluid. She had an episode of pink tinged mucus discharge, but no continued spotting or bleeding.  No abdominal tenderness. No fevers/chills.  No chest pain, shortness of breath, or calf pain.    O: Vitals:   06/12/20 1921 06/12/20 2240 06/13/20 0400 06/13/20 0753  BP: 120/77 125/83 101/70 116/72  Pulse: 91 90 92 71  Resp: 17 17 17 18   Temp: 97.7 F (36.5 C) 98.4 F (36.9 C) 98.2 F (36.8 C) 98.5 F (36.9 C)  TempSrc: Oral Oral Oral Oral  SpO2: 99% 97% 98% 99%  Weight:      Height:        General: no acute distress, resting in bed Abd: gravid, soft, nontender Ext; no calf edema or tenderness  NST reactive yesterday evening, pending this AM  A/p: Peggy [name] 35 y.o. 33 at [redacted]w[redacted]d HD#3 admitted with PPROM  1. PPROM without signs of infection or labor - Admission WBC 8.8 - Monitor for signs of infection with vitals, daily exams - Continue latency antibiotics: s/p, Azithro 1g, continue Ampicillin 2g q6h x 48 hours, followed by Amoxicillin 500mg  q8hrs x 5 days - Plan for delivery around 34 weeks or earlier if labor, infection, or other maternal or fetal indications arise 2. Preterm - S/p MFM consult - Betamethasone course (3/5-3/6), eligible for rescue course if anticipate delivery > 7 days from initial course and < 34 weeks. If imminent delivery and <32 weeks, magnesium for neuroprotection. - Growth [redacted]w[redacted]d 3/5: breech, 1788g (3lb15oz), AFI 10.4cm, normal appearing anterior placenta without sonographic signs of placenta accreta, cervix appears funneled with bulging membrane in the cervical canal noted. - s/p NICU consult  3. H/o PE/Factor V Leiden, previously on Lovenox 40mg  BID - Changed to heparin 10,000U BID given risk of delivery - SCDs 4. H/o endometrial ablation - Normal appearing placenta on 3/5 [place] without  sonographic signs of placenta accreta - Patient aware of risk of placenta accreta spectrum and hysterectomy at time of delivery - Admission Hgb 12.0 5. Mode of delivery: - Currently breech, patient aware if baby is breech when delivery is indicated, mode of delivery would be cesarean. If cesarean, patient desires permanent sterilization. We discussed the risks/benefits of tubal ligation vs. salpingectomy and wishes to have bilateral salpingectomy.  6. Fetal monitoring - NST daily with fetal heart rate check q shift - Weekly BPP   Dispo: remaining inpatient until delivery  [facility] GEFFEL [facility] 06/13/20 9:31 AM

## 2020-06-14 LAB — TYPE AND SCREEN
ABO/RH(D): B POS
Antibody Screen: NEGATIVE

## 2020-06-14 LAB — RPR: RPR Ser Ql: NONREACTIVE

## 2020-06-14 NOTE — Progress Notes (Signed)
Peggy [name] 35 y.o. [id number] at [redacted]w[redacted]d HD#4 admitted with PPROM  S: Pt doing well, without complaint. Active FM, continue LOF. No significant vaginal bleeding. Denies F/C, abdominal tenderness  Peggy [name] is doing well this morning. She reports good fetal movement and continued leakage of clear fluid. She had an episode of pink tinged mucus discharge, but no continued spotting or bleeding.  No abdominal tenderness. No fevers/chills.  No chest pain, shortness of breath, or calf pain.    O: Vitals:   06/13/20 1932 06/13/20 2319 06/14/20 0546 06/14/20 0738  BP: 134/89 124/83 113/79 124/84  Pulse: 97 78 72 73  Resp: 18 18 18 18   Temp: 98 F (36.7 C) 98.5 F (36.9 C) 98.7 F (37.1 C) 98.2 F (36.8 C)  TempSrc: Oral Oral Oral Oral  SpO2: 100% 99% 99% 98%  Weight:      Height:        General: no acute distress, resting in bed Abd: gravid, soft, nontender Ext; no calf edema or tenderness  NST reactive yesterday, pending today FHR 150s  A/p: Peggy [name] 35 y.o. 32 at [redacted]w[redacted]d HD#4 admitted with PPROM  1. PPROM without signs of infection or labor - Admission WBC 8.8 - Monitor for signs of infection with vitals, daily exams - Continue latency antibiotics: s/p, Azithro 1g, continue Ampicillin 2g q6h x 48 hours, followed by Amoxicillin 500mg  q8hrs x 5 days - Plan for delivery around 34 weeks or earlier if labor, infection, or other maternal or fetal indications arise 2. Preterm - S/p MFM consult - Betamethasone course (3/5-3/6), eligible for rescue course if anticipate delivery > 7 days from initial course and < 34 weeks. If imminent delivery and <32 weeks, magnesium for neuroprotection. - Growth [redacted]w[redacted]d 3/5: breech, 1788g (3lb15oz), AFI 10.4cm, normal appearing anterior placenta without sonographic signs of placenta accreta, cervix appears funneled with bulging membrane in the cervical canal noted. - s/p NICU consult  3. H/o PE/Factor V Leiden, previously on Lovenox 40mg  BID - Changed  to heparin 10,000U BID given risk of delivery - SCDs - Discussed with patient the general anesthesia may be required for cesarean delivery, depending on timing of anticoagulation dose 4. H/o endometrial ablation - Normal appearing placenta on 3/5 [place] without sonographic signs of placenta accreta - Patient aware of risk of placenta accreta spectrum and hysterectomy at time of delivery - Admission Hgb 12.0 - Maintain active T & S 5. Mode of delivery: - Currently breech, patient aware if baby is breech when delivery is indicated, mode of delivery would be cesarean. If cesarean, patient desires permanent sterilization. We discussed the risks/benefits of tubal ligation vs. salpingectomy and wishes to have bilateral salpingectomy.  6. Fetal monitoring - NST daily with fetal heart rate check q shift - Weekly BPP   Dispo: remaining inpatient until delivery  06/14/20 11:35 AM

## 2020-06-15 ENCOUNTER — Encounter (HOSPITAL_COMMUNITY): Payer: Self-pay | Admitting: Obstetrics and Gynecology

## 2020-06-15 ENCOUNTER — Encounter (HOSPITAL_COMMUNITY): Payer: Self-pay | Admitting: Anesthesiology

## 2020-06-15 NOTE — Progress Notes (Signed)
Peggy [name] 35 y.o. [id number] at [redacted]w[redacted]d HD#5 admitted with PPROM Pt doing well, without complaint. Active FM, continue LOF. No vaginal bleeding. Denies F/C, abdominal tenderness or contractions.  She denies chest pain/SOB or calf pain.  Vitals:   06/14/20 1958 06/14/20 1959 06/15/20 0653 06/15/20 0829  BP: 119/79  115/76 118/79  Pulse: 90  68 85  Resp: 18  18 18   Temp: 97.7 F (36.5 C)  97.6 F (36.4 C) 98.2 F (36.8 C)  TempSrc: Oral  Oral Oral  SpO2: 99% 99% 99% 97%  Weight:      Height:       Abd: gravid, nontender Ext: no calf tenderness' FHT: Cat 1 strip TOCO: no contractions  Lab Results  Component Value Date   WBC 8.8 06/11/2020   HGB 12.0 06/11/2020   HCT 35.7 (L) 06/11/2020   MCV 89.3 06/11/2020   PLT 218 06/11/2020    --/--/B POS (03/08 0542)  A/p: 10-27-1987 35 y.o. 32 at [redacted]w[redacted]d HD#5 admitted with PPROM  1. PPROM without signs of infection or labor - Admission WBC 8.8 - Monitor for signs of infection with vitals, daily exams - Continue latency antibiotics: s/p, Azithro 1g and Ampicillin 2g q6h x 48 hours, continue po Amoxicillin 500mg  q8hrs x 5 days - Plan for delivery around 34 weeks or earlier if labor, infection, or other maternal or fetal indications arise 2. Preterm - S/p MFM consult - Betamethasone course (3/5-3/6), eligible for rescue course if anticipate delivery > 7 days from initial course and < 34 weeks. If imminent delivery and <32 weeks, magnesium for neuroprotection. - Growth [redacted]w[redacted]d 3/5: breech, 1788g (3lb15oz), AFI 10.4cm, normal appearing anterior placenta without sonographic signs of placenta accreta, cervix appears funneled with bulging membrane in the cervical canal noted. - s/p NICU consult  3. H/o PE/Factor V Leiden, previously on Lovenox 40mg  BID - Changed to heparin 10,000U BID given risk of delivery -s/p phone consultation 3/9 with Dr. [place], anesthesia.  He does not recommend protamine reversal should pt go into labor within  12 hours of last heparin injection.  If close to 12 hours since last dose and labor sxs develop, hold dose and check PTT. If short interval from heparin dose will recommend general anesthesia. This was discussed with patient. - SCDs 4. H/o endometrial ablation - Normal appearing placenta on 3/5 5/9 without sonographic signs of placenta accreta - Patient aware of risk of placenta accreta spectrum and hysterectomy at time of delivery - Admission Hgb 12.0 - Maintain active T & S 5. Mode of delivery: - Currently breech, patient aware if baby is breech when delivery is indicated, mode of delivery would be cesarean. If cesarean, patient desires permanent sterilization. Prior discussion with Dr. [name] re: the risks/benefits of tubal ligation vs. salpingectomy and wishes to have bilateral salpingectomy.  6. Fetal monitoring - NST daily with fetal heart rate check q shift - Weekly BPP   Dispo: remaining inpatient until delivery 5/5

## 2020-06-15 NOTE — Anesthesia Preprocedure Evaluation (Deleted)
Anesthesia Evaluation  Patient identified by MRN, date of birth, ID band Patient awake    Reviewed: Allergy & Precautions, NPO status , Patient's Chart, lab work & pertinent test results, reviewed documented beta blocker date and time   Airway Mallampati: II  TM Distance: >3 FB Neck ROM: Full    Dental no notable dental hx. (+) Teeth Intact   Pulmonary Current Smoker and Patient abstained from smoking., PE   Pulmonary exam normal breath sounds clear to auscultation       Cardiovascular negative cardio ROS Normal cardiovascular exam Rhythm:Regular Rate:Normal     Neuro/Psych PSYCHIATRIC DISORDERS Depression  Neuromuscular disease    GI/Hepatic negative GI ROS, Neg liver ROS,   Endo/Other  negative endocrine ROS  Renal/GU negative Renal ROS  negative genitourinary   Musculoskeletal  (+) Fibromyalgia -  Abdominal   Peds  Hematology negative hematology ROS (+) Lovenox transitioned to Heparin 10,000 u SQ Q12 h Factor V Leiden deficiency Hx/o PTE age 35   Anesthesia Other Findings   Reproductive/Obstetrics (+) Pregnancy PPROM 30 5/7 weeks Breech presentation                             Anesthesia Physical Anesthesia Plan  ASA: II  Anesthesia Plan:    Post-op Pain Management:    Induction:   PONV Risk Score and Plan:   Airway Management Planned:   Additional Equipment:   Intra-op Plan:   Post-operative Plan:   Informed Consent:   Plan Discussed with: Anesthesiologist and Surgeon  Anesthesia Plan Comments: (Discussed anesthesia with the patient, SAB vs GETA. If she requires emergency C/S vs going into labor spontaneously anesthetic would be determined by timing of last dose of SQ heparin and/or urgency of C/Section. Risks, benefits and alternatives of each discussed with patient. Also discussed with Dr. Claiborne Billings. M. [name], MD)        Anesthesia Quick Evaluation

## 2020-06-16 NOTE — Progress Notes (Signed)
35 y.o. [id number] [redacted]w[redacted]d HD#5 admitted for Preterm premature rupture of membranes [O42.919].  Pt currently stable with no c/o today.  Good FM.  Vitals:   06/15/20 1206 06/15/20 1539 06/15/20 1929 06/16/20 0430  BP: 128/88 121/85 126/85 114/70  Pulse: 86 90 96 85  Resp:  17 17 18   Temp:  98.5 F (36.9 C) 98.1 F (36.7 C) 97.8 F (36.6 C)  TempSrc:  Oral Oral Oral  SpO2:  99% 99% 99%  Weight:      Height:        Lungs CTA Cor RRR Abd  Soft, gravid, nontender Ex SCDs FHTs  140s, good short term variability, NST R Toco  occ  No results found for this or any previous visit (from the past 24 hour(s)).  A/p: Peggy Sloma [name] [id number] at [redacted]w[redacted]d HD#5admitted with PPROM  1. PPROM without signs of infection or labor - Admission WBC 8.8 - Monitor for signs of infection with vitals, daily exams - Continue latency antibiotics: s/p, Azithro 1g and Ampicillin 2g q6h x 48 hours, continue po Amoxicillin 500mg  q8hrs x 5 days - Plan for delivery around 34 weeks or earlier if labor, infection, or other maternal or fetal indications arise 2. Preterm - S/p MFM consult - Betamethasone course (3/5-3/6), eligible for rescue course if anticipate delivery > 7 days from initial course and < 34 weeks. If imminent delivery and <32 weeks, magnesium for neuroprotection. - Growth [redacted]w[redacted]d 3/5: breech, 1788g (3lb15oz), AFI 10.4cm, normal appearing anterior placenta without sonographic signs of placenta accreta, cervix appears funneled with bulging membrane in the cervical canal noted. - s/p NICU consult  3. H/o PE/Factor V Leiden, previously on Lovenox 40mg  BID - Changed to heparin 10,000U BID given risk of delivery -s/p phone consultation 3/9 with Dr. [place], anesthesia.  He does not recommend protamine reversal should pt go into labor within 12 hours of last heparin injection.  If close to 12 hours since last dose and labor sxs develop, hold dose and check PTT. If short interval from heparin dose will  recommend general anesthesia. This was discussed with patient. - SCDs 4. H/o endometrial ablation - Normal appearing placenta on 3/5 5/9 without sonographic signs of placenta accreta - Patient aware of risk of placenta accreta spectrum and hysterectomy at time of delivery - Admission Hgb 12.0 - Maintain active T & S 5. Mode of delivery: - Currently breech, patient aware if baby is breech when delivery is indicated, mode of delivery would be cesarean. If cesarean, patient desires permanent sterilization. Prior discussion with Dr. [name] re: the risks/benefits of tubal ligation vs. salpingectomy and wishes to have bilateral salpingectomy.  6. Fetal monitoring - NST daily with fetal heart rate check q shift - Weekly BPP   5/5

## 2020-06-17 ENCOUNTER — Inpatient Hospital Stay (HOSPITAL_BASED_OUTPATIENT_CLINIC_OR_DEPARTMENT_OTHER): Payer: Medicaid Other

## 2020-06-17 DIAGNOSIS — O321XX0 Maternal care for breech presentation, not applicable or unspecified: Secondary | ICD-10-CM | POA: Diagnosis not present

## 2020-06-17 DIAGNOSIS — Z3A31 31 weeks gestation of pregnancy: Secondary | ICD-10-CM

## 2020-06-17 DIAGNOSIS — O4290 Premature rupture of membranes, unspecified as to length of time between rupture and onset of labor, unspecified weeks of gestation: Secondary | ICD-10-CM | POA: Diagnosis not present

## 2020-06-17 LAB — TYPE AND SCREEN
ABO/RH(D): B POS
Antibody Screen: NEGATIVE

## 2020-06-17 NOTE — Progress Notes (Signed)
Pt to ultrasound

## 2020-06-17 NOTE — Plan of Care (Signed)
  Problem: Education: °Goal: Knowledge of General Education information will improve °Description: Including pain rating scale, medication(s)/side effects and non-pharmacologic comfort measures °Outcome: Completed/Met °  °Problem: Activity: °Goal: Risk for activity intolerance will decrease °Outcome: Completed/Met °  °Problem: Nutrition: °Goal: Adequate nutrition will be maintained °Outcome: Completed/Met °  °Problem: Coping: °Goal: Level of anxiety will decrease °Outcome: Completed/Met °  °Problem: Elimination: °Goal: Will not experience complications related to urinary retention °Outcome: Completed/Met °  °

## 2020-06-17 NOTE — Progress Notes (Signed)
35 y.o. [id number] [redacted]w[redacted]d, PPROM day #7.  Was in ultrasound during morning rounds--results still pending, but prelim read shows persistent breech presentation  Patient denies concerns.  No vaginal bleeding.  NO cramping, contractions, or abdominal pain.  No LE pain / edema.      Vitals:   06/16/20 2206 06/17/20 0422 06/17/20 0738 06/17/20 1200  BP: 114/78 123/76 111/69 127/76  Pulse: 92 77 78 96  Resp: 18 16 18 18   Temp: 97.8 F (36.6 C) 97.9 F (36.6 C) 97.6 F (36.4 C) 98.3 F (36.8 C)  TempSrc: Oral Oral Oral Oral  SpO2: 99% 100% 98% 98%  Weight:      Height:        NAD Abd  Soft, gravid, nontender Ext:  No LE edema or calf tenderness FHTs  Baseline 145, moderate variability, + accelerations, no decelerations, reactive NST Toco quiet  Results for orders placed or performed during the hospital encounter of 06/11/20 (from the past 24 hour(s))  Type and screen [facility]     Status: None   Collection Time: 06/17/20 11:25 AM  Result Value Ref Range   ABO/RH(D) B POS    Antibody Screen NEG    Sample Expiration      06/20/2020,2359 Performed at [facility], [address], [place], [place]     A/p: Kinjal Neitzke [name] [id number] at [redacted]w[redacted]d, PPROM day #7  1. PPROM without signs of infection or labor - Admission WBC 8.8 - Monitor for signs of infection with vitals, daily exams - Continue latency antibiotics: s/p, Azithro 1g and Ampicillin 2g q6h x 48 hours, continue po Amoxicillin 500mg  q8hrs x 5 days - Plan for delivery around 34 weeks or earlier if labor, infection, or other maternal or fetal indications arise 2. Preterm - S/p MFM consult - Betamethasone course (3/5-3/6), eligible for rescue course if anticipate delivery > 7 days from initial course and < 34 weeks. If imminent delivery and <32 weeks, magnesium for neuroprotection. - Growth [redacted]w[redacted]d 3/5: breech, 1788g (3lb15oz), AFI 10.4cm, normal appearing anterior placenta without sonographic  signs of placenta accreta, cervix appears funneled with bulging membrane in the cervical canal noted. - s/p NICU consult  3. H/o PE/Factor V Leiden, previously on Lovenox 40mg  BID - Changed to heparin 10,000U BID given risk of delivery -s/p phone consultation 3/9 with Dr. [place], anesthesia.  He does not recommend protamine reversal should pt go into labor within 12 hours of last heparin injection.  If close to 12 hours since last dose and labor sxs develop, hold dose and check PTT. If short interval from heparin dose will recommend general anesthesia. This was discussed with patient. - SCDs 4. H/o endometrial ablation - Normal appearing placenta on 3/5 5/9 without sonographic signs of placenta accreta - Patient aware of risk of placenta accreta spectrum and hysterectomy at time of delivery - Admission Hgb 12.0 - Maintain active T & S 5. Mode of delivery: - Currently breech, patient aware if baby is breech when delivery is indicated, mode of delivery would be cesarean. If cesarean, patient desires permanent sterilization. Prior discussion with Dr. [name] re: the risks/benefits of tubal ligation vs. salpingectomy and wishes to have bilateral salpingectomy.  6. Fetal monitoring - NST daily with fetal heart rate check q shift - Weekly BPP--prelim result is 8/10 (-2 for oligohydramnios).  Awaiting final result   Evora Schechter GEFFEL 5/5

## 2020-06-18 MED ORDER — ACETAMINOPHEN 500 MG PO TABS
1000.0000 mg | ORAL_TABLET | Freq: Once | ORAL | Status: AC
  Administered 2020-06-18: 1000 mg via ORAL
  Filled 2020-06-18: qty 2

## 2020-06-18 NOTE — Progress Notes (Signed)
AM NST  Toco quiet NST: baseline 140s, moderate variability, + accelerations.  NO fetal tachycardia present

## 2020-06-18 NOTE — Progress Notes (Signed)
35 y.o. [id number] [redacted]w[redacted]d, PPROM day #8.  Was in ultrasound during morning rounds--results still pending, but prelim read shows persistent breech presentation  Patient denies vaginal bleeding.  No LE pain / edema. She reports a pain on bilateral flanks, present when sitting of lying.  Does NOT feel that this is contractions (states "i've had three babies and this is not labor pain").  Denies dysuria, constipation.      Vitals:   06/17/20 1945 06/17/20 2235 06/18/20 0549 06/18/20 0828  BP:   112/77 118/88  Pulse: (!) 107 (!) 103 78 89  Resp: 18 18 18 16   Temp: 98.2 F (36.8 C) 98 F (36.7 C) 98.7 F (37.1 C) (!) 97.5 F (36.4 C)  TempSrc: Oral Oral Oral Oral  SpO2: 100% 100% 98% 98%  Weight:      Height:        NAD Abd  Soft, gravid, no fundal tenderness.  Mild tenderness to lateral aspects of abdomen (not fundus / uterus) Ext:  No LE edema or calf tenderness FHTs  Pending for today   Results for orders placed or performed during the hospital encounter of 06/11/20 (from the past 24 hour(s))  Type and screen [facility]     Status: None   Collection Time: 06/17/20 11:25 AM  Result Value Ref Range   ABO/RH(D) B POS    Antibody Screen NEG    Sample Expiration      06/20/2020,2359 Performed at [facility], [address] [place], [place]     A/p: Peggy [name] [id number] at [redacted]w[redacted]d, PPROM day #1  1. PPROM without signs of infection or labor - Admission WBC 8.8 - Monitor for signs of infection with vitals, daily exams.  ?? Abdominal vs flank pain today.  Remains afebrile, pulse normal.  Mildly tachycardic overnight in 100s (but has intermittently had HR low 100s since admission).  Will place on NST now for at least one hour and increase frequency of monitoring today to TID - s/p latency antibiotics - Plan for delivery around 34 weeks or earlier if labor, infection, or other maternal or fetal indications arise 2. Preterm - S/p MFM  consult - Betamethasone course (3/5-3/6), eligible for rescue course if anticipate delivery > 7 days from initial course and < 34 weeks. If imminent delivery and <32 weeks, magnesium for neuroprotection. - Growth [redacted]w[redacted]d 3/5: breech, 1788g (3lb15oz), AFI 10.4cm, normal appearing anterior placenta without sonographic signs of placenta accreta, cervix appears funneled with bulging membrane in the cervical canal noted. - s/p NICU consult  3. H/o PE/Factor V Leiden, previously on Lovenox 40mg  BID - Changed to heparin 10,000U BID given risk of delivery -s/p phone consultation 3/9 with Dr. , anesthesia.  He does not recommend protamine reversal should pt go into labor within 12 hours of last heparin injection.  If close to 12 hours since last dose and labor sxs develop, hold dose and check PTT. If short interval from heparin dose will recommend general anesthesia. This was discussed with patient. - SCDs 4. H/o endometrial ablation - Normal appearing placenta on 3/5 [name] without sonographic signs of placenta accreta - Patient aware of risk of placenta accreta spectrum and hysterectomy at time of delivery - Admission Hgb 12.0 - Maintain active T & S 5. Mode of delivery: - Currently breech, patient aware if baby is breech when delivery is indicated, mode of delivery would be cesarean. If cesarean, patient desires permanent sterilization. Prior discussion with Dr. 5/5  re: the risks/benefits of tubal ligation vs. salpingectomy and wishes to have bilateral salpingectomy.  6. Fetal monitoring - NST daily with fetal heart rate check q shift - Weekly BPP pm 3/11--prelim result is 8/10 (-2 for oligohydramnios).  Awaiting final result still as of 3/12  [facility] GEFFEL [name]

## 2020-06-19 LAB — GLUCOSE, CAPILLARY: Glucose-Capillary: 134 mg/dL — ABNORMAL HIGH (ref 70–99)

## 2020-06-19 MED ORDER — DEXAMETHASONE SODIUM PHOSPHATE 10 MG/ML IJ SOLN
10.0000 mg | Freq: Once | INTRAMUSCULAR | Status: AC
  Administered 2020-06-19: 10 mg via INTRAVENOUS
  Filled 2020-06-19: qty 1

## 2020-06-19 MED ORDER — DIPHENHYDRAMINE HCL 50 MG/ML IJ SOLN
25.0000 mg | Freq: Once | INTRAMUSCULAR | Status: AC
  Administered 2020-06-19: 25 mg via INTRAVENOUS
  Filled 2020-06-19: qty 1

## 2020-06-19 MED ORDER — PROCHLORPERAZINE EDISYLATE 10 MG/2ML IJ SOLN
10.0000 mg | Freq: Once | INTRAMUSCULAR | Status: AC
  Administered 2020-06-19: 10 mg via INTRAVENOUS
  Filled 2020-06-19: qty 2

## 2020-06-19 MED ORDER — OXYCODONE HCL 5 MG PO TABS
5.0000 mg | ORAL_TABLET | Freq: Once | ORAL | Status: AC
  Administered 2020-06-19: 5 mg via ORAL
  Filled 2020-06-19: qty 1

## 2020-06-19 NOTE — Progress Notes (Signed)
Called by RN.  Patient reports headache unrelieved by tylenol 20-25 minutes ago.  Requesting alternative medication.  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  She reports allergy test was positive for caffeine.  However, she drinks caffeine regularly now (coffee / soda / etc). With regular caffeine consumption, this is unlikely a true allergy, but will utilize a dose of oxycodone instead

## 2020-06-19 NOTE — Progress Notes (Signed)
35 y.o. [id number] [redacted]w[redacted]d, PPROM day #9.   Overnight continued to complain of low back pain--has been persistent most of pregnancy on right side, now bilateral low back.  She reports upper abdominal pain--states she feels that the baby "has no more room."  Her pain is improved when she sits in the chair as opposed to the bed.    She denies vaginal bleeding, cramping, contractions.  She continues to have a small amount of mucus discharge, no change over last few days.  No LE pain / edema.  She has a mild headache this AM.  Specifically, she feels that this pain is NOT labor or uterine pain   Vitals:   06/18/20 2002 06/18/20 2130 06/19/20 0622 06/19/20 0752  BP: 116/79 119/85 115/78 124/87  Pulse: 93 (!) 101 82 75  Resp: 18 19 20 20   Temp: 98.2 F (36.8 C) 97.8 F (36.6 C) 98.1 F (36.7 C) 97.9 F (36.6 C)  TempSrc: Oral Axillary Oral Oral  SpO2: 99% 97% 99% 98%  Weight:      Height:        NAD Abd  Soft, gravid, no fundal tenderness, no uterine tenderness even with deep palpation.  Mild tenderness to upper abdomen / flanks Ext:  No LE edema or calf tenderness  Toco:  quiet FHTs: 130s, moderate variability, + accelerations, no decelerations   Results for orders placed or performed during the hospital encounter of 06/11/20 (from the past 24 hour(s))  Glucose, capillary     Status: Abnormal   Collection Time: 06/19/20 10:29 AM  Result Value Ref Range   Glucose-Capillary 134 (H) 70 - 99 mg/dL    A/p: Peggy [name] [id number] at [redacted]w[redacted]d, PPROM day #9  1. PPROM without signs of infection or labor - s/p latency antibiotics - Long discussion with patient today re: current symptoms.  Discussed that preterm labor and infection are 2 of the common reasons to deliver in the setting of PPROM and we need to have a high suspicion for these conditions.  However, I have no objective evidence of infection or labor at this time.  She has remained afebrile.  There is no fetal tachycardia on  monitoring.  Specifically on exam, she has no uterine or fundal tenderness. She has had no vaginal bleeding or increase in discharge.  Pain is upper abdomen / lateral and positional.  Suspect MSK etiology, but will continue to watch closely.  - Plan for delivery around 34 weeks or earlier if labor, infection, or other maternal or fetal indications arise 2. Preterm - S/p MFM consult - Betamethasone course (3/5-3/6), eligible for rescue course if anticipate delivery > 7 days from initial course and < 34 weeks. If imminent delivery and <32 weeks, magnesium for neuroprotection. - Growth [redacted]w[redacted]d 3/5: breech, 1788g (3lb15oz), AFI 10.4cm, normal appearing anterior placenta without sonographic signs of placenta accreta, cervix appears funneled with bulging membrane in the cervical canal noted. - s/p NICU consult  3. H/o PE/Factor V Leiden, previously on Lovenox 40mg  BID - Changed to heparin 10,000U BID given risk of delivery -s/p phone consultation 3/9 with Dr. , anesthesia.  He does not recommend protamine reversal should pt go into labor within 12 hours of last heparin injection.  If close to 12 hours since last dose and labor sxs develop, hold dose and check PTT. If short interval from heparin dose will recommend general anesthesia. This was discussed with patient. - SCDs 4. H/o endometrial ablation - Normal appearing placenta on 3/5 Peggy [name]  without sonographic signs of placenta accreta - Patient aware of risk of placenta accreta spectrum and hysterectomy at time of delivery - Admission Hgb 12.0 - Maintain active T & S 5. Mode of delivery: - Currently breech, patient aware if baby is breech when delivery is indicated, mode of delivery would be cesarean. If cesarean, patient desires permanent sterilization. Prior discussion with Dr. Tenny Craw re: the risks/benefits of tubal ligation vs. salpingectomy and wishes to have bilateral salpingectomy.  6. Fetal monitoring - NST daily with fetal heart rate check q  shift - Weekly BPP pm 3/11--result is 8/10 (-2 for oligohydramnios).   [facility] GEFFEL [employer]

## 2020-06-20 LAB — TYPE AND SCREEN
ABO/RH(D): B POS
Antibody Screen: NEGATIVE

## 2020-06-20 NOTE — Progress Notes (Addendum)
35 y.o. [id number] [redacted]w[redacted]d, PPROM day #10.   S: Feeling much better this morning. Headache resolved after headache cocktail. Active FM, continued LOF. Low back discomfort persists. Deniesa    Vitals:   06/19/20 1639 06/19/20 1927 06/20/20 0603 06/20/20 0851  BP:  118/86 120/78 137/83  Pulse:  92 76 97  Resp:  18 18 18   Temp: 97.8 F (36.6 C) 98.1 F (36.7 C) 98.5 F (36.9 C) 98.6 F (37 C)  TempSrc: Oral Oral Oral Oral  SpO2:  99% 99% 100%  Weight:      Height:        NAD Abd  Soft, gravid, no fundal tenderness Ext:  No LE edema or calf tenderness  Toco:  quiet FHTs: 140s, moderate variability, + accelerations, no decelerations, reactive NST, cat 1 tracing   No results found for this or any previous visit (from the past 24 hour(s)).  A/p: Zarie Kosiba [name] [id number] at [redacted]w[redacted]d, PPROM day #10  1. PPROM without signs of infection or labor - s/p latency antibiotics - No evidence of chorio at this time - Plan for delivery around 34 weeks or earlier if labor, infection, or other maternal or fetal indications arise 2. Preterm - S/p MFM consult - Betamethasone course (3/5-3/6), eligible for rescue course if anticipate delivery > 7 days from initial course and < 34 weeks. If imminent delivery and <32 weeks, magnesium for neuroprotection. - Growth [redacted]w[redacted]d 3/5: breech, 1788g (3lb15oz), AFI 10.4cm, normal appearing anterior placenta without sonographic signs of placenta accreta, cervix appears funneled with bulging membrane in the cervical canal noted. - s/p NICU consult  3. H/o PE/Factor V Leiden, previously on Lovenox 40mg  BID - Changed to heparin 10,000U BID given risk of delivery -s/p phone consultation 3/9 with Dr. , anesthesia.  He does not recommend protamine reversal should pt go into labor within 12 hours of last heparin injection.  If close to 12 hours since last dose and labor sxs develop, hold dose and check PTT. If short interval from heparin dose will recommend general  anesthesia. This was discussed with patient. - SCDs 4. H/o endometrial ablation - Normal appearing placenta on 3/5 [name] without sonographic signs of placenta accreta - Patient aware of risk of placenta accreta spectrum and hysterectomy at time of delivery - Admission Hgb 12.0 - Maintain active T & S 5. Mode of delivery: - Currently breech, patient aware if baby is breech when delivery is indicated, mode of delivery would be cesarean. If cesarean, patient desires permanent sterilization. Prior discussion with Dr. 5/5 re: the risks/benefits of tubal ligation vs. salpingectomy and wishes to have bilateral salpingectomy.  6. Fetal monitoring - NST daily with fetal heart rate check q shift - Weekly BPP pm 3/11--result is 8/10 (-2 for oligohydramnios).  7. Low back discomfort: Suspect MSK. Pt generally fairly active at home. Hasn't been this sedentary in some time. WIll have PT evaluate for some exercises/stretching given anticipated prolonged hospitalization  [place]

## 2020-06-21 NOTE — Progress Notes (Signed)
35 y.o. [id number] [redacted]w[redacted]d HD#10 admitted for Preterm premature rupture of membranes [O42.919].  Pt currently stable with no c/o today except for back pain.  Good FM.  Vitals:   06/20/20 1138 06/20/20 1545 06/20/20 1952 06/21/20 0627  BP: 133/86 (!) 142/79 129/83 116/81  Pulse: 100 (!) 111 (!) 108 76  Resp: 18 18 18 16   Temp: 98.5 F (36.9 C) 98.2 F (36.8 C) 98.1 F (36.7 C) 98 F (36.7 C)  TempSrc: Oral Oral Oral Oral  SpO2: 100% 99% 100% 100%  Weight:      Height:        Lungs CTA Cor RRR Abd  Soft, gravid, nontender Ex SCDs FHTs  Last night 130s, good short term variability, NST R Toco  rare  Results for orders placed or performed during the hospital encounter of 06/11/20 (from the past 24 hour(s))  Type and screen [facility]     Status: None   Collection Time: 06/20/20 11:58 AM  Result Value Ref Range   ABO/RH(D) B POS    Antibody Screen NEG    Sample Expiration      06/23/2020,2359 Performed at [facility], [address] [place], [place]    3-11 BPP Breech 6/8; AFI 3.8  A/p: Khiya Friese [name] [id number] at [redacted]w[redacted]d, PPROM day #10  1. PPROM without signs of infection or labor - s/p latency antibiotics - No evidence of chorio at this time - Plan for delivery around 34 weeks or earlier if labor, infection, or other maternal or fetal indications arise 2. Preterm - S/p MFM consult - Betamethasone course (3/5-3/6), eligible for rescue course if anticipate delivery > 7 days from initial course and < 34 weeks. If imminent delivery and <32 weeks, magnesium for neuroprotection. - Growth [redacted]w[redacted]d 3/5: breech, 1788g (3lb15oz), AFI 10.4cm, normal appearing anterior placenta without sonographic signs of placenta accreta, cervix appears funneled with bulging membrane in the cervical canal noted. - s/p NICU consult  3. H/o PE/Factor V Leiden, previously on Lovenox 40mg  BID - Changed to heparin 10,000U BID given risk of delivery -s/p phone  consultation 3/9 with Dr. , anesthesia. He does not recommend protamine reversal should pt go into labor within 12 hours of last heparin injection. If close to 12 hours since last dose and labor sxs develop, hold dose and check PTT. If short interval from heparin dose will recommend general anesthesia. This was discussed with patient. - SCDs 4. H/o endometrial ablation - Normal appearing placenta on 3/5 [name] without sonographic signs of placenta accreta - Patient aware of risk of placenta accreta spectrum and hysterectomy at time of delivery - Admission Hgb 12.0 - Maintain active T & S 5. Mode of delivery: - Still currently breech, patient aware if baby is breech when delivery is indicated, mode of delivery would be cesarean. If cesarean, patient desires permanent sterilization.Priordiscussion with Dr. 5/5 re:the risks/benefits of tubal ligation vs. salpingectomy and wishes to have bilateral salpingectomy.  6. Fetal monitoring - NST daily with fetal heart rate check q shift - Weekly BPP pm 3/11--result is 8/10 (-2 for oligohydramnios).  7. Low back discomfort: Suspect MSK. Pt generally fairly active at home. Hasn't been this sedentary in some time. WIll have PT evaluate for some exercises/stretching given anticipated prolonged hospitalization  [place]

## 2020-06-21 NOTE — Progress Notes (Signed)
Initial Nutrition Assessment  DOCUMENTATION CODES:  Not applicable  INTERVENTION:  Regulat Diet Pt may order double protein portions and snacks TID if she makes request when ordering meals   NUTRITION DIAGNOSIS:  Increased nutrient needs related to  (pregnancy and fetal growth requirements) as evidenced by  (31 weeks IUP).  GOAL:  Patient will meet greater than or equal to 90% of their needs MONITOR:  Weight trends  REASON FOR ASSESSMENT:  Antenatal,LOS   ASSESSMENT:  31 4/7 weeks, adm w/ PROM. Est pre-pregancy weight 83 kg, BMI 25.5. 21 lb weight gain to date. PNV w/ iron.  s/p BMZ. Del at 34 weeks  Diet Order:   Diet Order            Diet regular Room service appropriate? Yes; Fluid consistency: Thin  Diet effective now                EDUCATION NEEDS:   No education needs have been identified at this time  Skin:  Skin Assessment: Reviewed RN Assessment Height:   Ht Readings from Last 1 Encounters:  06/11/20 5\' 11"  (1.803 m)   Weight:   Wt Readings from Last 1 Encounters:  06/11/20 92.8 kg   Ideal Body Weight:    155 lbs  BMI:  Body mass index is 28.53 kg/m.  Estimated Nutritional Needs:   Kcal:  2200-2400  Protein:  98-108 g  Fluid:  2.4 L

## 2020-06-21 NOTE — Evaluation (Signed)
Physical Therapy Evaluation Patient Details Name: Peggy [name] MRN: [id number] DOB: Mar 23, 1986 Today's Date: 06/21/2020   History of Present Illness  Pt adm 3/5 with premature rupture of membranes at 30 wks 1 day gestation. Pt developed low back pain with limited activity in hospital. PMH - PE, Factor V Leiden  Clinical Impression  Pt presents to PT with low back pain since she has had very limited activity since PROM in 30th week of pregnancy. Instructed pt in some stretching exercises for low back. Instructed pt to stop stretches if any pain, incr contractions, or incr in leakage of amniotic fluid. Pt verbalized understanding. Will follow up with pt later in the week to see how she is doing.     Follow Up Recommendations No PT follow up    Equipment Recommendations  None recommended by PT    Recommendations for Other Services       Precautions / Restrictions        Mobility  Bed Mobility Overal bed mobility: Independent                  Transfers Overall transfer level: Independent Equipment used: None                Ambulation/Gait Ambulation/Gait assistance: Independent              Information systems manager [employer] (Stroke Patients Only)       Balance Overall balance assessment: No apparent balance deficits (not formally assessed)                                           Pertinent Vitals/Pain Pain Assessment: 0-10 Pain Score: 3  Pain Location: low back Pain Descriptors / Indicators: Tightness Pain Intervention(s): Repositioned;Other (comment) (stretches)    Home Living Family/patient expects to be discharged to:: Private residence Living Arrangements: Spouse/significant other;Children                    Prior Function Level of Independence: Independent               Hand Dominance        Extremity/Trunk Assessment   Upper Extremity Assessment Upper Extremity  Assessment: Overall WFL for tasks assessed    Lower Extremity Assessment Lower Extremity Assessment: Overall WFL for tasks assessed       Communication   Communication: No difficulties  Cognition Arousal/Alertness: Awake/alert Behavior During Therapy: WFL for tasks assessed/performed Overall Cognitive Status: Within Functional Limits for tasks assessed                                        General Comments      Exercises Antenatal Exercises Sidelying Hip Flexor Stretch: PROM;Both;5 reps Other Exercises Other Exercises: Hook lying trunk rotation x 4 Other Exercises: Single knee to stomach x 4 Other Exercises: Standing back extension x 4   Assessment/Plan    PT Assessment Patient needs continued PT services  PT Problem List Pain       PT Treatment Interventions Therapeutic exercise;Patient/family education    PT Goals (Current goals can be found in the Care Plan section)  Acute Rehab PT Goals PT Goal Formulation: With patient Time For  Goal Achievement: 06/28/20 Potential to Achieve Goals: Good    Frequency Min 1X/week   Barriers to discharge        Co-evaluation               AM-PAC PT "6 Clicks" Mobility  Outcome Measure Help needed turning from your back to your side while in a flat bed without using bedrails?: None Help needed moving from lying on your back to sitting on the side of a flat bed without using bedrails?: None Help needed moving to and from a bed to a chair (including a wheelchair)?: None Help needed standing up from a chair using your arms (e.g., wheelchair or bedside chair)?: None Help needed to walk in hospital room?: None Help needed climbing 3-5 steps with a railing? : None 6 Click Score: 24    End of Session   Activity Tolerance: Patient tolerated treatment well Patient left: in bed;with call bell/phone within reach;with family/visitor present   PT Visit Diagnosis: Pain Pain - part of body:  (low back)     Time: 1555-1610 PT Time Calculation (min) (ACUTE ONLY): 15 min   Charges:   PT Evaluation $PT Eval Low Complexity: 1 Low          [facility] PT Acute Rehabilitation Services Pager [phone] Office [phone]   [name] [facility] 06/21/2020, 4:52 PM

## 2020-06-22 NOTE — Plan of Care (Signed)
  Problem: Pain Managment: Goal: General experience of comfort will improve Outcome: Completed/Met   Problem: Safety: Goal: Ability to remain free from injury will improve Outcome: Completed/Met

## 2020-06-22 NOTE — Progress Notes (Signed)
35 y.o. [id number] [redacted]w[redacted]d HD#11 admitted for Preterm premature rupture of membranes [O42.919].  Pt currently stable with no c/o today. Continues to have some back pain, seen by PT yesterday. Good FM.  Vitals:   06/21/20 1618 06/21/20 1914 06/22/20 0416 06/22/20 0722  BP: 124/86 123/85 118/83 119/84  Pulse: 97 96 77 87  Resp: 18 18 18 20   Temp: 97.9 F (36.6 C) 98.3 F (36.8 C) 97.9 F (36.6 C) 97.8 F (36.6 C)  TempSrc: Oral Oral Oral Oral  SpO2: 100% 100% 100% 99%  Weight:      Height:       Abd  Soft, gravid, nontender Ex No evidence of DVT NST this AM FHR 140, +moderate variability, no decels. +accels. Reactive NST toco quiet  A/p: Noma Quijas [name] [id number] at [redacted]w[redacted]d, PPROM day #11  1. PPROM without signs of infection or labor - s/p latency antibiotics - No evidence of chorio at this time - Plan for delivery around 34 weeks or earlier if labor, infection, or other maternal or fetal indications arise 2. Preterm - S/p MFM consult - Betamethasone course (3/5-3/6), eligible for rescue course if anticipate delivery > 7 days from initial course and < 34 weeks. If imminent delivery and <32 weeks, magnesium for neuroprotection. - Growth [redacted]w[redacted]d 3/5: breech, 1788g (3lb15oz), AFI 10.4cm, normal appearing anterior placenta without sonographic signs of placenta accreta, cervix appears funneled with bulging membrane in the cervical canal noted. - s/p NICU consult  3. H/o PE/Factor V Leiden, previously on Lovenox 40mg  BID - Changed to heparin 10,000U BID given risk of delivery -s/p phone consultation 3/9 with Dr. , anesthesia. He does not recommend protamine reversal should pt go into labor within 12 hours of last heparin injection. If close to 12 hours since last dose and labor sxs develop, hold dose and check PTT. If short interval from heparin dose will recommend general anesthesia. This was discussed with patient. - SCDs 4. H/o endometrial ablation - Normal appearing placenta  on 3/5 [name] without sonographic signs of placenta accreta - Patient aware of risk of placenta accreta spectrum and hysterectomy at time of delivery - Admission Hgb 12.0 - Maintain active T & S 5. Mode of delivery: - Still currently breech, patient aware if baby is breech when delivery is indicated, mode of delivery would be cesarean. If cesarean, patient desires permanent sterilization.Priordiscussion with Dr. 5/5 re:the risks/benefits of tubal ligation vs. salpingectomy and wishes to have bilateral salpingectomy.  6. Fetal monitoring - NST daily with fetal heart rate check q shift - Weekly BPP (fridays)  7. Low back discomfort: Suspect MSK. Pt generally fairly active at home. Hasn't been this sedentary in some time. Seen by PT  [place]

## 2020-06-23 LAB — TYPE AND SCREEN
ABO/RH(D): B POS
Antibody Screen: NEGATIVE

## 2020-06-23 MED ORDER — LIDOCAINE 5 % EX PTCH
2.0000 | MEDICATED_PATCH | CUTANEOUS | Status: DC
  Administered 2020-06-24: 1 via TRANSDERMAL
  Administered 2020-06-25: 2 via TRANSDERMAL
  Filled 2020-06-23 (×5): qty 2

## 2020-06-23 NOTE — Progress Notes (Signed)
PT Note/Discharge   Pt seen for follow-up for exercise program for low back pain. Pt reports understanding of ex's and has been performing them independently. Reports temporary relief after ex's but continues to have pain with limited activity. Recommended to pt that she try using 1-2 tennis balls under her back in supine to provide some self massage to see if she get any relief from that. Pt to have someone bring her tennis balls to try.  Also wonder if pt could use a pain patch on low back (unsure of limitations with pregnancy for these). Will sign off for PT.   Georga Hacking [facility] PT Acute Rehabilitation Services Pager [phone] Office [phone]

## 2020-06-23 NOTE — Progress Notes (Signed)
35 y.o. [id number] [redacted]w[redacted]d HD#12 admitted for Preterm premature rupture of membranes [O42.919].  Pt currently stable with no c/o today. Continues to have some back pain, seen by PT yesterday. Good FM.  Vitals:   06/23/20 0530 06/23/20 0810 06/23/20 1139 06/23/20 1523  BP: 116/81 (!) 124/91 (!) 138/96 122/86  Pulse: 84 81 91 98  Resp: 18 18 17 18   Temp: 98 F (36.7 C) (!) 97.5 F (36.4 C) 98.3 F (36.8 C) (!) 97.4 F (36.3 C)  TempSrc: Oral Axillary Oral Axillary  SpO2: 99% 99% 99% 100%  Weight:      Height:       Abd  Soft, gravid, nontender Ex No evidence of DVT NST this AM FHR 140, +moderate variability, no decels. +accels. Reactive NST toco quiet  A/p: Peggy [name] at [redacted]w[redacted]d, PPROM day #12  1. PPROM without signs of infection or labor - s/p latency antibiotics - No evidence of chorio at this time - Plan for delivery around 34 weeks or earlier if labor, infection, or other maternal or fetal indications arise 2. Preterm - S/p MFM consult - Betamethasone course (3/5-3/6), eligible for rescue course if anticipate delivery > 7 days from initial course and < 34 weeks. If imminent delivery and <32 weeks, magnesium for neuroprotection. - Growth [redacted]w[redacted]d 3/5: breech, 1788g (3lb15oz), AFI 10.4cm, normal appearing anterior placenta without sonographic signs of placenta accreta, cervix appears funneled with bulging membrane in the cervical canal noted. - s/p NICU consult  3. H/o PE/Factor V Leiden, previously on Lovenox 40mg  BID - Changed to heparin 10,000U BID given risk of delivery -s/p phone consultation 3/9 with Dr. , anesthesia. He does not recommend protamine reversal should pt go into labor within 12 hours of last heparin injection. If close to 12 hours since last dose and labor sxs develop, hold dose and check PTT. If short interval from heparin dose will recommend general anesthesia. This was discussed with patient. - SCDs 4. H/o endometrial ablation - Normal  appearing placenta on 3/5 [name] without sonographic signs of placenta accreta - Patient aware of risk of placenta accreta spectrum and hysterectomy at time of delivery - Admission Hgb 12.0 - Maintain active T & S 5. Mode of delivery: - Still currently breech, patient aware if baby is breech when delivery is indicated, mode of delivery would be cesarean. If cesarean, patient desires permanent sterilization.Priordiscussion with Dr. 5/5 re:the risks/benefits of tubal ligation vs. salpingectomy and wishes to have bilateral salpingectomy.  6. Fetal monitoring - NST daily with fetal heart rate check q shift - Weekly BPP (fridays)  7. Low back discomfort: Suspect MSK. Pt generally fairly active at home. Hasn't been this sedentary in some time. Seen by PT, exercises provided. Trial of lidoderm patch to low back  [place]

## 2020-06-24 ENCOUNTER — Inpatient Hospital Stay (HOSPITAL_BASED_OUTPATIENT_CLINIC_OR_DEPARTMENT_OTHER): Payer: Medicaid Other

## 2020-06-24 DIAGNOSIS — Z3A32 32 weeks gestation of pregnancy: Secondary | ICD-10-CM

## 2020-06-24 DIAGNOSIS — O42913 Preterm premature rupture of membranes, unspecified as to length of time between rupture and onset of labor, third trimester: Secondary | ICD-10-CM | POA: Diagnosis not present

## 2020-06-24 NOTE — Progress Notes (Signed)
35 y.o. [id number] [redacted]w[redacted]d HD#13 admitted for Preterm premature rupture of membranes. Pt currently stable with no c/o today. Requesting ok for walking around outside. Good FM.  No bleeding, no abdominal tenderness, no fever.  No other complaints.  Vitals:   06/23/20 2229 06/24/20 0530 06/24/20 0750 06/24/20 1114  BP: 133/81 109/78 128/83 123/81  Pulse: (!) 116 (!) 101 98 (!) 104  Resp: 18 18 18 18   Temp: 97.9 F (36.6 C) 98.2 F (36.8 C) 97.8 F (36.6 C) 97.8 F (36.6 C)  TempSrc: Oral Oral Oral Axillary  SpO2: 99% 100% 98% 99%  Weight:      Height:       Abd  Soft, gravid, nontender Ex No evidence of DVT NST this AM FHR 144, +moderate variability, no decels. +accels. Reactive NST toco quiet  Odelle Kosier [name] [id number] at [redacted]w[redacted]d, PPROM day #13  1. PPROM without signs of infection or labor - s/p latency antibiotics -No evidence of chorio at this time - Plan for delivery around 34 weeks or earlier if labor, infection, or other maternal or fetal indications arise 2. Preterm - S/p MFM consult - Betamethasone course (3/5-3/6), eligible for rescue course if anticipate delivery > 7 days from initial course and < 34 weeks.  - Growth [redacted]w[redacted]d 3/5: breech, 1788g (3lb15oz), AFI 10.4cm, normal appearing anterior placenta without sonographic signs of placenta accreta, cervix appears funneled with bulging membrane in the cervical canal noted. - s/p NICU consult  3. H/o PE/Factor V Leiden, previously on Lovenox 40mg  BID - Changed to heparin 10,000U BID given risk of delivery -s/p phone consultation 3/9 with Dr. , anesthesia. He does not recommend protamine reversal should pt go into labor within 12 hours of last heparin injection. If close to 12 hours since last dose and labor sxs develop, hold dose and check PTT. If short interval from heparin dose will recommend general anesthesia. This was discussed with patient. - SCDs 4. H/o endometrial ablation - Normal appearing placenta on 3/5 [name]  without sonographic signs of placenta accreta - Patient aware of risk of placenta accreta spectrum and hysterectomy at time of delivery - Admission Hgb 12.0 - Maintain active T & S 5. Mode of delivery: - Still currently breech, patient aware if baby is breech when delivery is indicated, mode of delivery would be cesarean. If cesarean, patient desires permanent sterilization.Priordiscussion with Dr. 5/5 re:the risks/benefits of tubal ligation vs. salpingectomy and wishes to have bilateral salpingectomy.  6. Fetal monitoring - NST daily with fetal heart rate check q shift - Weekly BPP (fridays) , today 6/8 -2 for fluid, NST reactive: BPP 8/10 7. Low back discomfort: Suspect MSK. Pt generally fairly active at home. Hasn't been this sedentary in some time. Seen by PT, exercises provided. Trial of lidoderm patch to low back  Peggy [name]

## 2020-06-25 LAB — TYPE AND SCREEN: ABO/RH(D): B POS

## 2020-06-25 NOTE — Progress Notes (Signed)
RN called to report patient mentioned some mild crampyness.  She stated they didn't feel like contractions.  RN readjusted TOCO and no contractions noted before or after adjustment.  On further discussion with RN, she stated she told pt she had called me to tell me and patient essentially said it hadn't really warranted a call to me.  I advised RN to collect UA to r/o UTI and recheck temp.  If no worsening tenderness and temp<99 ok to administer 10pm dose of heparin.  Will consider repeat course of steroids if cramping worsens, abd tenderness worsens, temp <99   or signs of contraction on TOCO.  Currently RN reports patient looking and acting well.

## 2020-06-25 NOTE — Progress Notes (Signed)
58 y.T.[id number] [redacted]w[redacted]d HD#14admitted for Preterm premature rupture of membranes. Pt currently stable with no complaints today.  Good FM.  No bleeding, no abdominal tenderness, no fever.  No other complaints.   Vitals:   06/24/20 1517 06/24/20 2009 06/25/20 0554 06/25/20 0759  BP: (!) 123/92 126/89 123/83 129/88  Pulse: 93 (!) 105 79 93  Resp: 18 18 18 18   Temp: 97.8 F (36.6 C) 97.6 F (36.4 C) 98.2 F (36.8 C)   TempSrc:  Axillary Oral   SpO2: 99% 100% 99% 99%  Weight:      Height:       Peggy [name] [name] [id number] at [redacted]w[redacted]d, PPROM day #14  1. PPROM without signs of infection or labor - s/p latency antibiotics -No evidence of chorio at this time - Plan for delivery around 34 weeks or earlier if labor, infection, or other maternal or fetal indications arise 2. Preterm - S/p MFM consult - Betamethasone course (3/5-3/6), eligible for rescue course if anticipate delivery > 7 days from initial course and < 34 weeks.  - Growth [redacted]w[redacted]d 3/5: breech, 1788g (3lb15oz), AFI 10.4cm, normal appearing anterior placenta without sonographic signs of placenta accreta, cervix appears funneled with bulging membrane in the cervical canal noted. - s/p NICU consult  3. H/o PE/Factor V Leiden, previously on Lovenox 40mg  BID - Changed to heparin 10,000U BID given risk of delivery -s/p phone consultation 3/9 with Dr. , anesthesia. He does not recommend protamine reversal should pt go into labor within 12 hours of last heparin injection. If close to 12 hours since last dose and labor sxs develop, hold dose and check PTT. If short interval from heparin dose will recommend general anesthesia. This was discussed with patient. - SCDs 4. H/o endometrial ablation - Normal appearing placenta on 3/5 [name] without sonographic signs of placenta accreta - Patient aware of risk of placenta accreta spectrum and hysterectomy at time of delivery - Admission Hgb 12.0 - Maintain active T & S 5. Mode of delivery: -  Still currently breech, patient aware if baby is breech when delivery is indicated, mode of delivery would be cesarean. If cesarean, patient desires permanent sterilization.Priordiscussion with Dr. 5/5 re:the risks/benefits of tubal ligation vs. salpingectomy and wishes to have bilateral salpingectomy.  6. Fetal monitoring - NST daily with fetal heart rate check q shift - Weekly BPP (fridays) , yesterday 6/8 -2 for fluid, NST reactive: BPP 8/10 7. Low back discomfort: Suspect MSK. Pt generally fairly active at home. Hasn't been this sedentary in some time. Seen by PT, exercises provided. Trial of lidoderm patch to low back

## 2020-06-25 NOTE — Progress Notes (Signed)
Patient complains of right and left abdominal tenderness upon palpation and change in position. Patient complains of pink discharge in toilet and upon wiping. Vital signs WNL. Dr. Claiborne Billings made aware, new orders given for continuous fetal monitoring.

## 2020-06-25 NOTE — Progress Notes (Signed)
RN called to alert me that pt reports small amount of pink when wiping after using the restroom.  She also reported new abdominal tenderness along bilateral lateral abdomen in horseshoe type pattern meeting over pubic bone.  Pt denies contractions, bleeding, fever/chills.  Good FM. Advised RN to place pt on continuous monitoring and check temp. On my arrival FHT Cat 1 and no tachycardia.  No contractions on toco. On abdominal exam of all quadrants, pt notes no cental uterine tenderness and no fundal tenderness, but does have deeper mild discomfort along sides of abdomen overlying lateral aspects of uterus.  She states she noticed it earlier when pulling up clothing.  She does not notice it unless she is touching the area.  She reports some mucous with a tiny amount of pink when wiping earlier, no bed blood. She otherwise feels fine. Advised pt to alert [place] if tenderness is worsening or if she feels feverish or chills.  Current temp is 97.6, will continue to otherwise monitor q4hrs.  Will keep on continuous FHT/TOCO until tomorrow morning.  If no worsening tenderness and no fetal tachy with reassuring strip will consider moving back to intermittent monitoring.  Pt is due for T&S tomorrow morning, will add CBC to assess WBCs.

## 2020-06-25 NOTE — Progress Notes (Signed)
Upon assessment pt told RN that she experienced new onset cramping in lower abd that she would rate 7/10 and describe as period like cramps. No uc seen on strip. Dr Claiborne Billings notified and verbal orders given to reassess temp and pain level and obtain UA. Okay to administer scheduled heparin if temp wnl. Pt states pain is not like uc but uncomfortable. Temp wnl and heparin given. No other complaints at this time.

## 2020-06-26 ENCOUNTER — Inpatient Hospital Stay (HOSPITAL_COMMUNITY): Payer: Medicaid Other | Admitting: Anesthesiology

## 2020-06-26 ENCOUNTER — Encounter (HOSPITAL_COMMUNITY)
Admission: AD | Disposition: A | Discharge status: Home / Self Care | Payer: Self-pay | Source: Home / Self Care | Attending: Obstetrics and Gynecology

## 2020-06-26 ENCOUNTER — Encounter (HOSPITAL_COMMUNITY): Payer: Self-pay | Admitting: Obstetrics and Gynecology

## 2020-06-26 LAB — URINALYSIS, ROUTINE W REFLEX MICROSCOPIC
Bilirubin Urine: NEGATIVE
Glucose, UA: 50 mg/dL — AB
Hgb urine dipstick: NEGATIVE
Ketones, ur: NEGATIVE mg/dL
Nitrite: NEGATIVE
Protein, ur: NEGATIVE mg/dL
Specific Gravity, Urine: 1.015 (ref 1.005–1.030)
WBC, UA: >50 WBC/hpf — ABNORMAL HIGH (ref 0–5)
pH: 5 (ref 5.0–8.0)

## 2020-06-26 LAB — CBC
HCT: 35.8 % — ABNORMAL LOW (ref 36.0–46.0)
HCT: 32.1 % — ABNORMAL LOW (ref 36.0–46.0)
Hemoglobin: 11.8 g/dL — ABNORMAL LOW (ref 12.0–15.0)
Hemoglobin: 10.8 g/dL — ABNORMAL LOW (ref 12.0–15.0)
MCH: 28.7 pg (ref 26.0–34.0)
MCH: 29.3 pg (ref 26.0–34.0)
MCHC: 33 g/dL (ref 30.0–36.0)
MCHC: 33.6 g/dL (ref 30.0–36.0)
MCV: 87.1 fL (ref 80.0–100.0)
MCV: 87 fL (ref 80.0–100.0)
Platelets: 225 10*3/uL (ref 150–400)
Platelets: 200 10*3/uL (ref 150–400)
RBC: 4.11 MIL/uL (ref 3.87–5.11)
RBC: 3.69 MIL/uL — ABNORMAL LOW (ref 3.87–5.11)
RDW: 13.2 % (ref 11.5–15.5)
RDW: 13 % (ref 11.5–15.5)
WBC: 13.4 10*3/uL — ABNORMAL HIGH (ref 4.0–10.5)
WBC: 16.6 10*3/uL — ABNORMAL HIGH (ref 4.0–10.5)
nRBC: 0 % (ref 0.0–0.2)
nRBC: 0 % (ref 0.0–0.2)

## 2020-06-26 LAB — TYPE AND SCREEN
ABO/RH(D): B POS
Antibody Screen: NEGATIVE

## 2020-06-26 LAB — CREATININE, SERUM
Creatinine, Ser: 0.68 mg/dL (ref 0.44–1.00)
GFR, Estimated: >60 mL/min (ref >60)

## 2020-06-26 SURGERY — Surgical Case
Anesthesia: Regional

## 2020-06-26 MED ORDER — KETOROLAC TROMETHAMINE 30 MG/ML IJ SOLN
30.0000 mg | Freq: Once | INTRAMUSCULAR | Status: AC | PRN
  Administered 2020-06-26: 30 mg via INTRAVENOUS

## 2020-06-26 MED ORDER — NALBUPHINE HCL 10 MG/ML IJ SOLN: 5.0000 mg | INTRAMUSCULAR | Status: DC | PRN

## 2020-06-26 MED ORDER — ENOXAPARIN SODIUM 60 MG/0.6ML ~~LOC~~ SOLN: 45.0000 mg | SUBCUTANEOUS | Status: DC

## 2020-06-26 MED ORDER — DIPHENHYDRAMINE HCL 50 MG/ML IJ SOLN: 12.5000 mg | INTRAMUSCULAR | Status: DC | PRN

## 2020-06-26 MED ORDER — SODIUM CHLORIDE 0.9 % IV SOLN
INTRAVENOUS | Status: DC | PRN
  Administered 2020-06-26: 500 mg via INTRAVENOUS

## 2020-06-26 MED ORDER — ACETAMINOPHEN 500 MG PO TABS: 1000.0000 mg | ORAL_TABLET | Freq: Four times a day (QID) | ORAL | Status: DC

## 2020-06-26 MED ORDER — OXYTOCIN-SODIUM CHLORIDE 30-0.9 UT/500ML-% IV SOLN
INTRAVENOUS | Status: DC | PRN
  Administered 2020-06-26: 30 [IU] via INTRAVENOUS

## 2020-06-26 MED ORDER — PHENYLEPHRINE HCL-NACL 20-0.9 MG/250ML-% IV SOLN
INTRAVENOUS | Status: AC
  Filled 2020-06-26: qty 250

## 2020-06-26 MED ORDER — IBUPROFEN 800 MG PO TABS
800.0000 mg | ORAL_TABLET | Freq: Three times a day (TID) | ORAL | Status: DC
  Administered 2020-06-26 – 2020-06-28 (×5): 800 mg via ORAL
  Filled 2020-06-26 (×5): qty 1

## 2020-06-26 MED ORDER — PHENYLEPHRINE HCL-NACL 20-0.9 MG/250ML-% IV SOLN
INTRAVENOUS | Status: DC | PRN
  Administered 2020-06-26: 60 ug/min via INTRAVENOUS

## 2020-06-26 MED ORDER — SOD CITRATE-CITRIC ACID 500-334 MG/5ML PO SOLN
ORAL | Status: AC
  Administered 2020-06-26: 30 mL
  Filled 2020-06-26: qty 15

## 2020-06-26 MED ORDER — FENTANYL CITRATE (PF) 100 MCG/2ML IJ SOLN
INTRAMUSCULAR | Status: DC | PRN
  Administered 2020-06-26: 15 ug via INTRATHECAL

## 2020-06-26 MED ORDER — TETANUS-DIPHTH-ACELL PERTUSSIS 5-2.5-18.5 LF-MCG/0.5 IM SUSY: 0.5000 mL | PREFILLED_SYRINGE | Freq: Once | INTRAMUSCULAR | Status: DC

## 2020-06-26 MED ORDER — SODIUM CHLORIDE 0.9 % IR SOLN
Status: DC | PRN
  Administered 2020-06-26: 1000 mL

## 2020-06-26 MED ORDER — CEPHALEXIN 250 MG PO CAPS
250.0000 mg | ORAL_CAPSULE | Freq: Four times a day (QID) | ORAL | Status: DC
  Administered 2020-06-26 (×2): 250 mg via ORAL
  Filled 2020-06-26 (×5): qty 1

## 2020-06-26 MED ORDER — FENTANYL CITRATE (PF) 100 MCG/2ML IJ SOLN
50.0000 ug | INTRAMUSCULAR | Status: DC | PRN
  Administered 2020-06-26: 50 ug via INTRAVENOUS
  Filled 2020-06-26: qty 2

## 2020-06-26 MED ORDER — FENTANYL CITRATE (PF) 100 MCG/2ML IJ SOLN: 25.0000 ug | INTRAMUSCULAR | Status: DC | PRN

## 2020-06-26 MED ORDER — ONDANSETRON HCL 4 MG/2ML IJ SOLN: 4.0000 mg | Freq: Three times a day (TID) | INTRAMUSCULAR | Status: DC | PRN

## 2020-06-26 MED ORDER — SIMETHICONE 80 MG PO CHEW
80.0000 mg | CHEWABLE_TABLET | Freq: Three times a day (TID) | ORAL | Status: DC
  Administered 2020-06-27 – 2020-06-28 (×5): 80 mg via ORAL
  Filled 2020-06-26 (×5): qty 1

## 2020-06-26 MED ORDER — WITCH HAZEL-GLYCERIN EX PADS: 1.0000 "application " | MEDICATED_PAD | CUTANEOUS | Status: DC | PRN

## 2020-06-26 MED ORDER — ONDANSETRON HCL 4 MG/2ML IJ SOLN
INTRAMUSCULAR | Status: AC
  Filled 2020-06-26: qty 2

## 2020-06-26 MED ORDER — OXYTOCIN-SODIUM CHLORIDE 30-0.9 UT/500ML-% IV SOLN
2.5000 [IU]/h | INTRAVENOUS | Status: AC
  Administered 2020-06-26: 2.5 [IU]/h via INTRAVENOUS

## 2020-06-26 MED ORDER — OXYCODONE HCL 5 MG PO TABS
5.0000 mg | ORAL_TABLET | ORAL | Status: DC | PRN
  Administered 2020-06-28: 10 mg via ORAL
  Filled 2020-06-26: qty 2

## 2020-06-26 MED ORDER — CEPHALEXIN 250 MG PO CAPS
250.0000 mg | ORAL_CAPSULE | Freq: Four times a day (QID) | ORAL | Status: DC
  Administered 2020-06-26 – 2020-06-28 (×7): 250 mg via ORAL
  Filled 2020-06-26 (×10): qty 1

## 2020-06-26 MED ORDER — BUPIVACAINE IN DEXTROSE 0.75-8.25 % IT SOLN
INTRATHECAL | Status: DC | PRN
  Administered 2020-06-26: 2 mL via INTRATHECAL

## 2020-06-26 MED ORDER — NALOXONE HCL 0.4 MG/ML IJ SOLN: 0.4000 mg | INTRAMUSCULAR | Status: DC | PRN

## 2020-06-26 MED ORDER — DIPHENHYDRAMINE HCL 25 MG PO CAPS: 25.0000 mg | ORAL_CAPSULE | ORAL | Status: DC | PRN

## 2020-06-26 MED ORDER — SODIUM CHLORIDE 0.9% FLUSH: 3.0000 mL | INTRAVENOUS | Status: DC | PRN

## 2020-06-26 MED ORDER — SIMETHICONE 80 MG PO CHEW: 80.0000 mg | CHEWABLE_TABLET | ORAL | Status: DC | PRN

## 2020-06-26 MED ORDER — DEXAMETHASONE SODIUM PHOSPHATE 4 MG/ML IJ SOLN
INTRAMUSCULAR | Status: DC | PRN
  Administered 2020-06-26: 4 mg via INTRAVENOUS

## 2020-06-26 MED ORDER — SCOPOLAMINE 1 MG/3DAYS TD PT72
1.0000 | MEDICATED_PATCH | Freq: Once | TRANSDERMAL | Status: DC
  Administered 2020-06-26: 1.5 mg via TRANSDERMAL

## 2020-06-26 MED ORDER — COMPLETENATE 29-1 MG PO CHEW
1.0000 | CHEWABLE_TABLET | Freq: Every day | ORAL | Status: DC
  Administered 2020-06-27 – 2020-06-28 (×2): 1 via ORAL
  Filled 2020-06-26 (×2): qty 1

## 2020-06-26 MED ORDER — MORPHINE SULFATE (PF) 0.5 MG/ML IJ SOLN
INTRAMUSCULAR | Status: DC | PRN
  Administered 2020-06-26: 150 ug via INTRATHECAL

## 2020-06-26 MED ORDER — LACTATED RINGERS IV SOLN: INTRAVENOUS | Status: DC | PRN

## 2020-06-26 MED ORDER — NALBUPHINE HCL 10 MG/ML IJ SOLN: 5.0000 mg | Freq: Once | INTRAMUSCULAR | Status: DC | PRN

## 2020-06-26 MED ORDER — CEFAZOLIN SODIUM-DEXTROSE 2-3 GM-%(50ML) IV SOLR
INTRAVENOUS | Status: DC | PRN
  Administered 2020-06-26: 2 g via INTRAVENOUS

## 2020-06-26 MED ORDER — SODIUM CHLORIDE 0.9 % IV SOLN
INTRAVENOUS | Status: AC
  Filled 2020-06-26: qty 500

## 2020-06-26 MED ORDER — LACTATED RINGERS IV SOLN: INTRAVENOUS | Status: DC

## 2020-06-26 MED ORDER — ACETAMINOPHEN 500 MG PO TABS
1000.0000 mg | ORAL_TABLET | Freq: Four times a day (QID) | ORAL | Status: DC
  Administered 2020-06-26 – 2020-06-28 (×7): 1000 mg via ORAL
  Filled 2020-06-26 (×7): qty 2

## 2020-06-26 MED ORDER — BETAMETHASONE SOD PHOS & ACET 6 (3-3) MG/ML IJ SUSP
12.0000 mg | INTRAMUSCULAR | Status: DC
Start: 2020-06-26 — End: 2020-06-26
  Administered 2020-06-26: 12 mg via INTRAMUSCULAR
  Filled 2020-06-26: qty 5

## 2020-06-26 MED ORDER — ONDANSETRON HCL 4 MG/2ML IJ SOLN
INTRAMUSCULAR | Status: DC | PRN
  Administered 2020-06-26: 4 mg via INTRAVENOUS

## 2020-06-26 MED ORDER — MORPHINE SULFATE (PF) 0.5 MG/ML IJ SOLN
INTRAMUSCULAR | Status: AC
  Filled 2020-06-26: qty 10

## 2020-06-26 MED ORDER — DIBUCAINE (PERIANAL) 1 % EX OINT: 1.0000 "application " | TOPICAL_OINTMENT | CUTANEOUS | Status: DC | PRN

## 2020-06-26 MED ORDER — COCONUT OIL OIL: 1.0000 "application " | TOPICAL_OIL | Status: DC | PRN

## 2020-06-26 MED ORDER — SCOPOLAMINE 1 MG/3DAYS TD PT72
MEDICATED_PATCH | TRANSDERMAL | Status: AC
  Filled 2020-06-26: qty 1

## 2020-06-26 MED ORDER — KETOROLAC TROMETHAMINE 30 MG/ML IJ SOLN: 30.0000 mg | Freq: Four times a day (QID) | INTRAMUSCULAR | Status: AC | PRN

## 2020-06-26 MED ORDER — ZOLPIDEM TARTRATE 5 MG PO TABS: 5.0000 mg | ORAL_TABLET | Freq: Every evening | ORAL | Status: DC | PRN

## 2020-06-26 MED ORDER — KETOROLAC TROMETHAMINE 30 MG/ML IJ SOLN
INTRAMUSCULAR | Status: AC
  Filled 2020-06-26: qty 1

## 2020-06-26 MED ORDER — DEXAMETHASONE SODIUM PHOSPHATE 4 MG/ML IJ SOLN
INTRAMUSCULAR | Status: AC
  Filled 2020-06-26: qty 2

## 2020-06-26 MED ORDER — PHENYLEPHRINE 40 MCG/ML (10ML) SYRINGE FOR IV PUSH (FOR BLOOD PRESSURE SUPPORT)
PREFILLED_SYRINGE | INTRAVENOUS | Status: AC
  Filled 2020-06-26: qty 10

## 2020-06-26 MED ORDER — MENTHOL 3 MG MT LOZG: 1.0000 | LOZENGE | OROMUCOSAL | Status: DC | PRN

## 2020-06-26 MED ORDER — SENNOSIDES-DOCUSATE SODIUM 8.6-50 MG PO TABS
2.0000 | ORAL_TABLET | Freq: Every day | ORAL | Status: DC
  Administered 2020-06-27: 2 via ORAL
  Filled 2020-06-26 (×2): qty 2

## 2020-06-26 MED ORDER — KETOROLAC TROMETHAMINE 30 MG/ML IJ SOLN: 30.0000 mg | Freq: Once | INTRAMUSCULAR | Status: DC | PRN

## 2020-06-26 MED ORDER — DEXTROSE 5 % IV SOLN
1.0000 ug/kg/h | INTRAVENOUS | Status: DC | PRN
  Filled 2020-06-26: qty 5

## 2020-06-26 MED ORDER — FENTANYL CITRATE (PF) 100 MCG/2ML IJ SOLN
INTRAMUSCULAR | Status: AC
  Filled 2020-06-26: qty 2

## 2020-06-26 MED ORDER — OXYTOCIN-SODIUM CHLORIDE 30-0.9 UT/500ML-% IV SOLN
INTRAVENOUS | Status: AC
  Filled 2020-06-26: qty 500

## 2020-06-26 MED ORDER — PHENYLEPHRINE HCL (PRESSORS) 10 MG/ML IV SOLN
INTRAVENOUS | Status: DC | PRN
  Administered 2020-06-26: 60 ug via INTRAVENOUS

## 2020-06-26 SURGICAL SUPPLY — 32 items
BENZOIN TINCTURE PRP APPL 2/3 (GAUZE/BANDAGES/DRESSINGS) ×2 IMPLANT
CHLORAPREP W/TINT 26ML (MISCELLANEOUS) ×2 IMPLANT
CLAMP CORD UMBIL (MISCELLANEOUS) IMPLANT
CLOSURE STERI STRIP 1/2 X4 (GAUZE/BANDAGES/DRESSINGS) ×2 IMPLANT
CLOTH BEACON ORANGE TIMEOUT ST (SAFETY) ×2 IMPLANT
DRSG OPSITE POSTOP 4X10 (GAUZE/BANDAGES/DRESSINGS) ×2 IMPLANT
ELECT REM PT RETURN 9FT ADLT (ELECTROSURGICAL) ×2
ELECTRODE REM PT RTRN 9FT ADLT (ELECTROSURGICAL) ×1 IMPLANT
EXTRACTOR VACUUM M CUP 4 TUBE (SUCTIONS) IMPLANT
GLOVE BIOGEL PI IND STRL 6.5 (GLOVE) ×1 IMPLANT
GLOVE BIOGEL PI IND STRL 7.0 (GLOVE) ×1 IMPLANT
GLOVE BIOGEL PI INDICATOR 6.5 (GLOVE) ×1
GLOVE BIOGEL PI INDICATOR 7.0 (GLOVE) ×1
GLOVE ECLIPSE 6.5 STRL STRAW (GLOVE) ×2 IMPLANT
GOWN STRL REUS W/TWL LRG LVL3 (GOWN DISPOSABLE) ×4 IMPLANT
KIT ABG SYR 3ML LUER SLIP (SYRINGE) IMPLANT
NEEDLE HYPO 25X5/8 SAFETYGLIDE (NEEDLE) IMPLANT
NS IRRIG 1000ML POUR BTL (IV SOLUTION) ×2 IMPLANT
PACK C SECTION WH (CUSTOM PROCEDURE TRAY) ×2 IMPLANT
PAD ABD 7.5X8 STRL (GAUZE/BANDAGES/DRESSINGS) IMPLANT
PAD OB MATERNITY 4.3X12.25 (PERSONAL CARE ITEMS) ×2 IMPLANT
PENCIL SMOKE EVAC W/HOLSTER (ELECTROSURGICAL) ×2 IMPLANT
RTRCTR C-SECT PINK 25CM LRG (MISCELLANEOUS) ×2 IMPLANT
SUT MON AB 2-0 CT1 27 (SUTURE) ×2 IMPLANT
SUT PDS AB 0 CTX 60 (SUTURE) IMPLANT
SUT PLAIN 2 0 XLH (SUTURE) IMPLANT
SUT VIC AB 0 CTX 36 (SUTURE) ×8
SUT VIC AB 0 CTX36XBRD ANBCTRL (SUTURE) ×4 IMPLANT
SUT VIC AB 4-0 KS 27 (SUTURE) ×2 IMPLANT
TOWEL OR 17X24 6PK STRL BLUE (TOWEL DISPOSABLE) ×2 IMPLANT
TRAY FOLEY W/BAG SLVR 14FR LF (SET/KITS/TRAYS/PACK) ×2 IMPLANT
WATER STERILE IRR 1000ML POUR (IV SOLUTION) ×2 IMPLANT

## 2020-06-26 NOTE — Brief Op Note (Addendum)
06/26/2020  8:25 PM  PATIENT:  Peggy [name]  35 y.o. female  PRE-OPERATIVE DIAGNOSIS:  CESAREAN SECTION Breech, PPROM, Labor  POST-OPERATIVE DIAGNOSIS:  CESAREAN SECTION Breech, PPROM, Labor  PROCEDURE:  Procedure(s): CESAREAN SECTION (N/A)- low transverse with bilateral salpingectomy  SURGEON:  Surgeon(s) and Role:    * Philip Aspen, DO - Primary    * Carrington Clamp, MD - Assisting  ANESTHESIA:   spinal/epidural combo  EBL:  Note excessive, see anesthesia report  FINDINGS: female infant, breech presentation, APGARS pending NICU assessment, wt: 3#15, abundant dilated vessels overlying bladder, normal ovaries and tubes bilaterally  BLOOD ADMINISTERED:none  SPECIMEN:  Source of Specimen:  placenta, cord blood, bilateral tubes  DISPOSITION OF SPECIMEN:  PATHOLOGY  COUNTS:  YES  PLAN OF CARE: Admit to inpatient   PATIENT DISPOSITION:  PACU - hemodynamically stable.   Delay start of Pharmacological VTE agent (>24hrs) due to surgical blood loss or risk of bleeding: no

## 2020-06-26 NOTE — Progress Notes (Signed)
Patient complained that she felt a pain in her lower abdomen upon standing stating that "it felt like everything was going to come out of me." Patient also complained of pink tinged urine. Dr. Claiborne Billings was called and new orders received. Tocometry reveals irregular contractions with UI.

## 2020-06-26 NOTE — Progress Notes (Signed)
Alerted by RN that cramping had become painful contractions.  I came to beside and checked pt's cervix, found it to be 1-2/thick.  Advised pt we would monitor to see if contractions became stronger/more frequent and would plan to recheck 2 hours later.  Was called by RN approx 1 hour later stating pt's contractions had become very strong and pt was requesting IV pain medication.  50 mcg IV fentanyl administered.  I came to bedside and rechecked pt, found to be 3/50/soft.  Bedside US performed to re-confirm persistent breech.  Reviewed with patient potential risk of abnormal placentation (accreta) with risk of heavy bleeding that may require hysterectomy at time of cesarean section.  Pt also desires bilateral salpingectomy.  Reviewed risk/complications and expectations.  Consent obtained.  Will administer 2g Ancef with 500mg  Azithromycin IV prior to C/S.

## 2020-06-26 NOTE — Progress Notes (Signed)
RN called to alert me that pt got up to bathroom to urinate and reported severe pain and felt like "everything was going to fall out" and more pink with wiping.  Once back in bed not as severe.   Pt remains afebrile with recent temp 97.8, HR 102, BP wnl. FHT: 150 mod var +accels, no decels TOCO: some UI and occ ctx SVE: deferred, but will recheck with increased contractions or persistent/worsening pain A/P: Advised will hold heparin for 2 hours then reassess, in the mean time SCDs should be placed and worn continuously, will make NPO and administer rescue course of steriods for FLM.  If suprapubic pain improves with treatment of UTI, will plan to restart heparin.  If worsening pain indicating signs of chorioamnionitis, fever, bleeding, NRFHT would consider proceeding to C/S.

## 2020-06-26 NOTE — Anesthesia Procedure Notes (Signed)
Epidural Patient location during procedure: OB Start time: 06/26/2020 7:00 PM End time: 06/26/2020 7:04 PM  Staffing Anesthesiologist: Elmer Picker, MD Performed: anesthesiologist   Preanesthetic Checklist Completed: patient identified, IV checked, risks and benefits discussed, monitors and equipment checked, pre-op evaluation and timeout performed  Epidural Patient position: sitting Prep: DuraPrep and site prepped and draped Patient monitoring: continuous pulse ox, blood pressure, heart rate and cardiac monitor Approach: midline Location: L3-L4 Injection technique: LOR air  Needle:  Needle type: Tuohy  Needle gauge: 17 G Needle length: 9 cm Needle insertion depth: 4 cm Catheter type: closed end flexible Catheter size: 19 Gauge Catheter at skin depth: 10 cm Test dose: negative  Assessment Sensory level: T8 Events: blood not aspirated, injection not painful, no injection resistance, no paresthesia and negative IV test  Additional Notes Patient identified. Risks/Benefits/Options discussed with patient including but not limited to bleeding, infection, nerve damage, paralysis, failed block, incomplete pain control, headache, blood pressure changes, nausea, vomiting, reactions to medication both or allergic, itching and postpartum back pain. Confirmed with bedside nurse the patient's most recent platelet count. Confirmed with patient that they are not currently taking any anticoagulation, have any bleeding history or any family history of bleeding disorders. Patient expressed understanding and wished to proceed. All questions were answered. Sterile technique was used throughout the entire procedure. Please see nursing notes for vital signs. Test dose was given through epidural catheter and negative prior to continuing to dose epidural or start infusion. Warning signs of high block given to the patient including shortness of breath, tingling/numbness in hands, complete motor block,  or any concerning symptoms with instructions to call for help. Patient was given instructions on fall risk and not to get out of bed. All questions and concerns addressed with instructions to call with any issues or inadequate analgesia.    Given history of endometrial ablation, a CSE was performed. 17G Tuohy advanced into epidural space without difficulty. LOR at 4cm. 25G Pencan needle advanced through Tuohy with return of CSF. Medications administered intrathecally as per chart. Epidural catheter inserted and left at 10cm at the skin. Pt tolerated procedure well. VSS. Reason for block:procedure for pain

## 2020-06-26 NOTE — Progress Notes (Signed)
26 y.I.[id number] w2d HD#15admitted for Preterm premature rupture of membranes. Pt is having persistent pain, since yesterday, it is localized now midline over the bladder with milder pain on lateral uterus unchanged.  She denies fever/chills, is having some persistent mild cramping.  Vitals:   06/25/20 2019 06/26/20 0000 06/26/20 0609 06/26/20 0819  BP: 124/82 (!) 120/91 124/84 124/83  Pulse: (!) 108 93 91 (!) 102  Resp: 18 18 18 18   Temp: 98.2 F (36.8 C) 97.7 F (36.5 C) 97.7 F (36.5 C) 97.8 F (36.6 C)  TempSrc: Oral Oral Oral Oral  SpO2: 98% 98% 99% 99%  Weight:      Height:       Abd: gravid, mild tenderness lateral uterus bilaterally, no fundal tenderness, no central anterior uterine tenderness Ext: no calf tenderness  Lab Results  Component Value Date   WBC 13.4 (H) 06/26/2020   HGB 11.8 (L) 06/26/2020   HCT 35.8 (L) 06/26/2020   MCV 87.1 06/26/2020   PLT 225 06/26/2020   Peggy [name] [id number] at [redacted]w[redacted]d, PPROM day #14  1. PPROM without signs of infection or labor - s/p latency antibiotics -No evidence of chorio at this time, however WBCs this morning (3/20) mildly elevated at 13.4, all prior WBC evaluations have been <10.  She as afebrile with suprapubic pain and UA showing +leuks, neg nits, culture pending.  Pt was started presumptively on oral Keflex 250mg  qid, first dose approx 8:30am.   - Plan for delivery around 34 weeks or earlier if labor, infection, or other maternal or fetal indications arise 2. Preterm - S/p MFM consult - Betamethasone course (3/5-3/6), eligible for rescue course if anticipate delivery >7 days from initial course and <34 weeks.  - Growth 10-19-1987 3/5: breech, 1788g (3lb15oz), AFI 10.4cm, normal appearing anterior placenta without sonographic signs of placenta accreta, cervix appears funneled with bulging membrane in the cervical canal noted. - s/p NICU consult  3. H/o PE/Factor V Leiden, previously on Lovenox 40mg  BID - Changed to  heparin 10,000U BID given risk of delivery -s/p phone consultation 3/9 with Dr. [place], anesthesia. He does not recommend protamine reversal should pt go into labor within 12 hours of last heparin injection. If close to 12 hours since last dose and labor sxs develop, hold dose and check PTT. If short interval from heparin dose will recommend general anesthesia. This was discussed with patient. - SCDs 4. H/o endometrial ablation - Normal appearing placenta on 3/5 5/9 without sonographic signs of placenta accreta - Patient aware of risk of placenta accreta spectrum and hysterectomy at time of delivery - Admission Hgb 12.0 - Maintain active T & S 5. Mode of delivery: - Still currently breech, patient aware if baby is breech when delivery is indicated, mode of delivery would be cesarean. If cesarean, patient desires permanent sterilization.Priordiscussion with Dr. [name] re:the risks/benefits of tubal ligation vs. salpingectomy and wishes to have bilateral salpingectomy.  6. Fetal monitoring - NST daily with fetal heart rate check q shift - Weekly BPP (fridays), yesterday 6/8 -2 for fluid, NST reactive: BPP 8/10 7. Low back discomfort: Suspect MSK. Pt generally fairly active at home. Hasn't been this sedentary in some time. Seen by PT, exercises provided. Trial of lidoderm patch to low back   [place]

## 2020-06-26 NOTE — Transfer of Care (Signed)
Immediate Anesthesia Transfer of Care Note  Patient: Peggy [name]  Procedure(s) Performed: CESAREAN SECTION (N/A )  Patient Location: PACU  Anesthesia Type:Spinal and Epidural  Level of Consciousness: awake  Airway & Oxygen Therapy: Patient Spontanous Breathing  Post-op Assessment: Report given to RN and Post -op Vital signs reviewed and stable  Post vital signs: Reviewed and stable  Last Vitals:  Vitals Value Taken Time  BP    Temp    Pulse    Resp    SpO2      Last Pain:  Vitals:   06/26/20 1725  TempSrc:   PainSc: 10-Worst pain ever      Patients Stated Pain Goal: 3 (06/26/20 1630)  Complications: No complications documented.

## 2020-06-26 NOTE — Anesthesia Preprocedure Evaluation (Signed)
Anesthesia Evaluation  Patient identified by MRN, date of birth, ID band Patient awake    Reviewed: Allergy & Precautions, NPO status , Patient's Chart, lab work & pertinent test results  Airway Mallampati: III  TM Distance: >3 FB Neck ROM: Full    Dental no notable dental hx.    Pulmonary Current Smoker and Patient abstained from smoking., PE   Pulmonary exam normal breath sounds clear to auscultation       Cardiovascular + DVT  Normal cardiovascular exam Rhythm:Regular Rate:Normal     Neuro/Psych PSYCHIATRIC DISORDERS Depression negative neurological ROS     GI/Hepatic negative GI ROS, Neg liver ROS,   Endo/Other  negative endocrine ROS  Renal/GU negative Renal ROS  negative genitourinary   Musculoskeletal negative musculoskeletal ROS (+)   Abdominal   Peds  Hematology Factor V Leiden on Lovenox as an outpatient. On heparin since admission. Last dose 10pm yesterday (06/25/20).     Anesthesia Other Findings [id number] [redacted]w[redacted]d HD#15 admitted for preterm premature rupture of membranes now presents for primary C/S for breech presentation and labor with cervical change  Reproductive/Obstetrics (+) Pregnancy H/o endometrial ablation 2018                             Anesthesia Physical Anesthesia Plan  ASA: III  Anesthesia Plan: Combined Spinal and Epidural   Post-op Pain Management:    Induction:   PONV Risk Score and Plan: Treatment may vary due to age or medical condition  Airway Management Planned: Natural Airway  Additional Equipment:   Intra-op Plan:   Post-operative Plan:   Informed Consent: I have reviewed the patients History and Physical, chart, labs and discussed the procedure including the risks, benefits and alternatives for the proposed anesthesia with the patient or authorized representative who has indicated his/her understanding and acceptance.       Plan Discussed  with: Anesthesiologist  Anesthesia Plan Comments: (Patient identified. Risks, benefits, options discussed with patient including but not limited to bleeding, infection, nerve damage, paralysis, failed block, incomplete pain control, headache, blood pressure changes, nausea, vomiting, reactions to medication, itching, and post partum back pain. Confirmed with bedside nurse the patient's most recent platelet count. Confirmed with the patient that they are not taking any anticoagulation, have any bleeding history or any family history of bleeding disorders. Patient expressed understanding and wishes to proceed. All questions were answered. )        Anesthesia Quick Evaluation

## 2020-06-26 NOTE — Progress Notes (Signed)
Alerted by RN approx 12pm while I was in OR, that pt was reporting feeling contractions for the past hour.  Advised ok to start IV access and admin LR 125cc/hr.  After leaving OR, I went to evaluate pt.  She stated she has a lot of focal suprapubic tenderness, no worsening tenderness anywhere else on uterus.  She states when she gets up to use the restroom, she feel like the baby is pressing on the bladder and its very painful.  Pt will be due for second dose of Keflex for UTI in approx 1 hr while culture is pending.  Pt states that after about an hour the contractions dissipated and she is no longer feeling them.  I advised we would forgo a cervical check, but if painful contractions develop would consider evaluating for possible labor changes.  Will continue to hold heparin for now, and have pt wear SCDs at all times. FHT 145 mod var, +accels, no decels, Cat 1 TOCO: none SVE: deferred A/P: 32.2 PPROM Possible UTI being treated presumptively Given afebrile and focal suprapubic tenderness, with no fetal tachycardia, reassuring tracing and pt remains afebrile, chorioamniontis less likely, but will be vigilant for signs If pt remains stable without any worsening symptoms, will consider restating heparin this evening.

## 2020-06-27 ENCOUNTER — Encounter (HOSPITAL_COMMUNITY): Payer: Self-pay | Admitting: Obstetrics and Gynecology

## 2020-06-27 LAB — CBC
HCT: 31.1 % — ABNORMAL LOW (ref 36.0–46.0)
Hemoglobin: 10.2 g/dL — ABNORMAL LOW (ref 12.0–15.0)
MCH: 28.7 pg (ref 26.0–34.0)
MCHC: 32.8 g/dL (ref 30.0–36.0)
MCV: 87.4 fL (ref 80.0–100.0)
Platelets: 190 10*3/uL (ref 150–400)
RBC: 3.56 MIL/uL — ABNORMAL LOW (ref 3.87–5.11)
RDW: 12.9 % (ref 11.5–15.5)
WBC: 17.9 10*3/uL — ABNORMAL HIGH (ref 4.0–10.5)
nRBC: 0 % (ref 0.0–0.2)

## 2020-06-27 LAB — CULTURE, OB URINE

## 2020-06-27 MED ORDER — ENOXAPARIN SODIUM 40 MG/0.4ML ~~LOC~~ SOLN
40.0000 mg | SUBCUTANEOUS | Status: DC
  Administered 2020-06-27: 40 mg via SUBCUTANEOUS
  Filled 2020-06-27: qty 0.4

## 2020-06-27 MED ORDER — ENOXAPARIN SODIUM 40 MG/0.4ML ~~LOC~~ SOLN
40.0000 mg | Freq: Two times a day (BID) | SUBCUTANEOUS | Status: DC
  Administered 2020-06-28: 40 mg via SUBCUTANEOUS
  Filled 2020-06-27 (×2): qty 0.4

## 2020-06-27 NOTE — Progress Notes (Addendum)
Subjective: Postpartum Day 1: Cesarean Delivery Patient reports incisional pain and tolerating PO. Reports pain managed with PO meds/toradol   Objective: Vitals:   06/26/20 2330 06/27/20 0111 06/27/20 0506 06/27/20 0748  BP: 111/80 123/62 110/66 109/73  Pulse: (!) 109 94 (!) 101 88  Resp: 16 18 18 18   Temp: 98.2 F (36.8 C) 98.1 F (36.7 C) 98.4 F (36.9 C) 97.8 F (36.6 C)  TempSrc:  Oral Oral Oral  SpO2: 100% 100% 100% 100%  Weight:      Height:         Physical Exam:  General: alert Lochia: appropriate Uterine Fundus: firm Incision: dressing clean and dry DVT Evaluation: No evidence of DVT seen on physical exam. SCDs on  Recent Labs    06/26/20 2218 06/27/20 0459  HGB 10.8* 10.2*  HCT 32.1* 31.1*    Assessment/Plan: Status post Cesarean section. Doing well postoperatively.  Continue current care. CHRISTAN CICCARELLI [name] POD#1 sp rCS and bilateral salpingectomy at [redacted]w[redacted]d due to PPROM/PTL 1. PPC: EBL 507cc, Hgb stable 10 2. H/o PE/Factor V Leiden, previously on Lovenox 40mg  BID in pregnancy. Plan to discharge with 6w course intermediate dose, 40mg  BID and f/u with cardiology outpatient for further management (on lifelong anticoagulation) 3. Possible UTI, treated with Keflex. Urine culture multiple species present. Will resend. Reports unsure if symptoms resolved due to incisional pain  4. Vaccines: s/p tdap in pregnancy. Declines COVID, Flu vaccine  Rubella immune, blood type B POS, breastfeeding, baby boy in NICU  Alleyah Twombly K Taam-Akelman 06/27/2020, 7:52 AM

## 2020-06-27 NOTE — Lactation Note (Signed)
Lactation Consultation Note  Patient Name: SHAMIRA TOUTANT NFAOZ'H Date: 06/27/2020 Reason for consult: Follow-up assessment Age:35 y.o.   LC Follow Up Visit:  Attempted to visit with mother, however, she is asleep.  Spoke with RN who will call me when she has to awaken mother soon.  I will discuss pump initiation with mother at that time.     Maternal Data    Feeding    LATCH Score                    Lactation Tools Discussed/Used    Interventions    Discharge    Consult Status Consult Status: Follow-up Date: 06/27/20 Follow-up type: In-patient    Dora Sims 06/27/2020, 4:46 AM

## 2020-06-27 NOTE — Lactation Note (Signed)
This note was copied from a baby's chart. Lactation Consultation Note  Patient Name: Peggy [name] Date: 06/27/2020   Age:34 hours P4, premature infant in NICU. LC entered room, mom asleep at this time. LC services will follow up with mom in morning.  Maternal Data    Feeding    LATCH Score                    Lactation Tools Discussed/Used    Interventions    Discharge    Consult Status      [name] 06/27/2020, 12:57 AM

## 2020-06-27 NOTE — Progress Notes (Deleted)
Patient screened out for psychosocial assessment since none of the following apply: °Psychosocial stressors documented in mother or baby's chart °Gestation less than 32 weeks °Code at delivery  °Infant with anomalies °Please contact the Clinical Social Worker if specific needs arise, by MOB's request, or if MOB scores greater than 9/yes to question 10 on Edinburgh Postpartum Depression Screen. ° °Angel Boyd-Gilyard, MSW, LCSW °Clinical Social Work °[phone] °  °

## 2020-06-27 NOTE — Anesthesia Postprocedure Evaluation (Signed)
Anesthesia Post Note  Patient: Peggy [name]  Procedure(s) Performed: CESAREAN SECTION (N/A )     Patient location during evaluation: PACU Anesthesia Type: Spinal Level of consciousness: oriented and awake and alert Pain management: pain level controlled Vital Signs Assessment: post-procedure vital signs reviewed and stable Respiratory status: spontaneous breathing, respiratory function stable and patient connected to nasal cannula oxygen Cardiovascular status: blood pressure returned to baseline and stable Postop Assessment: no headache, no backache and no apparent nausea or vomiting Anesthetic complications: no   No complications documented.  Last Vitals:  Vitals:   06/26/20 2330 06/27/20 0111  BP: 111/80 123/62  Pulse: (!) 109 94  Resp: 16 18  Temp: 36.8 C 36.7 C  SpO2: 100% 100%    Last Pain:  Vitals:   06/27/20 0111  TempSrc: Oral  PainSc: 7    Pain Goal: Patients Stated Pain Goal: 3 (06/26/20 1630)                 Phallon Haydu L Maliea Grandmaison

## 2020-06-27 NOTE — Progress Notes (Signed)
Patient screened out for psychosocial assessment since none of the following apply: °Psychosocial stressors documented in mother or baby's chart °Gestation less than 32 weeks °Code at delivery  °Infant with anomalies °Please contact the Clinical Social Worker if specific needs arise, by MOB's request, or if MOB scores greater than 9/yes to question 10 on Edinburgh Postpartum Depression Screen. ° °Jabarie Pop Boyd-Gilyard, MSW, LCSW °Clinical Social Work °[phone] °  °

## 2020-06-27 NOTE — Lactation Note (Signed)
This note was copied from a baby's chart. Lactation Consultation Note  Patient Name: Peggy [name] Date: 06/27/2020 Reason for consult: Initial assessment;Preterm <34wks;NICU baby;Infant < 6lbs Age:34 hours  P4 mother whose infant is now 76 hours old.  This is a 32+2 week baby weighing <4 lbs and in the NICU.  This is mother's first preterm baby.  She breast fed all of her other children (ages 40, 18 and 78 years old)  The last child breast fed for 17 months.  RN called as discussed earlier.  Mother willing to begin pumping with the DEBP.  Pump parts, assembly, disassembly and cleaning reviewed.  Observed mother pumping and the #24 flanges are appropriate at this time, however, I educated mother on how to determine when she will need to move to the #27 flanges.  Mother verbalized understanding.  Wash station set up.  Mother has contacted her insurance company and the pump will be available for pick up within the next couple of days.  Asked mother to call for any questions/concerns.  Father present.   Maternal Data Has patient been taught Hand Expression?: Yes Does the patient have breastfeeding experience prior to this delivery?: Yes How long did the patient breastfeed?: Breast fed all her other three children; last one for 17 months  Feeding Mother's Current Feeding Choice: Breast Milk  LATCH Score                    Lactation Tools Discussed/Used    Interventions    Discharge Pump: DEBP;Manual;Personal  Consult Status Consult Status: Follow-up Date: 06/28/20 Follow-up type: In-patient    Beth R DelFava 06/27/2020, 6:29 AM

## 2020-06-28 LAB — URINE CULTURE: Culture: NO GROWTH

## 2020-06-28 MED ORDER — OXYCODONE-ACETAMINOPHEN 5-325 MG PO TABS: 1.0000 | ORAL_TABLET | Freq: Four times a day (QID) | ORAL | 0 refills | Status: DC | PRN

## 2020-06-28 MED ORDER — ENOXAPARIN SODIUM 40 MG/0.4ML ~~LOC~~ SOLN: 40.0000 mg | SUBCUTANEOUS | 0 refills | Status: AC

## 2020-06-28 MED ORDER — IBUPROFEN 600 MG PO TABS: 600.0000 mg | ORAL_TABLET | Freq: Four times a day (QID) | ORAL | 0 refills | Status: AC | PRN

## 2020-06-28 NOTE — Lactation Note (Signed)
This note was copied from a baby's chart. Lactation Consultation Note  Patient Name: Boy Charli Halle EKCMK'L Date: 06/28/2020 Reason for consult: NICU baby;Follow-up assessment Age:34 hours Mom to d/c today. We reviewed pumping and IDF. Will plan f/u in NICU.  Maternal Data Has patient been taught Hand Expression?: Yes Does the patient have breastfeeding experience prior to this delivery?: Yes How long did the patient breastfeed?: 1+ years Mom has pump from insurance. She may apply for [facility] and receive hospital grade pump. Mom with hx breast augmentation and mastitis.  Pumping q2-3 hours Feeding Mother's Current Feeding Choice: Breast Milk and Donor Milk  Interventions Interventions: Education;Expressed milk;Breast massage;DEBP;Ice  Discharge Discharge Education: Engorgement and breast care Pump: DEBP;Personal  Consult Status Consult Status: Follow-up Follow-up type: In-patient   Elder Negus, MA IBCLC 06/28/2020, 10:09 AM

## 2020-06-28 NOTE — Discharge Summary (Signed)
Postpartum Discharge Summary      Patient Name: Peggy [name] DOB: 09/02/1985 MRN: [id number]  Date of admission: 06/11/2020 Delivery date:06/26/2020  Delivering provider: Philip Aspen  Date of discharge: 06/28/2020  Admitting diagnosis: Preterm premature rupture of membranes [O42.919] Intrauterine pregnancy: [redacted]w[redacted]d     Secondary diagnosis:  Active Problems:   Preterm premature rupture of membranes  Additional problems: Breech presentation, Preterm delivery, Therapeutic Anticoagulation for history of PE     Discharge diagnosis: Preterm Pregnancy Delivered and PPROM, Fetal malpresentation, desired permanent sterilization                                              Post partum procedures:NA Augmentation: N/A Complications: None  Hospital course: Admitted for PPROM @ 30 weeks. Received antibiotics for latency, MFM consultation and monitoring. Remained stable for 2 weeks then had spontaneous onset of labor. Cesarean delivery due to breech presentation. Therapeutic anticoagulation for history of PE   Magnesium Sulfate received: No BMZ received: Yes Rhophylac:No   Physical exam  Vitals:   06/27/20 1940 06/27/20 2256 06/28/20 0422 06/28/20 0811  BP: 106/81 102/64 (!) 97/59 97/68  Pulse: 89 81 71 85  Resp: 18 18 18 18   Temp: 98.6 F (37 C) 97.7 F (36.5 C) 98 F (36.7 C) 98 F (36.7 C)  TempSrc: Oral Oral Oral Oral  SpO2: 99% 99% 99% 99%  Weight:      Height:       General: alert, cooperative and no distress Lochia: appropriate Uterine Fundus: firm Incision: Healing well with no significant drainage DVT Evaluation: No evidence of DVT seen on physical exam. Labs: Lab Results  Component Value Date   WBC 17.9 (H) 06/27/2020   HGB 10.2 (L) 06/27/2020   HCT 31.1 (L) 06/27/2020   MCV 87.4 06/27/2020   PLT 190 06/27/2020   CMP Latest Ref Rng & Units 06/26/2020  Glucose 70 - 99 mg/dL -  BUN 6 - 20 mg/dL -  Creatinine 06/28/2020 - 0.86 mg/dL 7.61  Sodium 9.50 - 932 mmol/L  -  Potassium 3.5 - 5.1 mmol/L -  Chloride 98 - 111 mmol/L -  CO2 22 - 32 mmol/L -  Calcium 8.9 - 10.3 mg/dL -  Total Protein 6.5 - 8.1 g/dL -  Total Bilirubin 0.3 - 1.2 mg/dL -  Alkaline Phos 38 - 671 U/L -  AST 15 - 41 U/L -  ALT 0 - 44 U/L -   Edinburgh Score: No flowsheet data found.    After visit meds:  Allergies as of 06/28/2020      Reactions   Caffeine Anaphylaxis   Discussed with pt on 3/13--reports allergy test positive for caffeine as a teen, but currently drinks multiple beverages with caffeine (coffee, soda, etc) without reaction    Peanut Butter Flavor Anaphylaxis      Medication List    STOP taking these medications   cetirizine 10 MG tablet Commonly known as: ZYRTEC   cyclobenzaprine 10 MG tablet Commonly known as: FLEXERIL   fluticasone 50 MCG/ACT nasal spray Commonly known as: FLONASE     TAKE these medications   enoxaparin 40 MG/0.4ML injection Commonly known as: LOVENOX Inject 0.4 mLs (40 mg total) into the skin daily. What changed: when to take this   ibuprofen 600 MG tablet Commonly known as: ADVIL Take 1 tablet (600 mg total) by  mouth every 6 (six) hours as needed.   oxyCODONE-acetaminophen 5-325 MG tablet Commonly known as: PERCOCET/ROXICET Take 1-2 tablets by mouth every 6 (six) hours as needed for severe pain.   prenatal multivitamin Tabs tablet Take 1 tablet by mouth daily at 12 noon.            Discharge Care Instructions  (From admission, onward)         Start     Ordered   06/28/20 0000  Discharge wound care:       Comments: You may wash incision with soap and water.  Do not soak or submerge the incision for 2 weeks. Keep incision dry. You may need to keep a sanitary pad or panty liner between the incision and your clothing for comfort and to keep the incision dry. If you note drainage, increased pain, or increased redness of the incision, then please notify your physician.   06/28/20 1037           Discharge  home in stable condition Infant Feeding: Bottle and Breast Infant Disposition:NICU Discharge instruction: per After Visit Summary and Postpartum booklet. Activity: Advance as tolerated. Pelvic rest for 6 weeks.  Diet: routine diet Anticipated Birth Control: Bilateral salpingectomy done at cesarean Postpartum Appointment:4 weeks Future Appointments:No future appointments. Follow up Visit:      06/28/2020 Waynard Reeds, MD

## 2020-06-29 LAB — SURGICAL PATHOLOGY

## 2020-06-30 ENCOUNTER — Ambulatory Visit: Payer: Self-pay

## 2020-06-30 NOTE — Op Note (Signed)
NAME: Peggy [name], OUTTEN MEDICAL RECORD NO: [id number] ACCOUNT NO: [id number] DATE OF BIRTH: February 22, 1986 FACILITY: [facility] LOCATION: [facility] PHYSICIAN: Virgina Evener. Claiborne Billings, DO  Operative Report   DATE OF PROCEDURE: 06/26/2020  PREOPERATIVE DIAGNOSES:  32+ weeks' pregnancy, preterm premature rupture of the membranes, labor, breech.  POSTOPERATIVE DIAGNOSES:  32+ weeks' pregnancy, preterm premature rupture of the membranes, labor, breech.  PROCEDURE:  Low transverse cesarean section with bilateral salpingectomy.  SURGEONS:  Virgina Evener. Claiborne Billings, DO, and Carrington Clamp, MD  ANESTHESIA:  Spinal/epidural combination.  ESTIMATED BLOOD LOSS:  Please see anesthesia report, not visually excessive.  FINDINGS:  Female infant, breech presentation, Apgars 5 and 8, weight 3 pounds 15 ounces, abundant dilated vessels overlying bladder otherwise normal, normal ovaries and tubes bilaterally.  SPECIMENS:  Placenta, cord blood, bilateral tubes.  COMPLICATIONS:  None.  CONDITION:  Stable to PACU.  DESCRIPTION OF PROCEDURE:  The patient was taken to the operating room where anesthesia as above was administered and found to be adequate.  She was prepped and draped in the normal sterile fashion in dorsal supine position.  A scalpel was used to make a  Pfannenstiel skin incision, which was carried down to underlying layer of fascia with Bovie cautery.  The fascia was incised in the midline with the scalpel and extended laterally with Mayo scissors.  Kocher clamps were placed at the superior aspect of  the fascial incision.  The rectus muscles were dissected off bluntly and sharply.  Kocher clamps were then placed at the inferior aspect of the fascial incision.  Rectus muscles were dissected off bluntly and sharply.  A hemostat was used to separate  rectus muscles superiorly, and with good visualization of the peritoneum, it was entered bluntly.  The peritoneal incision was extended by lateral traction.  The abdomen and  pelvis were manually surveyed and no abnormalities noted.  An Information systems manager was then placed.  The lower uterine segment was moderately developed to allow for a mid transverse hysterotomy.  As noted, abundant dilated vasculature was noted in the pelvis.  The hysterotomy was made with the scalpel and carried down  carefully to the amniotic sac, which was entered sharply.  Bandage scissors were used to extend the incision to the left, and the infant's buttocks were located and delivered through the hysterotomy followed by both lower extremities.  The infant's body  was rotated and the arms were swept anteriorly and inferiorly across the chest with infant in flexed position and the head was delivered without difficulty.  The infant's cord was doubly clamped and cut, and the infant was handed off immediately to the  awaiting neonatology team.  The placenta was then delivered with gentle traction on the umbilical cord and the uterus was cleared of all clot and debris.  Due to the thicker nature of the anterior myometrium, the hysterotomy was closed in a similar  fashion to a classical incision where the muscle was closed, first the inner 50% followed by the outer 50% followed by the serosal layer.  This was done in a running locked fashion for the first two layers with 0 Vicryl followed by running fashion for  the serosal layer.  Excellent hemostasis was noted.  Both tubes and ovaries were examined and found to be normal.  The left tube was elevated with the use of Babcock clamps with good visualization of dilated vessels and the mesosalpinx.  Three small  windows were placed.  Chromic was used to ligate the entire mesosalpinx with good  visualization of the larger vessels.  An additional stitch of chromic was placed proximally on the tube.  A Bovie was used to excise the tube and distal mesosalpinx and  excellent hemostasis was noted.  Same procedure was performed on the left, and again, excellent  hemostasis was noted.  The uterine incision was reevaluated and found to be hemostatic.  Peritoneum was then reapproximated and closed with Monocryl in a  running fashion.  Fascia was then reapproximated and closed with Vicryl in a running fashion.  Subcutaneous tissue was irrigated and dried.  Minimal use of Bovie cautery as needed.  Less than 2 cm of subcutaneous tissue was noted; therefore, skin was  reapproximated and closed with Vicryl on a Keith needle in a subcuticular fashion.  The patient tolerated the procedure well.  Sponge, lap, and needle counts were correct x2.  The patient was taken to recovery in stable condition.   ROH D: 06/30/2020 12:58:38 pm T: 06/30/2020 2:08:00 pm  JOB: [id number]/ [id number]

## 2020-06-30 NOTE — Lactation Note (Signed)
This note was copied from a baby's chart. Lactation Consultation Note  Patient Name: Peggy [name] YKZLD'J Date: 06/30/2020 Reason for consult: NICU baby;Follow-up assessment;Mother's request Age:35 years  Maternal Data  Experienced mom who bf for 1+ years with other children. Mom is pumping q 2.5 hours. She had no copious milk at 90+ hours postpartum. Provided education and suggested mom watch Laverle Hobby, MD [facility].) video on "hands-on" pumping. Reviewed IDF. Will plan f/u to check for copious milk tomorrow.   Feeding Mother's Current Feeding Choice: Breast Milk and Donor Milk  Interventions Interventions: Education   Consult Status Consult Status: Follow-up Follow-up type: In-patient   Elder Negus, MA IBCLC 06/30/2020, 3:40 PM

## 2020-07-01 ENCOUNTER — Ambulatory Visit: Payer: Self-pay

## 2020-07-01 NOTE — Lactation Note (Signed)
This note was copied from a baby's chart. Lactation Consultation Note  Patient Name: Peggy [name] EOFHQ'R Date: 07/01/2020   Age:34 days   LC asked baby's RN to call when Mom arrives to visit with baby to support Mom's pumping.    Peggy, [name] 07/01/2020, 9:21 AM

## 2020-07-02 ENCOUNTER — Ambulatory Visit: Payer: Self-pay

## 2020-07-02 NOTE — Lactation Note (Signed)
This note was copied from a baby's chart. Lactation Consultation Note  Patient Name: Peggy [name] IDPOE'U Date: 07/02/2020 Reason for consult: Follow-up assessment;NICU baby;Infant < 6lbs;Infant weight loss;Preterm <34wks Age:34 days  Visited with mom of 22 days old pre-term female, she's a P4 and comes here to the NICU to visit her baby mainly during evenings. Mom doing STS with baby when entered the room, praised her for her efforts. She showed LC the two milk bottles that she pumped for her baby, and she probably had around 7-10 ml in each, both were from two separate pumping sessions, she's pumping bilaterally.  Per mom, she's pumping every 3 hours during the day and only having a 4 hour break during the night. Reviewed some pumping tips and also spoke to mom about power pumping during her first pumping session in the AM, she was a little concerned that her supply hasn't increased in the last 2 days.  Feeding plan:  1. Encouraged mom to continue pumping every 2-3 hours, ideally 8 pumping sessions in 24 hours 2. She'll try power pumping in the AM; she understands that it will take at least 1-2 weeks before she starts seeing changes  FOB present and supportive. Parents reported all questions and concerns were answered, they're both aware of LC OP services and will call PRN.   Maternal Data    Feeding Mother's Current Feeding Choice: Breast Milk and Donor Milk  LATCH Score                    Lactation Tools Discussed/Used Tools: Pump Breast pump type: Double-Electric Breast Pump Pumping frequency: q 3 hours Pumped volume: 10 mL  Interventions Interventions: Breast feeding basics reviewed;DEBP;Skin to skin  Discharge Pump: DEBP  Consult Status Consult Status: Follow-up Date: 07/03/20 Follow-up type: In-patient    Milagros Venetia Constable 07/02/2020, 8:23 PM

## 2020-07-02 NOTE — Lactation Note (Signed)
This note was copied from a baby's chart. Lactation Consultation Note  Patient Name: Boy Eduardo Wurth GSUPJ'S Date: 07/02/2020   Age:34 days  Attempted to visit with mom, but she wasn't present at the bedside. NICU RN voiced that she might come back later tonight, she has other kids at home. LC will attempt to visit again later this evening.   Maternal Data    Feeding    LATCH Score                    Lactation Tools Discussed/Used    Interventions    Discharge    Consult Status      Milagros Venetia Constable 07/02/2020, 5:24 PM

## 2020-07-04 ENCOUNTER — Ambulatory Visit: Payer: Self-pay

## 2020-07-04 NOTE — Lactation Note (Signed)
This note was copied from a baby's chart. Lactation Consultation Note  Patient Name: Peggy [name] TRVUY'E Date: 07/04/2020   Age:34 days   Lactation went to visit mom.  Mom was not in the room.   Lactation will follow up with mom this evening or tomorrow.    Maternal Data    Feeding    LATCH Score                    Lactation Tools Discussed/Used    Interventions    Discharge    Consult Status      Peggy [name] [name] 07/04/2020, 7:32 PM

## 2020-07-08 ENCOUNTER — Ambulatory Visit: Payer: Self-pay

## 2020-07-08 NOTE — Lactation Note (Signed)
This note was copied from a baby's chart. Lactation Consultation Note  Patient Name: Boy Abbagail Scaff GYJEH'U Date: 07/08/2020 Reason for consult: NICU baby;Mother's request (low milk supply) Age:34 days  Maternal Data  Mom 12 days pp with low milk volume. This mom made sufficient milk volume with other 3 children. She is pumping q3 during the day and q4 at night. Total volume in 24 hours is about . Normal volume at 12 days pp is 350-556mLs per 24 hours.  Suggested mom f/u with her provider for possible contributing factors and interventions prn. Encouraged her to continue pumping. Will f/u Tuesday.   Feeding Mother's Current Feeding Choice: Breast Milk   Lactation Tools Discussed/Used Pumping frequency: 8x Pumped volume: 7 mL   Consult Status Consult Status: Follow-up Date: 07/12/20 Follow-up type: In-patient   Elder Negus, MA IBCLC 07/08/2020, 3:44 PM

## 2020-07-13 ENCOUNTER — Ambulatory Visit: Payer: Self-pay

## 2020-07-13 NOTE — Lactation Note (Signed)
This note was copied from a baby's chart. Lactation Consultation Note  Patient Name: Boy Aislynn Cifelli YBOFB'P Date: 07/13/2020 Reason for consult: NICU baby;Follow-up assessment Age:34 wk.o.   Mom's milk supply remains low but has increased since last visit. She pumps q 2.5 + 1-hour of power pumping daily and q4 at night. Her average yield is . Her OB wrote rx for Reglan: tid for 3 weeks. She will begin that today. We also discussed strategies to improve milk letdown during pumping sessions (holding baby, looking at pictures of baby, etc.). Mom will continue latch attempts. Will plan f/u Monday at 1130 to see if milk supply has improved.  Feeding Mother's Current Feeding Choice: Breast Milk and Donor Milk   Consult Status Consult Status: Follow-up Date: 07/18/20 (1130) Follow-up type: In-patient   Elder Negus, MA IBCLC 07/13/2020, 1:53 PM

## 2020-07-18 ENCOUNTER — Ambulatory Visit: Payer: Self-pay

## 2020-07-18 NOTE — Lactation Note (Signed)
This note was copied from a baby's chart. Lactation Consultation Note LC f/u today to assess mom's milk supply. SLP was present and counseling on bottle feeding.   Patient Name: Peggy [name] [id number] Date: 07/18/2020 Reason for consult: NICU baby;Follow-up assessment Age:34 wk.o.  Maternal Data  Mom began Reglan for low milk supply 5 days ago. Today her milk volume has increased dramatically. This rapid increase is consistent with a rising prolactin level. Her yield has increased from to . Although her production remains below normal at 3 weeks pp, her supply will likely continue to increase over the next two weeks into the normal range. We reviewed importance of pre-pumping before bf'ing attempts.   Feeding Mother's Current Feeding Choice: Breast Milk and Donor Milk   Consult Status Consult Status: Follow-up Follow-up type: In-patient   Elder Negus, MA IBCLC 07/18/2020, 12:44 PM

## 2020-07-21 ENCOUNTER — Ambulatory Visit: Payer: Self-pay

## 2020-07-21 NOTE — Lactation Note (Signed)
This note was copied from a baby's chart. Lactation Consultation Note  Patient Name: Peggy [name]'T Date: 07/21/2020 Age:34 wk.o.  LC in to room to deliver a new pumping "tube top" per mother's request.   Inez Stantz A Higuera Ancidey 07/21/2020, 5:22 PM

## 2020-08-06 ENCOUNTER — Ambulatory Visit: Payer: Self-pay

## 2020-08-06 NOTE — Lactation Note (Signed)
This note was copied from a baby's chart. Lactation Consultation Note  Patient Name: Peggy [name] [id number]'L Date: 08/06/2020   Age:34 wk.o.  Attempted to visit with mom, but she wasn't in the room. NICU RN Ladona Ridgel reported to [facility] that baby should be going home tomorrow if his episodes of bradycardia are resolved. LC updated NICU binder with possible d/c tomorrow, mom will be here during day shift.   Maternal Data    Feeding    LATCH Score                    Lactation Tools Discussed/Used    Interventions    Discharge    Consult Status      Nai Borromeo Venetia Constable 08/06/2020, 10:08 PM

## 2021-03-14 ENCOUNTER — Ambulatory Visit (HOSPITAL_COMMUNITY): Payer: Medicaid Other | Attending: Physician Assistant | Admitting: Physical Therapy

## 2021-04-19 ENCOUNTER — Ambulatory Visit (HOSPITAL_COMMUNITY): Payer: Medicaid Other | Attending: Physician Assistant | Admitting: Physical Therapy

## 2022-02-18 ENCOUNTER — Emergency Department (HOSPITAL_COMMUNITY): Payer: Medicaid Other

## 2022-02-18 ENCOUNTER — Encounter (HOSPITAL_COMMUNITY): Payer: Self-pay | Admitting: Pharmacy Technician

## 2022-02-18 ENCOUNTER — Emergency Department (HOSPITAL_COMMUNITY)
Admission: EM | Admit: 2022-02-18 | Discharge: 2022-02-18 | Disposition: A | Discharge status: Home / Self Care | Payer: Medicaid Other | Attending: Emergency Medicine | Admitting: Emergency Medicine

## 2022-02-18 ENCOUNTER — Other Ambulatory Visit: Payer: Self-pay

## 2022-02-18 DIAGNOSIS — Z91010 Allergy to peanuts: Secondary | ICD-10-CM | POA: Insufficient documentation

## 2022-02-18 DIAGNOSIS — N809 Endometriosis, unspecified: Secondary | ICD-10-CM | POA: Diagnosis not present

## 2022-02-18 DIAGNOSIS — R1032 Left lower quadrant pain: Secondary | ICD-10-CM | POA: Diagnosis present

## 2022-02-18 DIAGNOSIS — N9489 Other specified conditions associated with female genital organs and menstrual cycle: Secondary | ICD-10-CM | POA: Diagnosis not present

## 2022-02-18 LAB — CBC WITH DIFFERENTIAL/PLATELET
Abs Immature Granulocytes: 0.03 10*3/uL (ref 0.00–0.07)
Basophils Absolute: 0.1 10*3/uL (ref 0.0–0.1)
Basophils Relative: 1 %
Eosinophils Absolute: 0.2 10*3/uL (ref 0.0–0.5)
Eosinophils Relative: 2 %
HCT: 40.6 % (ref 36.0–46.0)
Hemoglobin: 12.8 g/dL (ref 12.0–15.0)
Immature Granulocytes: 0 %
Lymphocytes Relative: 18 %
Lymphs Abs: 1.9 10*3/uL (ref 0.7–4.0)
MCH: 28.4 pg (ref 26.0–34.0)
MCHC: 31.5 g/dL (ref 30.0–36.0)
MCV: 90 fL (ref 80.0–100.0)
Monocytes Absolute: 0.5 10*3/uL (ref 0.1–1.0)
Monocytes Relative: 5 %
Neutro Abs: 7.4 10*3/uL (ref 1.7–7.7)
Neutrophils Relative %: 74 %
Platelets: 310 10*3/uL (ref 150–400)
RBC: 4.51 MIL/uL (ref 3.87–5.11)
RDW: 12.6 % (ref 11.5–15.5)
WBC: 10.1 10*3/uL (ref 4.0–10.5)
nRBC: 0 % (ref 0.0–0.2)

## 2022-02-18 LAB — COMPREHENSIVE METABOLIC PANEL
ALT: 17 U/L (ref 0–44)
AST: 20 U/L (ref 15–41)
Albumin: 4.2 g/dL (ref 3.5–5.0)
Alkaline Phosphatase: 52 U/L (ref 38–126)
Anion gap: 10 (ref 5–15)
BUN: 13 mg/dL (ref 6–20)
CO2: 19 mmol/L — ABNORMAL LOW (ref 22–32)
Calcium: 9 mg/dL (ref 8.9–10.3)
Chloride: 108 mmol/L (ref 98–111)
Creatinine, Ser: 0.89 mg/dL (ref 0.44–1.00)
GFR, Estimated: >60 mL/min (ref >60)
Glucose, Bld: 99 mg/dL (ref 70–99)
Potassium: 3.9 mmol/L (ref 3.5–5.1)
Sodium: 137 mmol/L (ref 135–145)
Total Bilirubin: 0.4 mg/dL (ref 0.3–1.2)
Total Protein: 6.8 g/dL (ref 6.5–8.1)

## 2022-02-18 LAB — I-STAT BETA HCG BLOOD, ED (MC, WL, AP ONLY): I-stat hCG, quantitative: <5 m[IU]/mL (ref <5)

## 2022-02-18 LAB — LIPASE, BLOOD: Lipase: 37 U/L (ref 11–51)

## 2022-02-18 LAB — URINALYSIS, ROUTINE W REFLEX MICROSCOPIC
Bilirubin Urine: NEGATIVE
Glucose, UA: NEGATIVE mg/dL
Hgb urine dipstick: NEGATIVE
Ketones, ur: 20 mg/dL — AB
Leukocytes,Ua: NEGATIVE
Nitrite: NEGATIVE
Protein, ur: NEGATIVE mg/dL
Specific Gravity, Urine: >1.046 — ABNORMAL HIGH (ref 1.005–1.030)
pH: 6 (ref 5.0–8.0)

## 2022-02-18 MED ORDER — OXYCODONE-ACETAMINOPHEN 5-325 MG PO TABS: 1.0000 | ORAL_TABLET | Freq: Four times a day (QID) | ORAL | 0 refills | Status: AC | PRN

## 2022-02-18 MED ORDER — HYDROMORPHONE HCL 1 MG/ML IJ SOLN
1.0000 mg | Freq: Once | INTRAMUSCULAR | Status: AC
  Administered 2022-02-18: 1 mg via INTRAVENOUS
  Filled 2022-02-18: qty 1

## 2022-02-18 MED ORDER — SODIUM CHLORIDE 0.9 % IV BOLUS
1000.0000 mL | Freq: Once | INTRAVENOUS | Status: AC
  Administered 2022-02-18: 1000 mL via INTRAVENOUS

## 2022-02-18 MED ORDER — ONDANSETRON HCL 4 MG/2ML IJ SOLN
4.0000 mg | Freq: Once | INTRAMUSCULAR | Status: AC
  Administered 2022-02-18: 4 mg via INTRAVENOUS
  Filled 2022-02-18: qty 2

## 2022-02-18 MED ORDER — FENTANYL CITRATE PF 50 MCG/ML IJ SOSY
50.0000 ug | PREFILLED_SYRINGE | Freq: Once | INTRAMUSCULAR | Status: AC
  Administered 2022-02-18: 50 ug via INTRAVENOUS
  Filled 2022-02-18: qty 1

## 2022-02-18 MED ORDER — PROMETHAZINE HCL 25 MG PO TABS: 25.0000 mg | ORAL_TABLET | Freq: Four times a day (QID) | ORAL | 0 refills | Status: AC | PRN

## 2022-02-18 MED ORDER — ONDANSETRON 4 MG PO TBDP
4.0000 mg | ORAL_TABLET | Freq: Once | ORAL | Status: AC
  Administered 2022-02-18: 4 mg via ORAL
  Filled 2022-02-18: qty 1

## 2022-02-18 MED ORDER — KETOROLAC TROMETHAMINE 15 MG/ML IJ SOLN
15.0000 mg | Freq: Once | INTRAMUSCULAR | Status: AC
  Administered 2022-02-18: 15 mg via INTRAVENOUS
  Filled 2022-02-18: qty 1

## 2022-02-18 MED ORDER — HYDROMORPHONE HCL 1 MG/ML IJ SOLN: 0.5000 mg | Freq: Once | INTRAMUSCULAR | Status: DC

## 2022-02-18 MED ORDER — OXYCODONE-ACETAMINOPHEN 5-325 MG PO TABS
1.0000 | ORAL_TABLET | Freq: Once | ORAL | Status: AC
  Administered 2022-02-18: 1 via ORAL
  Filled 2022-02-18: qty 1

## 2022-02-18 MED ORDER — IOHEXOL 350 MG/ML SOLN
75.0000 mL | Freq: Once | INTRAVENOUS | Status: AC | PRN
  Administered 2022-02-18: 75 mL via INTRAVENOUS

## 2022-02-18 MED ORDER — KETOROLAC TROMETHAMINE 10 MG PO TABS: 10.0000 mg | ORAL_TABLET | Freq: Four times a day (QID) | ORAL | 0 refills | Status: AC | PRN

## 2022-02-18 NOTE — ED Provider Triage Note (Signed)
Emergency Medicine Provider Triage Evaluation Note  Peggy [name] , a 36 y.o. female  was evaluated in triage.  Pt complains of right lower quadrant abdominal pain.  Began approximate 4 AM this morning.  Multiple episodes of NBNB emesis. Hx of endometriosis. Sharp pain. Denies change of pregnancy  Review of Systems  Positive: RLQ pain Negative:   Physical Exam  BP (!) 147/106   Pulse 91   Temp 98.1 F (36.7 C)   Resp 18   SpO2 100%  Gen:   Awake, appear uncomfortable Resp:  Normal effort  MSK:   Moves extremities without difficulty  ABD:  Significant tenderness to RLQ Other:    Medical Decision Making  Medically screening exam initiated at 11:01 AM.  Appropriate orders placed.  Peggy [name] was informed that the remainder of the evaluation will be completed by another provider, this initial triage assessment does not replace that evaluation, and the importance of remaining in the ED until their evaluation is complete.  RLQ pain   Ernestine Langworthy A, PA-C 02/18/22 1105

## 2022-02-18 NOTE — ED Triage Notes (Signed)
Pt with R sided abdominal pain onset this morning at 0400 along with emesis. Pt tearful in triage.

## 2022-02-18 NOTE — ED Provider Notes (Signed)
[facility] EMERGENCY DEPARTMENT Provider Note   CSN: [id number] Arrival date & time: 02/18/22  1049     History  Chief Complaint  Patient presents with   Abdominal Pain    ILANNA DEIHL is a 36 y.o. 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  Patient presents to ED for evaluation of abdominal pain.  Patient reports that this morning around 4 AM she was woken from her sleep with left lower quadrant abdominal pain.  Patient states that she has a history of endometriosis, has had pain like this before however not as severe.  The patient reports that because of her pain she also had nausea and vomiting.  Patient states that the pain is progressively worsened since waking this morning at 4 AM.  Patient reports the pain is now located in her left lower quadrant, right lower quadrant and the pain radiates to bilateral flanks.  Patient denies any dysuria, history of kidney stones.  Patient denies any fevers, diarrhea, constipation.  Patient denies any vaginal discharge.  Patient reports that she is in a monogamous relationship with her husband and they have had 4 children.   Abdominal Pain Associated symptoms: nausea and vomiting   Associated symptoms: no constipation, no diarrhea, no dysuria, no fever and no vaginal discharge        Home Medications Prior to Admission medications   Medication Sig Start Date End Date Taking? Authorizing Provider  ketorolac (TORADOL) 10 MG tablet Take 1 tablet (10 mg total) by mouth every 6 (six) hours as needed. 02/18/22  Yes Al Decant, PA-C  oxyCODONE-acetaminophen (PERCOCET/ROXICET) 5-325 MG tablet Take 1 tablet by mouth every 6 (six) hours as needed for severe pain. 02/18/22  Yes Al Decant, PA-C  promethazine (PHENERGAN) 25 MG tablet Take 1 tablet (25 mg total) by mouth every 6 (six) hours as needed for nausea or vomiting. 02/18/22  Yes Al Decant,  PA-C  enoxaparin (LOVENOX) 40 MG/0.4ML injection Inject 0.4 mLs (40 mg total) into the skin daily. 06/28/20 07/28/20  Waynard Reeds, MD  ibuprofen (ADVIL) 600 MG tablet Take 1 tablet (600 mg total) by mouth every 6 (six) hours as needed. 06/28/20   Waynard Reeds, MD  Prenatal Vit-Fe Fumarate-FA (PRENATAL MULTIVITAMIN) TABS tablet Take 1 tablet by mouth daily at 12 noon.    [provider]      Allergies    Caffeine and Peanut butter flavor    Review of Systems   Review of Systems  Constitutional:  Negative for fever.  Gastrointestinal:  Positive for abdominal pain, nausea and vomiting. Negative for constipation and diarrhea.  Genitourinary:  Negative for dysuria, flank pain and vaginal discharge.  All other systems reviewed and are negative.   Physical Exam Updated Vital Signs BP 108/73   Pulse 68   Temp 98.4 F (36.9 C) (Oral)   Resp 20   SpO2 96%  Physical Exam Vitals and nursing note reviewed.  Constitutional:      General: She is not in acute distress.    Appearance: She is well-developed. She is not ill-appearing, toxic-appearing or diaphoretic.  HENT:     Head: Normocephalic and atraumatic.     Nose: Nose normal.     Mouth/Throat:     Mouth: Mucous membranes are moist.     Pharynx: Oropharynx is clear.  Eyes:     Extraocular Movements: Extraocular movements intact.     Pupils: Pupils are equal, round,  and reactive to light.  Cardiovascular:     Rate and Rhythm: Normal rate and regular rhythm.  Pulmonary:     Effort: Pulmonary effort is normal.     Breath sounds: Normal breath sounds. No wheezing.  Abdominal:     General: Abdomen is flat. There is no distension.     Palpations: Abdomen is soft.     Tenderness: There is abdominal tenderness in the right lower quadrant, suprapubic area and left lower quadrant.  Musculoskeletal:     Cervical back: Normal range of motion and neck supple. No tenderness.  Skin:    General: Skin is warm and dry.     Capillary  Refill: Capillary refill takes less than 2 seconds.  Neurological:     Mental Status: She is alert and oriented to person, place, and time.     ED Results / Procedures / Treatments   Labs (all labs ordered are listed, but only abnormal results are displayed) Labs Reviewed  COMPREHENSIVE METABOLIC PANEL - Abnormal; Notable for the following components:      Result Value   CO2 19 (*)    All other components within normal limits  URINALYSIS, ROUTINE W REFLEX MICROSCOPIC - Abnormal; Notable for the following components:   Specific Gravity, Urine >1.046 (*)    Ketones, ur 20 (*)    All other components within normal limits  CBC WITH DIFFERENTIAL/PLATELET  LIPASE, BLOOD  I-STAT BETA HCG BLOOD, ED ([facility], [facility], [facility] ONLY)    EKG None  Radiology CT ABDOMEN PELVIS W CONTRAST  Result Date: 02/18/2022 CLINICAL DATA:  Right lower quadrant abdominal pain since 4 a.m., emesis EXAM: CT ABDOMEN AND PELVIS WITH CONTRAST TECHNIQUE: Multidetector CT imaging of the abdomen and pelvis was performed using the standard protocol following bolus administration of intravenous contrast. RADIATION DOSE REDUCTION: This exam was performed according to the departmental dose-optimization program which includes automated exposure control, adjustment of the mA and/or kV according to patient size and/or use of iterative reconstruction technique. CONTRAST:  40mL OMNIPAQUE IOHEXOL 350 MG/ML SOLN COMPARISON:  01/02/2017, 02/18/2022 FINDINGS: Lower chest: Dependent consolidation within the lower lobes consistent with hypoventilatory change. No acute pleural or parenchymal lung disease. Hepatobiliary: No focal liver abnormality is seen. Status post cholecystectomy. No biliary dilatation. Pancreas: Unremarkable. No pancreatic ductal dilatation or surrounding inflammatory changes. Spleen: Normal in size without focal abnormality. Adrenals/Urinary Tract: Adrenal glands are unremarkable. Kidneys are normal, without renal calculi, focal  lesion, or hydronephrosis. Bladder is unremarkable. Stomach/Bowel: No bowel obstruction or ileus. The appendix is difficult to visualize, but is likely normal in a retrocecal location based on coronal images. No bowel wall thickening or inflammatory change. Vascular/Lymphatic: Filling defect is seen within the right gonadal vein, consistent with nonocclusive right gonadal vein thrombosis. No other significant vascular findings. No pathologic adenopathy within the abdomen or pelvis. Reproductive: Heterogeneous appearance of the uterus may reflect underlying fibroids. Please refer to preceding ultrasound evaluation detailing endometrial findings which may require follow-up hysteroscopy or endometrial biopsy. There are inflammatory changes within the right adnexa, with possibly loculated fluid measuring up to 2.6 x 2.0 cm. By report, the patient had prior salpingectomy. The findings are most consistent with pelvic inflammatory disease, and underlying abscess cannot be entirely excluded. Please correlate with physical exam findings and laboratory evaluation. 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 The left adnexa is unremarkable. Other: Trace free fluid within the pelvis. No free  intraperitoneal gas. No abdominal wall hernia. Musculoskeletal: No acute or destructive bony lesions. Reconstructed images demonstrate no additional findings. IMPRESSION: 1. Inflammatory changes in the right adnexa, concerning for pelvic inflammatory disease. By report, the patient has had a prior salpingectomy, though the imaging appearance is still suggestive of underlying tubo-ovarian abscess. Please correlate with physical exam findings and laboratory evaluation. 2. Incidental nonocclusive right canal vein thrombus, of uncertain clinical significance. 3. Heterogeneous appearance of the uterus consistent with  fibroids. Please refer to preceding ultrasound report describing endometrial findings which may require follow-up hysteroscopy or endometrial biopsy. Electronically Signed   By: Sharlet SalinaMichael  Brown M.D.   On: 02/18/2022 17:37   US PELVIC COMPLETE W TRANSVAGINAL AND TORSION R/O  Result Date: 02/18/2022 CLINICAL DATA:  Pelvic pain. Patient reports a history of a prior C-section and bilateral fallopian to removal. EXAM: TRANSABDOMINAL AND TRANSVAGINAL ULTRASOUND OF PELVIS DOPPLER ULTRASOUND OF OVARIES TECHNIQUE: Both transabdominal and transvaginal ultrasound examinations of the pelvis were performed. Transabdominal technique was performed for global imaging of the pelvis including uterus, ovaries, adnexal regions, and pelvic cul-de-sac. It was necessary to proceed with endovaginal exam following the transabdominal exam to visualize the uterus, endometrium and ovaries to better advantage. Color and duplex Doppler ultrasound was utilized to evaluate blood flow to the ovaries. COMPARISON:  None Available. FINDINGS: Study mildly limited due to patient motion during exam. Uterus Measurements: 7.7 x 4.7 x 7.3 cm. no fibroids or other mass visualized. Endometrium Thickness: Focal masslike area of increased echogenicity containing a small cystic component lies in the mid uterine segment, measuring 12 mm in thickness. This is of unclear etiology. Right ovary Measurements: 2.2 x 2.4 x 2.4 cm = volume: 9.6 mL. Normal appearance/no adnexal mass. Left ovary Measurements: 2.6 x 1.8 x 1.5 cm = volume: 3.7 mL. Normal appearance/no adnexal mass. Pulsed Doppler evaluation of both ovaries demonstrates normal low-resistance arterial and venous waveforms. Other findings No abnormal free fluid. IMPRESSION: 1. No acute findings. No evidence of ovarian torsion. No findings to account for pelvic pain. 2. Focal, round echogenic area along the endometrium of the mid uterine segment, the contains a small cystic component. This is potentially a  polyp. It would be reasonable, if there is any possibility of pregnancy, to correlate with a pregnancy test. A focal endometrial lesion is suspected. Consider sonohysterogram for further evaluation, prior to hysteroscopy or endometrial biopsy. 3. No other abnormality. Electronically Signed   By: Amie Portlandavid  Ormond M.D.   On: 02/18/2022 11:57    Procedures Procedures   Medications Ordered in ED Medications  oxyCODONE-acetaminophen (PERCOCET/ROXICET) 5-325 MG per tablet 1 tablet (1 tablet Oral Given 02/18/22 1059)  ondansetron (ZOFRAN-ODT) disintegrating tablet 4 mg (4 mg Oral Given 02/18/22 1059)  ondansetron (ZOFRAN) injection 4 mg (4 mg Intravenous Given 02/18/22 1634)  fentaNYL (SUBLIMAZE) injection 50 mcg (50 mcg Intravenous Given 02/18/22 1636)  iohexol (OMNIPAQUE) 350 MG/ML injection 75 mL (75 mLs Intravenous Contrast Given 02/18/22 1713)  HYDROmorphone (DILAUDID) injection 1 mg (1 mg Intravenous Given 02/18/22 1732)  sodium chloride 0.9 % bolus 1,000 mL (0 mLs Intravenous Stopped 02/18/22 2200)  HYDROmorphone (DILAUDID) injection 1 mg (1 mg Intravenous Given 02/18/22 1845)  HYDROmorphone (DILAUDID) injection 1 mg (1 mg Intravenous Given 02/18/22 1947)  ondansetron (ZOFRAN) injection 4 mg (4 mg Intravenous Given 02/18/22 1947)  ketorolac (TORADOL) 15 MG/ML injection 15 mg (15 mg Intravenous Given 02/18/22 2051)    ED Course/ Medical Decision Making/ A&P  Medical Decision Making Risk Prescription drug management.   36 year old female presents to the ED for evaluation of lower abdominal pain, please see HPI for further details.  On examination the patient is very uncomfortable appearing in bed.  The patient is afebrile, nontachycardic.  The patient lung sounds are clear bilaterally, she is not hypoxic.  Patient abdomen has tenderness in the right lower quadrant, suprapubic area, left lower quadrant.  Patient neurological examination shows no focal  neurodeficits.  Patient work-up initiated in triage includes urinalysis, CMP, lipase, i-STAT beta-hCG, CBC, pelvic ultrasound, CT abdomen pelvis with contrast.  Patient received pain medication in triage to include 5 mg oxycodone, 4 mg Zofran for nausea.  Patient work-up has been unremarkable thus far.  The patient CBC is unremarkable, there is no leukocytosis.  The patient CMP is unremarkable.  The patient lipase is not elevated.  The patient i-STAT beta-hCG is negative.  Patient urinalysis is unremarkable.  Patient ultrasound shows no acute findings and no evidence of ovarian torsion.  There is no findings to account for the patient's pelvic pain.  There is a focal round echogenic area along the endometrium of the mid uterine segment that contains a small cystic component.  Potentially also a polyp.  Patient CT scan shows inflammatory changes in the right adnexa which is concerning for PID. Patient also has reported past salpingectomy although the imaging is still suggestive of an underlying tubo-ovarian abscess.  The patient urinalysis shows no leukocytes, the patient denies any vaginal discharge, the patient is not febrile, the patient does not have any kind of leukocytosis. There is also heterogenous appearance of uterus consistent with fibroids.  I discussed this patient and her case with Dr. Donavan Foil of OB/GYN who stated that this was most likely an endometrial flare.  Dr. Donavan Foil stated that there was no concern for PID in this patient based on lack of clinical or lab findings.  Dr. Donavan Foil advised the patient to follow-up with her OB/GYN.  Patient pain was hard to control.  The patient initially was given 5 mg oxycodone, 4 mg Zofran in triage.  When the patient was brought back to her room, she was moaning in pain.  The patient was provided an additional 50 mcg of fentanyl at this time.  After this was administered, the patient called out to the nurse requesting more pain medication.  Patient was  provided 1 mg Dilaudid at this time.  Patient was taken to CT scanner.  When patient returned from CT scanner, she stated that she was nauseous and in more pain.  At this time, 4 mg Zofran, 1 mg Dilaudid was administered.  After 45 minutes, the patient continued to complain of pain.  1 mg of Dilaudid was administered.  Patient continued to complain of pain, 15 mg of Toradol was administered at this time.  At this time, I called for admission due to pain control, Dr. Despina Hidden of OB/GYN stated that at this time they would not be able to offer admission for pain control.  Dr. Despina Hidden advised to have this patient follow-up with her OB/GYN.  He advised to send the patient home with pain medication.  He again voiced that due to lack of clinical findings, lack of laboratory findings, the chances of this being PID or a developing tubo-ovarian abscess are very low.  He agreed with Dr. Laverna Peace assessment.  Patient was provided return precautions and she voiced understanding.  The patient was advised to follow-up with her OB/GYN for further management.  Patient was sent home with Percocet, Phenergan, Toradol.  Patient given return precautions and she voiced understanding.  Patient had all of her questions answered to her satisfaction.  The patient stable for discharge.  Final Clinical Impression(s) / ED Diagnoses Final diagnoses:  Endometriosis    Rx / DC Orders ED Discharge Orders          Ordered    promethazine (PHENERGAN) 25 MG tablet  Every 6 hours PRN        02/18/22 2159    oxyCODONE-acetaminophen (PERCOCET/ROXICET) 5-325 MG tablet  Every 6 hours PRN        02/18/22 2159    ketorolac (TORADOL) 10 MG tablet  Every 6 hours PRN        02/18/22 2159              Al Decant, PA-C 02/18/22 2240    Vanetta Mulders, MD 02/21/22 [phone]

## 2022-02-18 NOTE — ED Notes (Signed)
Pt continues to complain of 10/10 abdominal pain and states that is is spreading across her abdomen to the right side now as well. Provider notified

## 2022-02-18 NOTE — ED Notes (Signed)
Pt denied to give urine sample as soon as I asked.

## 2022-02-18 NOTE — ED Notes (Signed)
Pt provided heat pack for comfort

## 2022-02-18 NOTE — Discharge Instructions (Addendum)
Return to the ED with any new or worsening signs or symptoms Please continue taking medication I prescribed you.  Please be aware that pain medication will constipate you.  Please purchase MiraLAX to prevent this recurring. Please follow-up with OB/GYN Please read attached guide concerning endometriosis

## 2023-11-25 ENCOUNTER — Ambulatory Visit: Admitting: Physician Assistant
